# Patient Record
Sex: Female | Born: 1945
Health system: Southern US, Community
[De-identification: ages and names within clinical notes are randomized; demographics above are authoritative.]

## PROBLEM LIST (undated history)

## (undated) DIAGNOSIS — E785 Hyperlipidemia, unspecified: Secondary | ICD-10-CM

## (undated) DIAGNOSIS — H3320 Serous retinal detachment, unspecified eye: Secondary | ICD-10-CM

## (undated) DIAGNOSIS — Z972 Presence of dental prosthetic device (complete) (partial): Secondary | ICD-10-CM

## (undated) DIAGNOSIS — R413 Other amnesia: Secondary | ICD-10-CM

## (undated) DIAGNOSIS — M810 Age-related osteoporosis without current pathological fracture: Secondary | ICD-10-CM

## (undated) DIAGNOSIS — M720 Palmar fascial fibromatosis [Dupuytren]: Secondary | ICD-10-CM

## (undated) DIAGNOSIS — E119 Type 2 diabetes mellitus without complications: Secondary | ICD-10-CM

## (undated) DIAGNOSIS — F329 Major depressive disorder, single episode, unspecified: Secondary | ICD-10-CM

## (undated) HISTORY — PX: CATARACT EXTRACTION: SUR2

## (undated) HISTORY — PX: ABDOMINAL HYSTERECTOMY: SHX81

## (undated) HISTORY — DX: Serous retinal detachment, unspecified eye: H33.20

## (undated) HISTORY — PX: TONSILLECTOMY: SUR1361

## (undated) HISTORY — PX: TUBAL LIGATION: SHX77

## (undated) HISTORY — PX: COSMETIC SURGERY: SHX468

## (undated) HISTORY — PX: PALMAR FASCIECTOMY: SUR599

## (undated) HISTORY — PX: IM NAILING TIBIA: SUR734

## (undated) HISTORY — PX: OTHER SURGICAL HISTORY: SHX169

## (undated) HISTORY — PX: EYE SURGERY: SHX253

---

## 2004-05-03 ENCOUNTER — Ambulatory Visit: Payer: Self-pay | Admitting: General Practice

## 2004-08-07 ENCOUNTER — Ambulatory Visit: Payer: Self-pay | Admitting: Family Medicine

## 2004-09-20 ENCOUNTER — Ambulatory Visit: Payer: Self-pay | Admitting: Unknown Physician Specialty

## 2006-07-13 ENCOUNTER — Ambulatory Visit: Payer: Self-pay | Admitting: Family Medicine

## 2007-07-15 ENCOUNTER — Ambulatory Visit: Payer: Self-pay | Admitting: Family Medicine

## 2008-08-15 ENCOUNTER — Ambulatory Visit: Payer: Self-pay | Admitting: Family Medicine

## 2009-07-07 HISTORY — PX: BREAST BIOPSY: SHX20

## 2009-08-16 ENCOUNTER — Ambulatory Visit: Payer: Self-pay | Admitting: Family Medicine

## 2010-11-05 ENCOUNTER — Observation Stay (HOSPITAL_COMMUNITY)
Admission: RE | Admit: 2010-11-05 | Discharge: 2010-11-06 | Disposition: A | Discharge status: Home / Self Care | Payer: BC Managed Care – PPO | Source: Ambulatory Visit | Attending: Ophthalmology | Admitting: Ophthalmology

## 2010-11-05 DIAGNOSIS — H33009 Unspecified retinal detachment with retinal break, unspecified eye: Principal | ICD-10-CM | POA: Insufficient documentation

## 2010-11-05 LAB — CBC
HCT: 42.7 % (ref 36.0–46.0)
Hemoglobin: 14 g/dL (ref 12.0–15.0)
MCH: 30.8 pg (ref 26.0–34.0)
MCHC: 32.8 g/dL (ref 30.0–36.0)

## 2010-11-05 LAB — BASIC METABOLIC PANEL
CO2: 31 mEq/L (ref 19–32)
Calcium: 9.2 mg/dL (ref 8.4–10.5)
Chloride: 106 mEq/L (ref 96–112)
Glucose, Bld: 119 mg/dL — ABNORMAL HIGH (ref 70–99)
Sodium: 142 mEq/L (ref 135–145)

## 2010-11-08 NOTE — Op Note (Signed)
  Terry Watson, Terry Watson               ACCOUNT NO.:  [id number]  MEDICAL RECORD NO.:  [id number]           PATIENT TYPE:  O  LOCATION:  [facility]                         FACILITY:  [facility]  PHYSICIAN:  Afton Lavalle D. Ashley Royalty, M.D. DATE OF BIRTH:  1946/06/04  DATE OF PROCEDURE:  11/05/2010 DATE OF DISCHARGE:                              OPERATIVE REPORT   ADMISSION DIAGNOSIS:  Rhegmatogenous retinal detachment in the right eye.  PROCEDURE:  Scleral buckle right eye, retinal photocoagulation right eye, gas fluid exchange right eye.  SURGEON:  Beulah Gandy. Ashley Royalty, MD  ASSISTANT:  Rosalie Doctor, MA  ANESTHESIA:  General.  DETAILS:  After usual prep and drape, 360 degree limbal peritomy isolation of four rectus muscles on 2-0 silk, localization of break at 2:30 o'clock, scleral dissection for 360 degrees to admit a #279 intrascleral implant.  Diathermy placed in the bed, 270 buckle was placed around the eye with a 508 G radial segment at 2 o'clock, 240 band placed around the eye with a 270 sleeve at 10 o'clock.  The buckle was jointed at 4 o'clock in the area where there was minimal detachment. Perforation site was chosen in the posterior aspect of the bed at 1 o'clock.  A large amount of clear colorless subretinal fluid came forth in a controlled manner.  C3F8 50% was injected into the vitreous cavity, as the fluid egressed, 1.2 mL was injected.  The buckle elements were placed and the band was adjusted.  Indirect ophthalmoscopy showed the retina to be lying nicely on the scleral buckle with minimal subretinal fluid remaining.  The indirect ophthalmoscope laser was moved into place, 1385 burns were placed around the retinal periphery with a power of 500 milliwatts, 1000 microns each at 0.1 seconds each.  The buckle was adjusted and trimmed.  The band was adjusted and trimmed.  Scleral sutures were knotted and the free ends removed.  The conjunctiva was reapproximated with 7-0 chromic suture.   Polymyxin and gentamicin were irrigated into [place] space.  Atropine solution was applied.  Marcaine was injected around the globe for postop pain.  Paracentesis x2 obtained a closing pressure of 15 with a Barraquer tonometer, Decadron 10 mg was injected into the lower subconjunctival space.  Polysporin ophthalmic ointment, a patch and shield were placed.  COMPLICATIONS:  None.  DURATION:  Two hours.  The patient was awakened and taken to recovery in satisfactory condition.     Beulah Gandy. Ashley Royalty, M.D.     JDM/MEDQ  D:  11/05/2010  T:  11/06/2010  Job:  [id number]  Electronically Signed by Alan Mulder M.D. on 11/08/2010 10:26:01 PM

## 2010-11-13 ENCOUNTER — Ambulatory Visit: Payer: Self-pay | Admitting: Family Medicine

## 2010-11-15 ENCOUNTER — Ambulatory Visit: Payer: Self-pay | Admitting: Family Medicine

## 2011-02-12 ENCOUNTER — Encounter (INDEPENDENT_AMBULATORY_CARE_PROVIDER_SITE_OTHER): Payer: BC Managed Care – PPO | Admitting: Ophthalmology

## 2011-02-12 DIAGNOSIS — H2510 Age-related nuclear cataract, unspecified eye: Secondary | ICD-10-CM

## 2011-02-12 DIAGNOSIS — H33009 Unspecified retinal detachment with retinal break, unspecified eye: Secondary | ICD-10-CM

## 2011-02-12 DIAGNOSIS — H43819 Vitreous degeneration, unspecified eye: Secondary | ICD-10-CM

## 2011-05-21 ENCOUNTER — Ambulatory Visit: Payer: Self-pay | Admitting: General Surgery

## 2011-06-18 ENCOUNTER — Encounter (INDEPENDENT_AMBULATORY_CARE_PROVIDER_SITE_OTHER): Payer: BC Managed Care – PPO | Admitting: Ophthalmology

## 2011-06-18 DIAGNOSIS — H43819 Vitreous degeneration, unspecified eye: Secondary | ICD-10-CM

## 2011-06-18 DIAGNOSIS — H2510 Age-related nuclear cataract, unspecified eye: Secondary | ICD-10-CM

## 2011-06-18 DIAGNOSIS — H3530 Unspecified macular degeneration: Secondary | ICD-10-CM

## 2011-06-18 DIAGNOSIS — H33009 Unspecified retinal detachment with retinal break, unspecified eye: Secondary | ICD-10-CM

## 2011-06-18 DIAGNOSIS — H35359 Cystoid macular degeneration, unspecified eye: Secondary | ICD-10-CM

## 2011-07-30 ENCOUNTER — Encounter (INDEPENDENT_AMBULATORY_CARE_PROVIDER_SITE_OTHER): Payer: BC Managed Care – PPO | Admitting: Ophthalmology

## 2011-07-30 DIAGNOSIS — H33009 Unspecified retinal detachment with retinal break, unspecified eye: Secondary | ICD-10-CM

## 2011-07-30 DIAGNOSIS — H35359 Cystoid macular degeneration, unspecified eye: Secondary | ICD-10-CM

## 2011-07-30 DIAGNOSIS — H43819 Vitreous degeneration, unspecified eye: Secondary | ICD-10-CM

## 2011-07-30 DIAGNOSIS — H2510 Age-related nuclear cataract, unspecified eye: Secondary | ICD-10-CM

## 2011-07-30 DIAGNOSIS — H35379 Puckering of macula, unspecified eye: Secondary | ICD-10-CM

## 2011-08-13 ENCOUNTER — Ambulatory Visit (INDEPENDENT_AMBULATORY_CARE_PROVIDER_SITE_OTHER): Payer: BC Managed Care – PPO | Admitting: Ophthalmology

## 2011-09-29 ENCOUNTER — Encounter (INDEPENDENT_AMBULATORY_CARE_PROVIDER_SITE_OTHER): Payer: BC Managed Care – PPO | Admitting: Ophthalmology

## 2011-09-29 DIAGNOSIS — H35359 Cystoid macular degeneration, unspecified eye: Secondary | ICD-10-CM

## 2011-09-29 DIAGNOSIS — H33009 Unspecified retinal detachment with retinal break, unspecified eye: Secondary | ICD-10-CM

## 2011-09-29 DIAGNOSIS — H2510 Age-related nuclear cataract, unspecified eye: Secondary | ICD-10-CM

## 2011-09-29 DIAGNOSIS — H43819 Vitreous degeneration, unspecified eye: Secondary | ICD-10-CM

## 2012-01-16 ENCOUNTER — Ambulatory Visit: Payer: Self-pay | Admitting: Family Medicine

## 2012-01-20 ENCOUNTER — Ambulatory Visit: Payer: Self-pay | Admitting: Family Medicine

## 2012-02-05 HISTORY — PX: BREAST CYST ASPIRATION: SHX578

## 2012-03-01 ENCOUNTER — Encounter (INDEPENDENT_AMBULATORY_CARE_PROVIDER_SITE_OTHER): Payer: BC Managed Care – PPO | Admitting: Ophthalmology

## 2012-03-01 DIAGNOSIS — H35379 Puckering of macula, unspecified eye: Secondary | ICD-10-CM

## 2012-03-01 DIAGNOSIS — H43819 Vitreous degeneration, unspecified eye: Secondary | ICD-10-CM

## 2012-03-01 DIAGNOSIS — H2510 Age-related nuclear cataract, unspecified eye: Secondary | ICD-10-CM

## 2012-03-01 DIAGNOSIS — H35359 Cystoid macular degeneration, unspecified eye: Secondary | ICD-10-CM

## 2012-03-01 DIAGNOSIS — H33009 Unspecified retinal detachment with retinal break, unspecified eye: Secondary | ICD-10-CM

## 2012-05-05 DIAGNOSIS — M720 Palmar fascial fibromatosis [Dupuytren]: Secondary | ICD-10-CM | POA: Insufficient documentation

## 2012-08-02 DIAGNOSIS — E785 Hyperlipidemia, unspecified: Secondary | ICD-10-CM | POA: Insufficient documentation

## 2012-09-12 IMAGING — MG MAM DGTL SCREENING MAMMO W/CAD
1 series · 4 of 4 positions shown · non-contrast
Comparison: none

REASON FOR EXAM: SCR
COMMENTS:

[R CC · right · 4 of 4 slices shown]
[im 1/4]
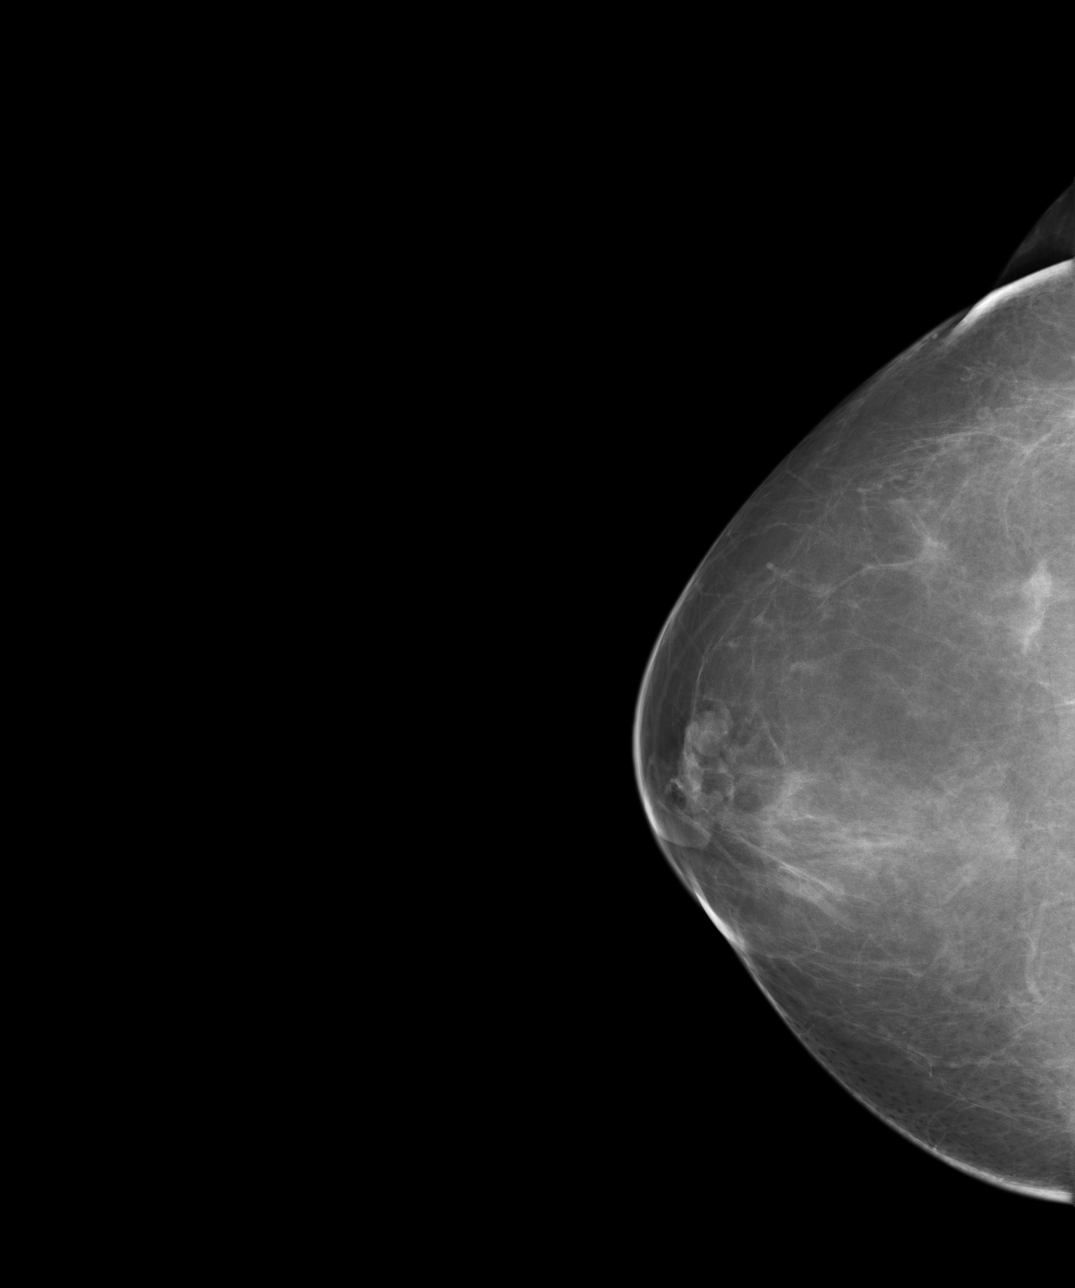
[im 2/4]
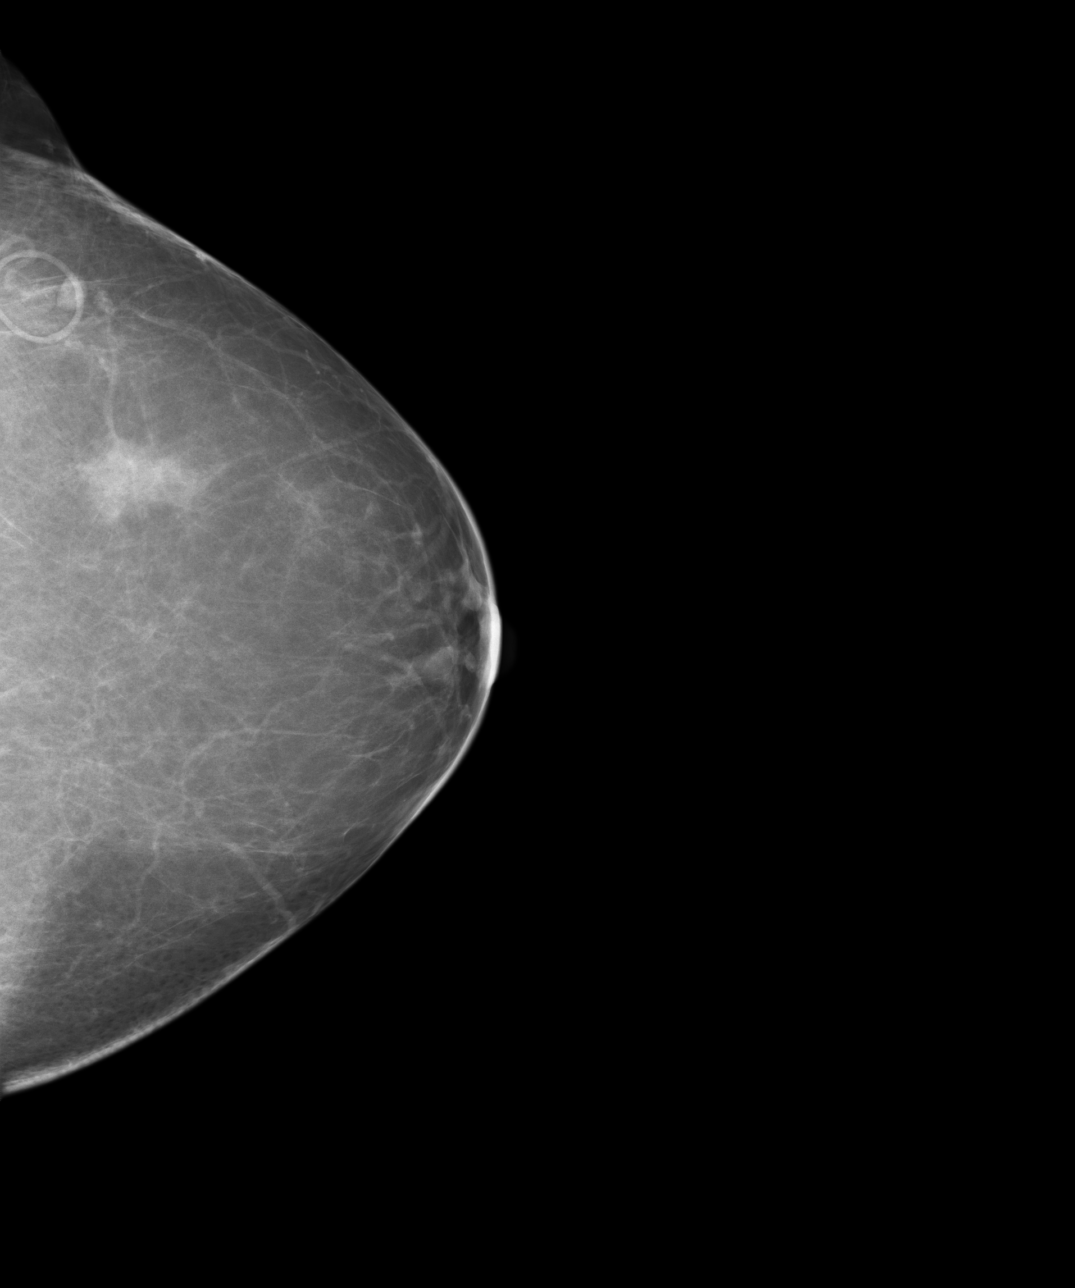
[im 3/4]
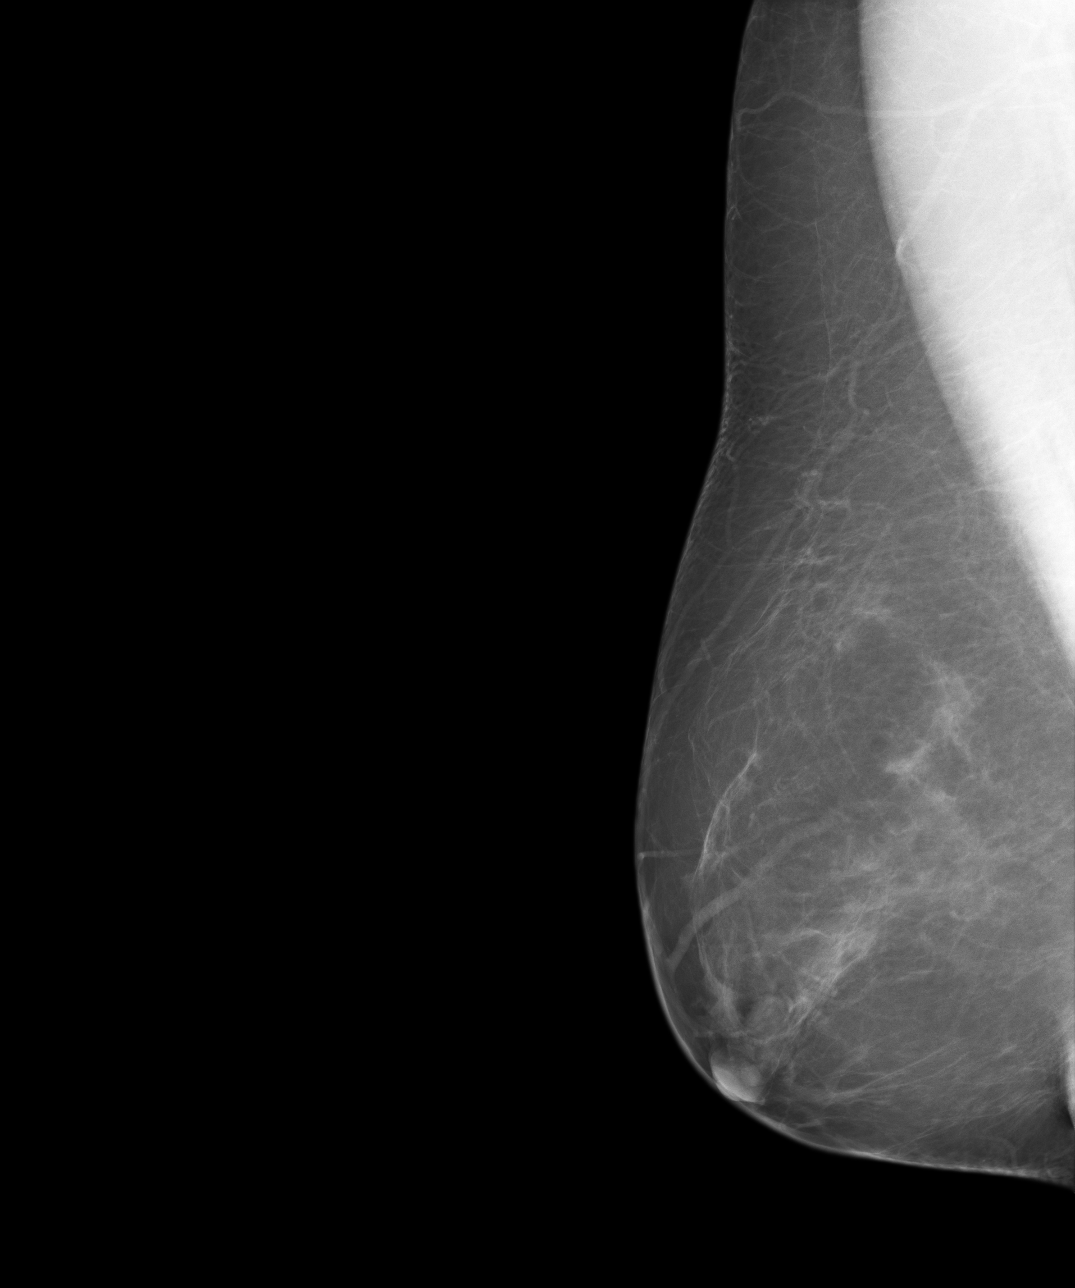
[im 4/4]
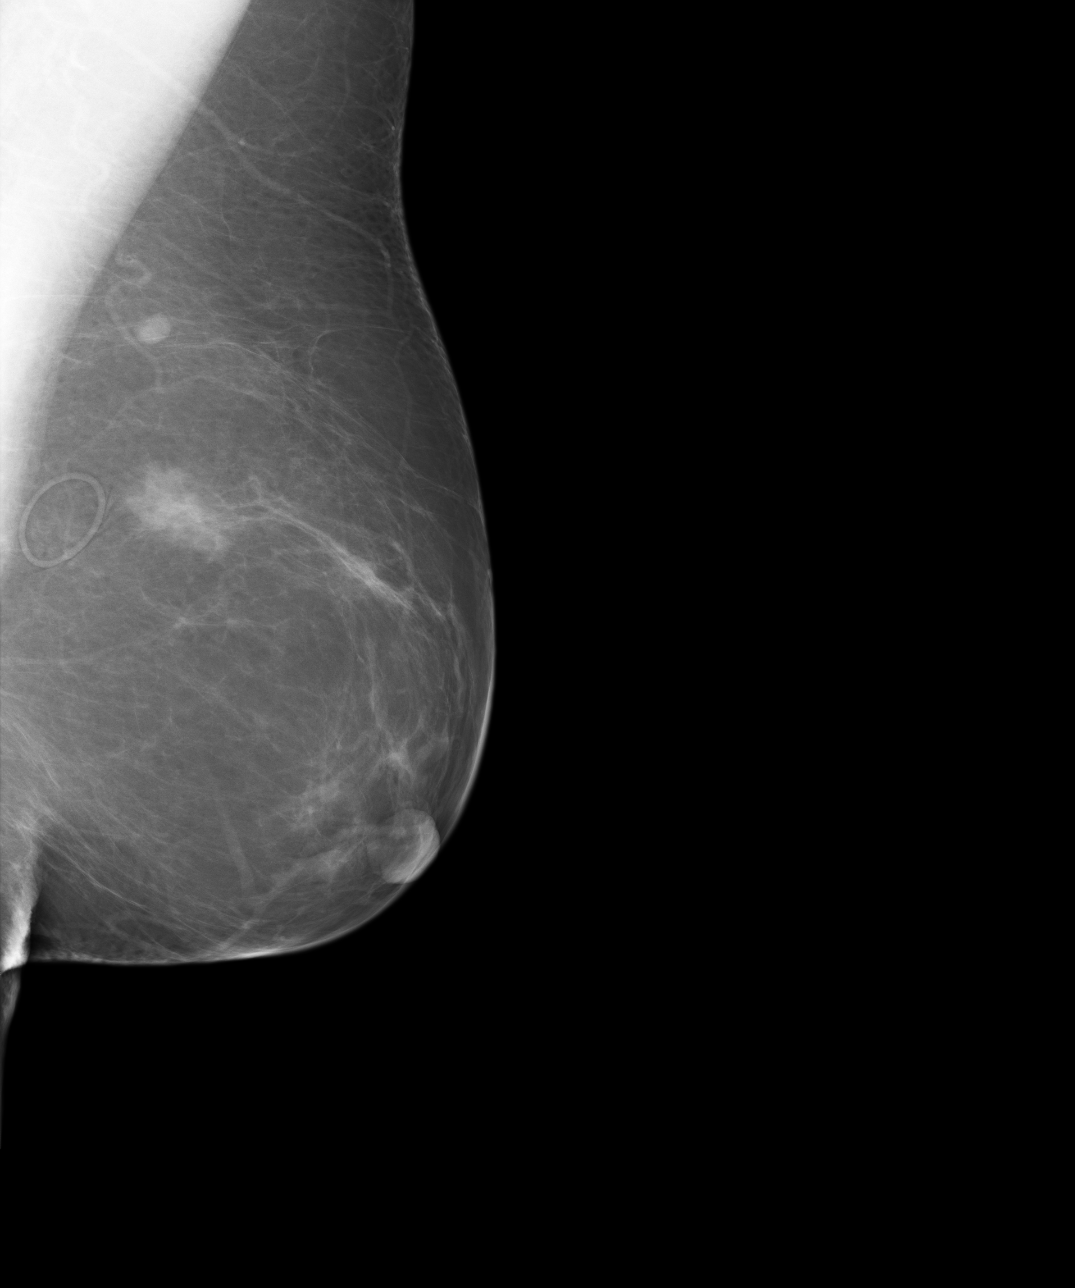

[4 of 4 positions shown; findings below may reference images not displayed]

PROCEDURE:     MAM - MAM DGTL SCREENING MAMMO W/CAD  - November 13, 2010  [DATE]

RESULT:     Changes of scarring are noted in the upper outer aspect of the
left breast. Nodular density is noted in the retroareolar portion of the
right breast. Compression spot films and ultrasound of the retroareolar
portion of the right breast is suggested for further evaluation. No
pathologic calcifications noted.
IMPRESSION: 1.      Rounded nodular density noted in the retroareolar portion of the
right breast for which compression spot films and, if need be, ultrasound
suggested for further evaluation.
2.     Changes consistent with scarring noted in the left breast.

BI-RADS:  Category 0 - Needs Additional Imaging Evaluation

Thank you for this opportunity to contribute to the care of your patient.

A NEGATIVE MAMMOGRAM REPORT DOES NOT PRECLUDE BIOPSY OR OTHER EVALUATION OF
A CLINICALLY PALPABLE OR OTHERWISE SUSPICIOUS MASS OR LESION. BREAST CANCER
MAY NOT BE DETECTED BY MAMMOGRAPHY IN UP TO 10% OF CASES.

## 2012-09-14 IMAGING — US ULTRASOUND RIGHT BREAST
1 series · 17 of 25 positions shown · non-contrast
Comparison: none

REASON FOR EXAM: AV RT ROUNDED NODULAR DENSITY
COMMENTS:

[Series 1: ultrasound right breast · 17 of 25 slices shown]
[im 1/25]
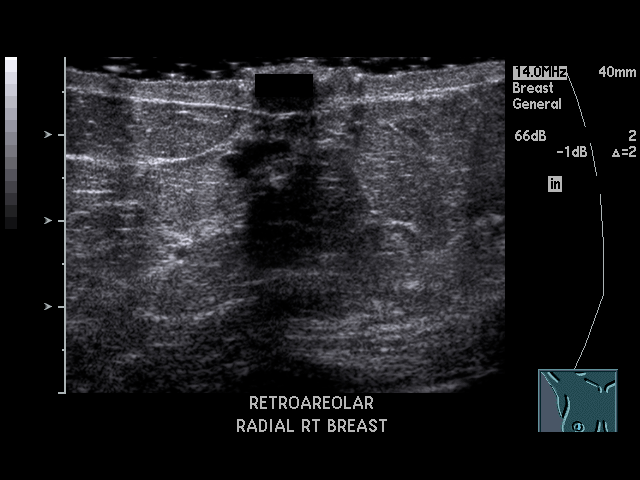
[im 3/25]
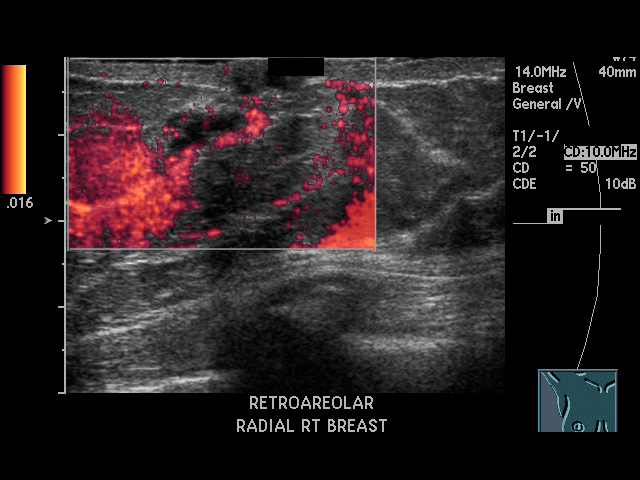
[im 4/25]
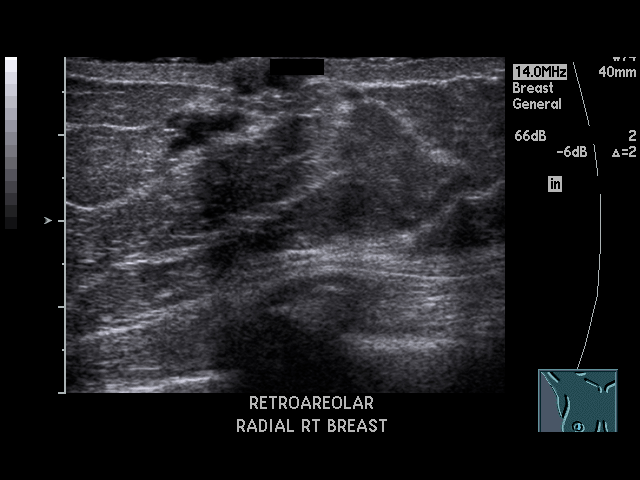
[im 6/25]
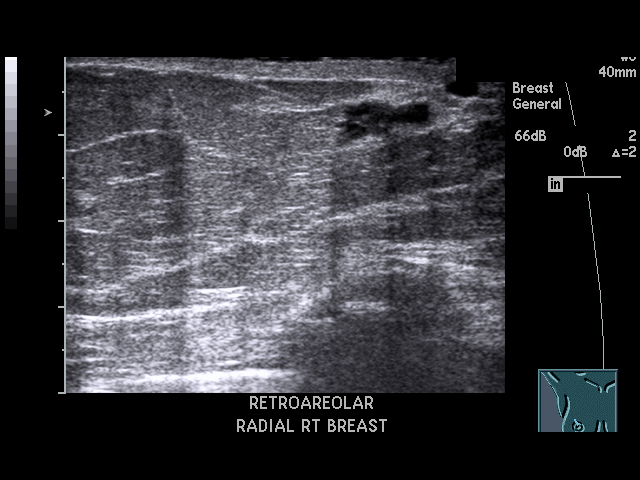
[im 7/25]
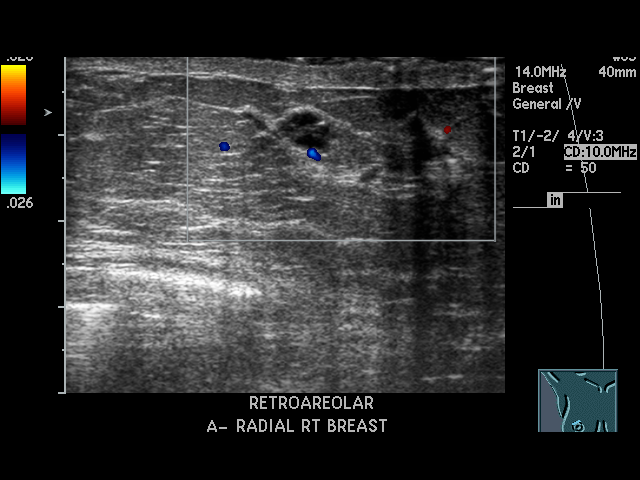
[im 9/25]
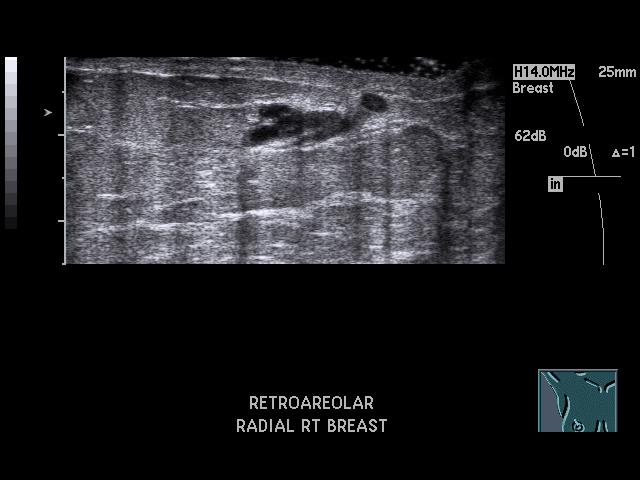
[im 10/25]
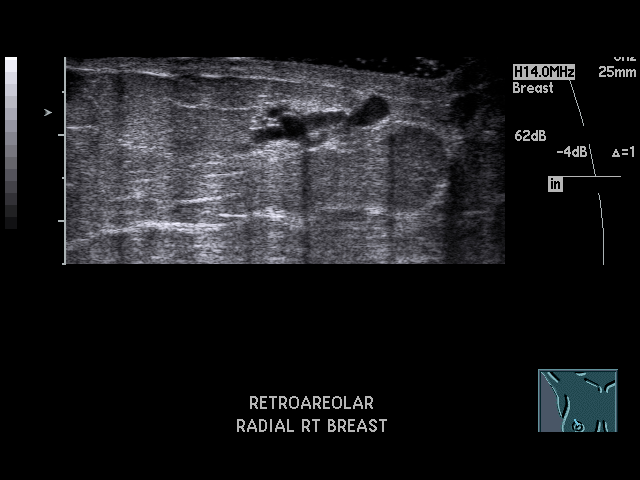
[im 12/25]
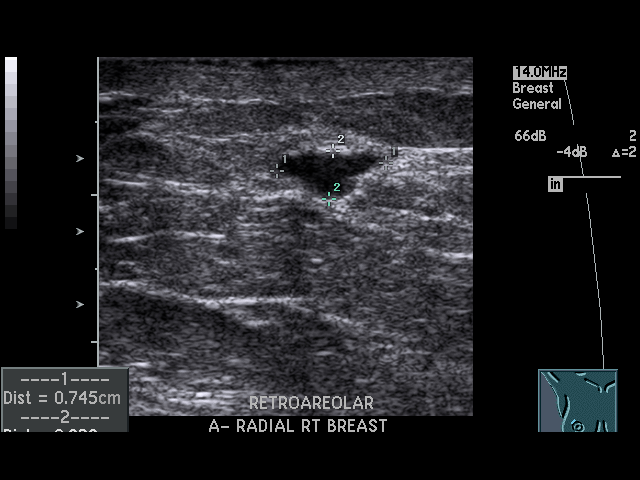
[im 13/25]
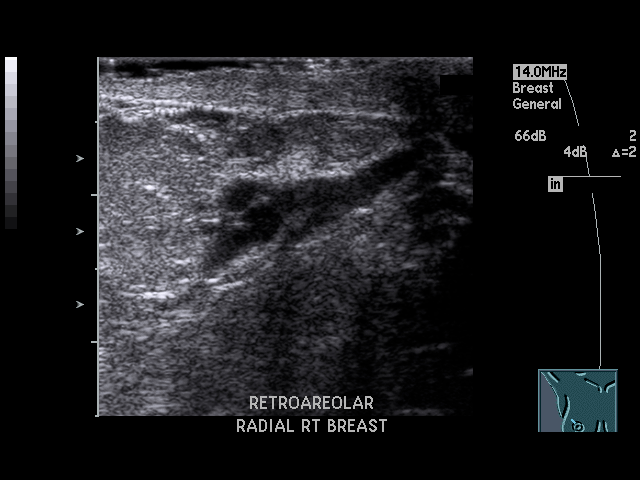
[im 14/25]
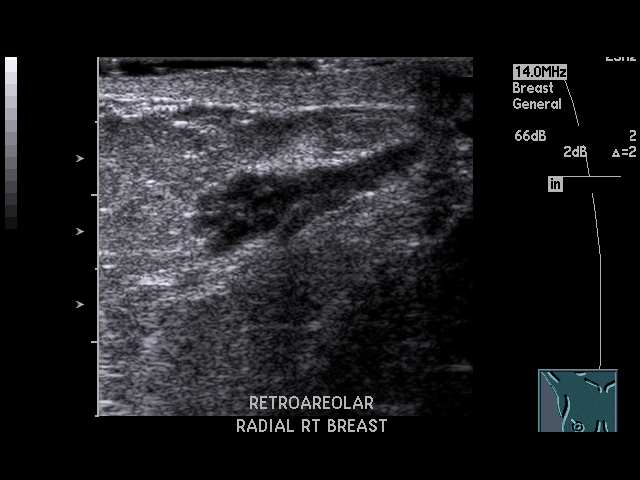
[im 16/25]
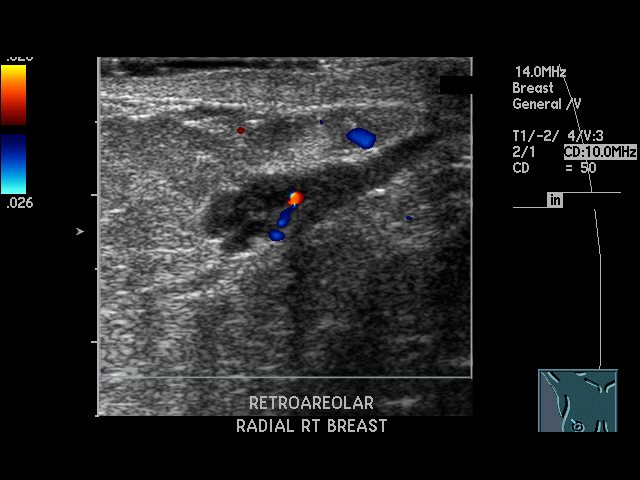
[im 17/25]
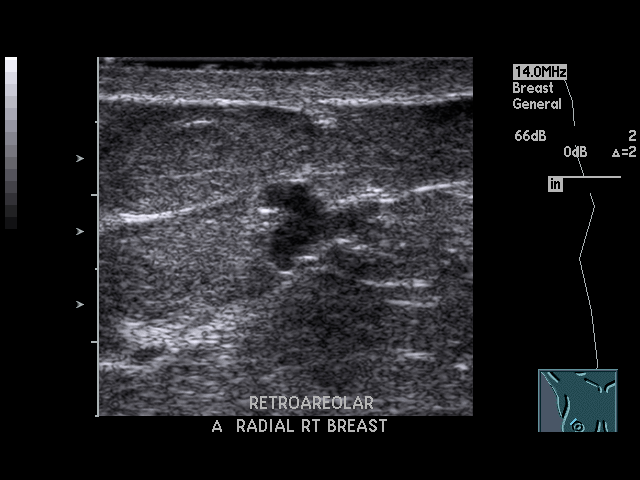
[im 19/25]
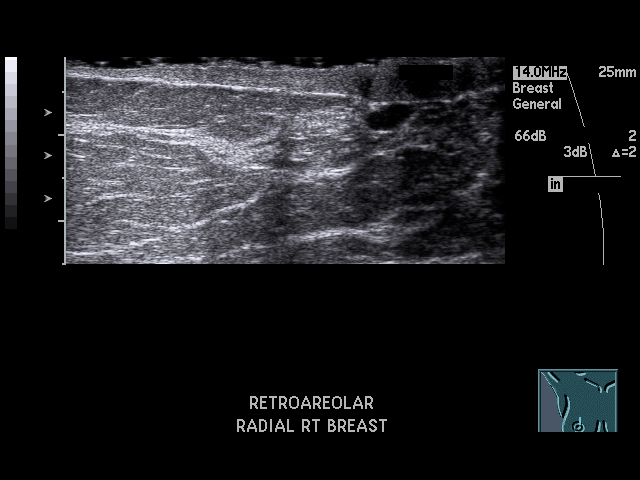
[im 20/25]
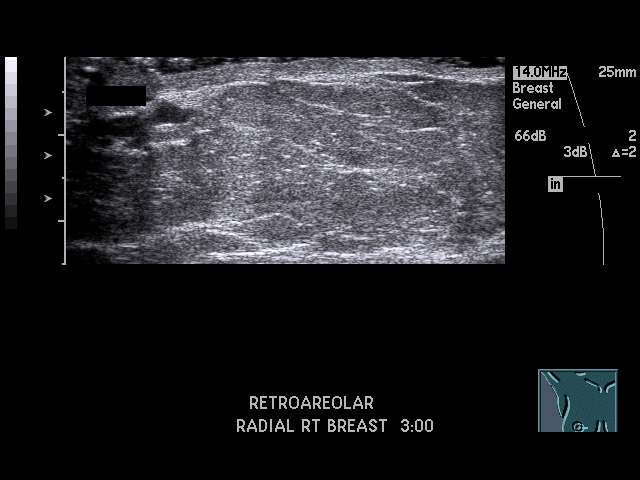
[im 22/25]
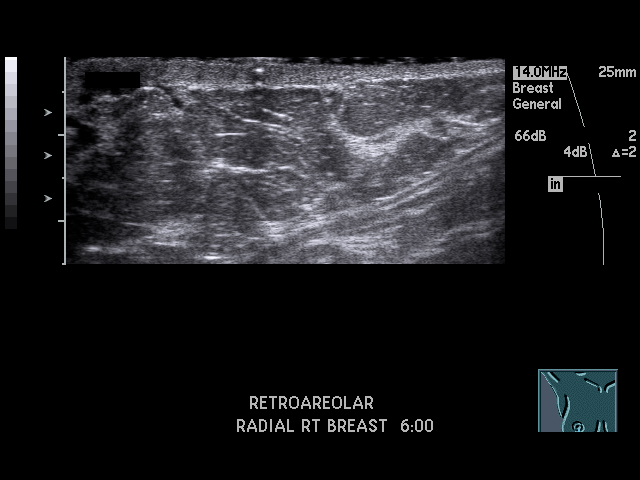
[im 23/25]
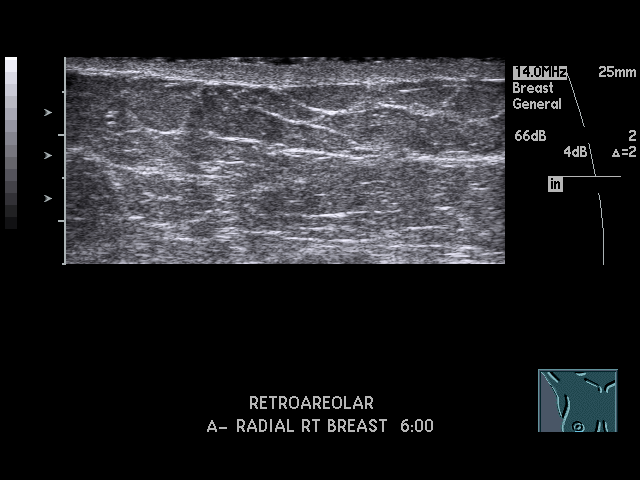
[im 25/25]
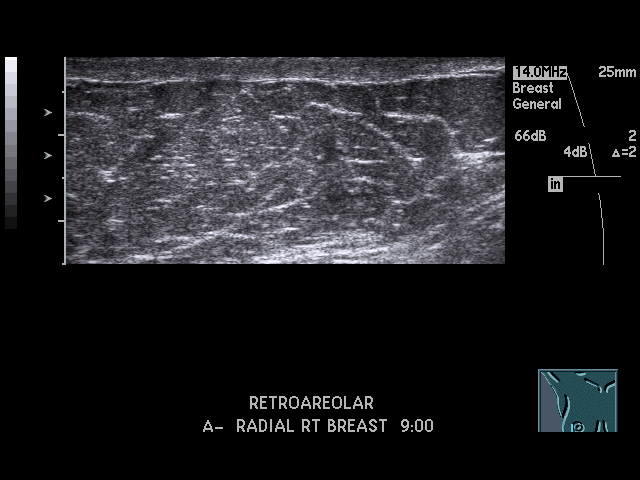

[17 of 25 positions shown; findings below may reference images not displayed]

PROCEDURE:     US  - US BREAST RIGHT  - November 15, 2010 [DATE]

RESULT:     Prominent ductal ectasia is noted in the retroareolar portion of
the right breast. Debris and/or solid material is present within the ducts.
Ductal malignancy in the retroareolar portion of the right breast cannot be
excluded and surgical biopsy should be considered.
IMPRESSION: BI-RADS: Category 4 - Suspicious Abnormality

## 2012-09-14 IMAGING — MG MAM DIG ADDVIEWS RT SCR
1 series · 3 of 3 positions shown · non-contrast
Comparison: none

REASON FOR EXAM: AV RT ROUNDED NODULAR DENSITY
COMMENTS:

[R ML · right · 3 of 3 slices shown]
[im 1/3]
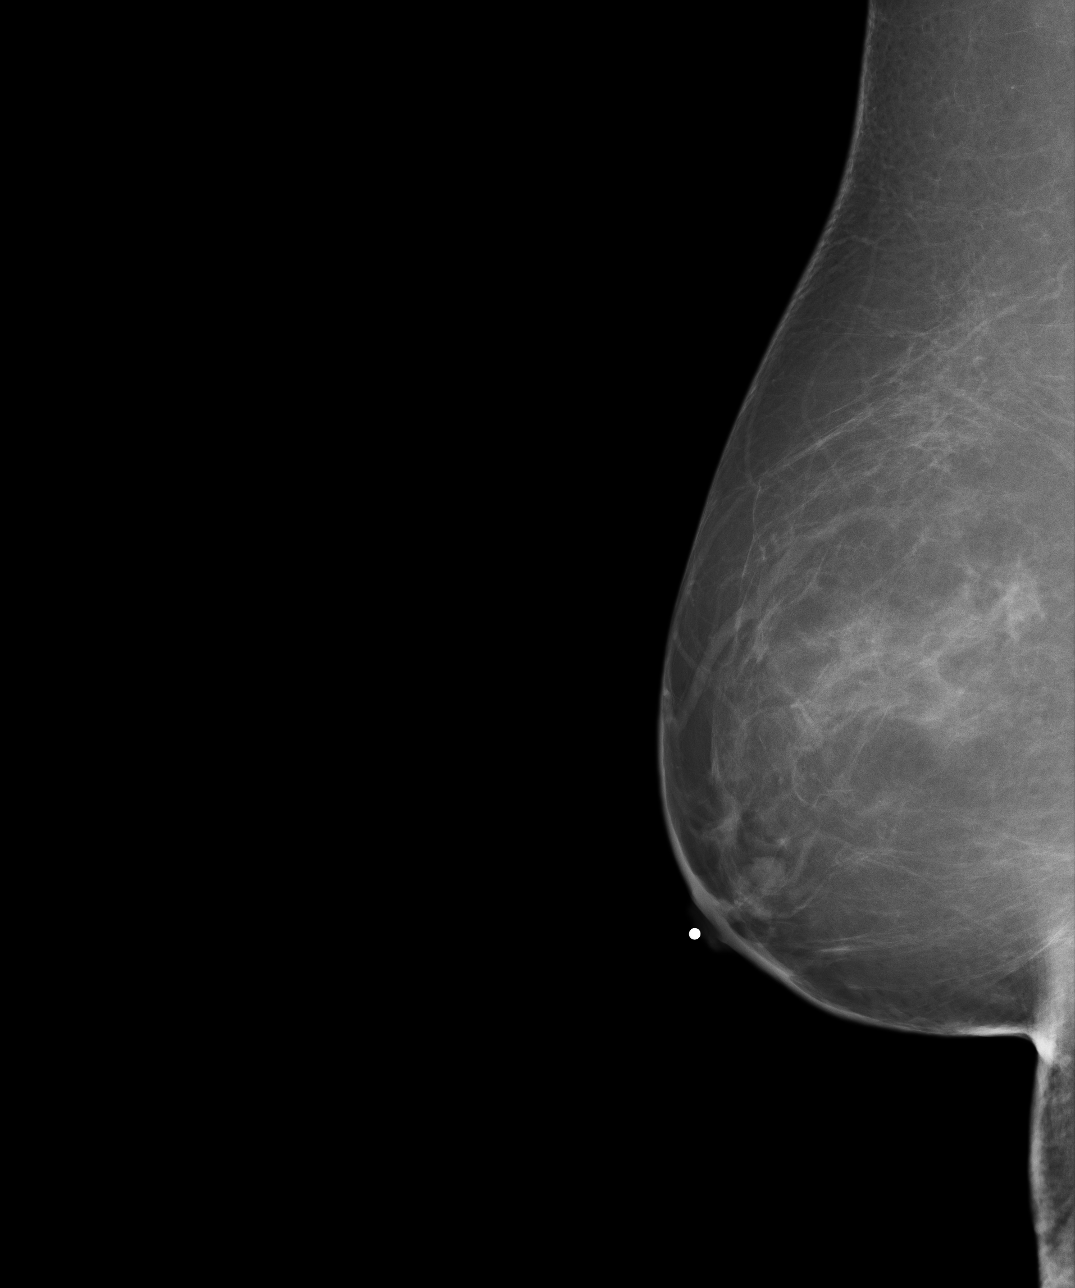
[im 2/3]
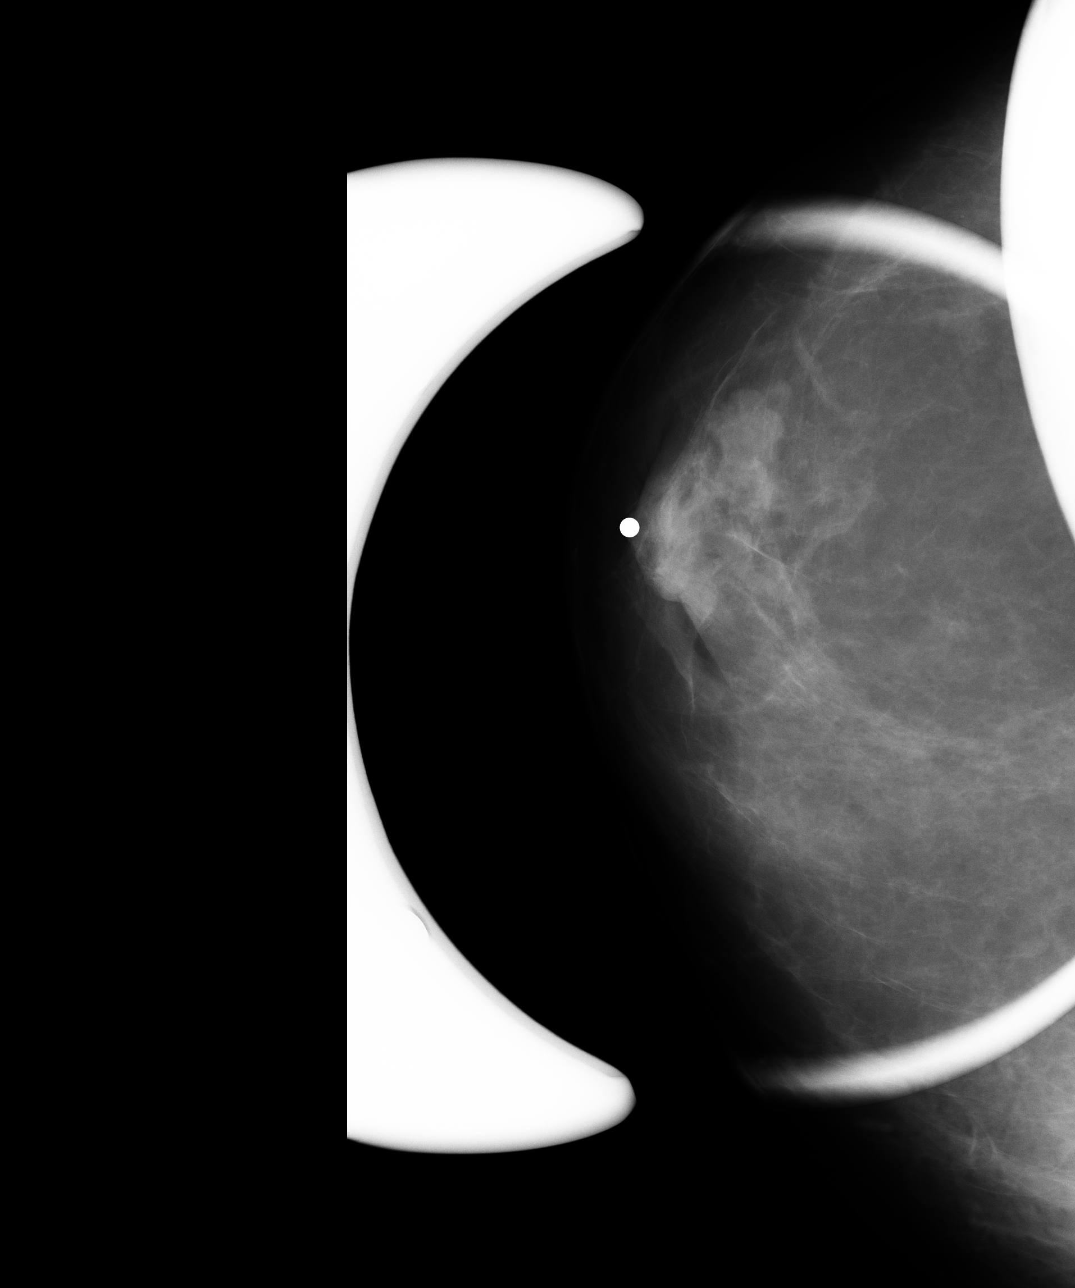
[im 3/3]
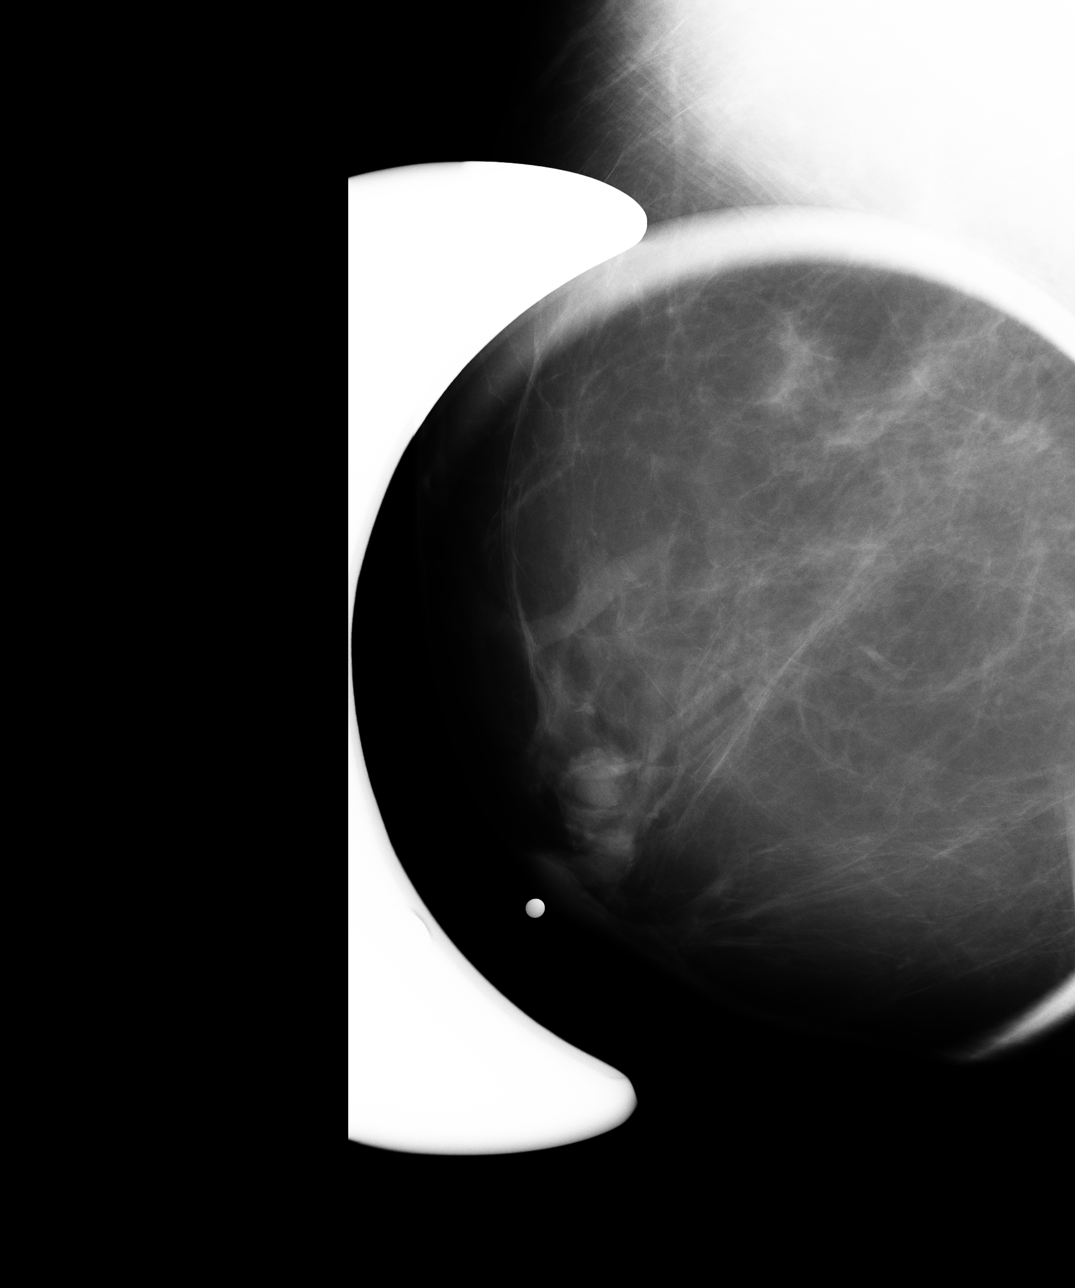

[3 of 3 positions shown; findings below may reference images not displayed]

PROCEDURE:     MAM - MAM DIG ADDVIEWS RT SCR  - November 15, 2010  [DATE]

RESULT:     Retroareolar nodular density is noted on the right. This
persists on compression spot films. Ultrasound was obtained and reveals
ductal ectasia. Portions of the duct contain debris or solid material. It is
suggested that surgical evaluation of the retroareolar portion of the right
breast be obtained as malignancy cannot be excluded.
IMPRESSION: BI-RADS: Category 4 - Suspicious Abnormality

Thank you for this opportunity to contribute to the care of your patient.

A NEGATIVE MAMMOGRAM REPORT DOES NOT PRECLUDE BIOPSY OR OTHER EVALUATION OF
A CLINICALLY PALPABLE OR OTHERWISE SUSPICIOUS MASS OR LESION. BREAST CANCER
MAY NOT BE DETECTED BY MAMMOGRAPHY IN UP TO 10% OF CASES.

## 2013-01-20 ENCOUNTER — Ambulatory Visit: Payer: Self-pay | Admitting: Family Medicine

## 2013-03-02 ENCOUNTER — Ambulatory Visit (INDEPENDENT_AMBULATORY_CARE_PROVIDER_SITE_OTHER): Payer: BC Managed Care – PPO | Admitting: Ophthalmology

## 2013-03-09 ENCOUNTER — Ambulatory Visit (INDEPENDENT_AMBULATORY_CARE_PROVIDER_SITE_OTHER): Payer: Self-pay | Admitting: Ophthalmology

## 2013-03-20 IMAGING — MG MAM DGTL UNI MAM RT BREAST W/CAD
1 series · 3 of 3 positions shown · non-contrast
Comparison: none

REASON FOR EXAM: FU CAT 4
COMMENTS:

[Series 6728: R CC · right · 3 of 3 slices shown]
[im 1/3]
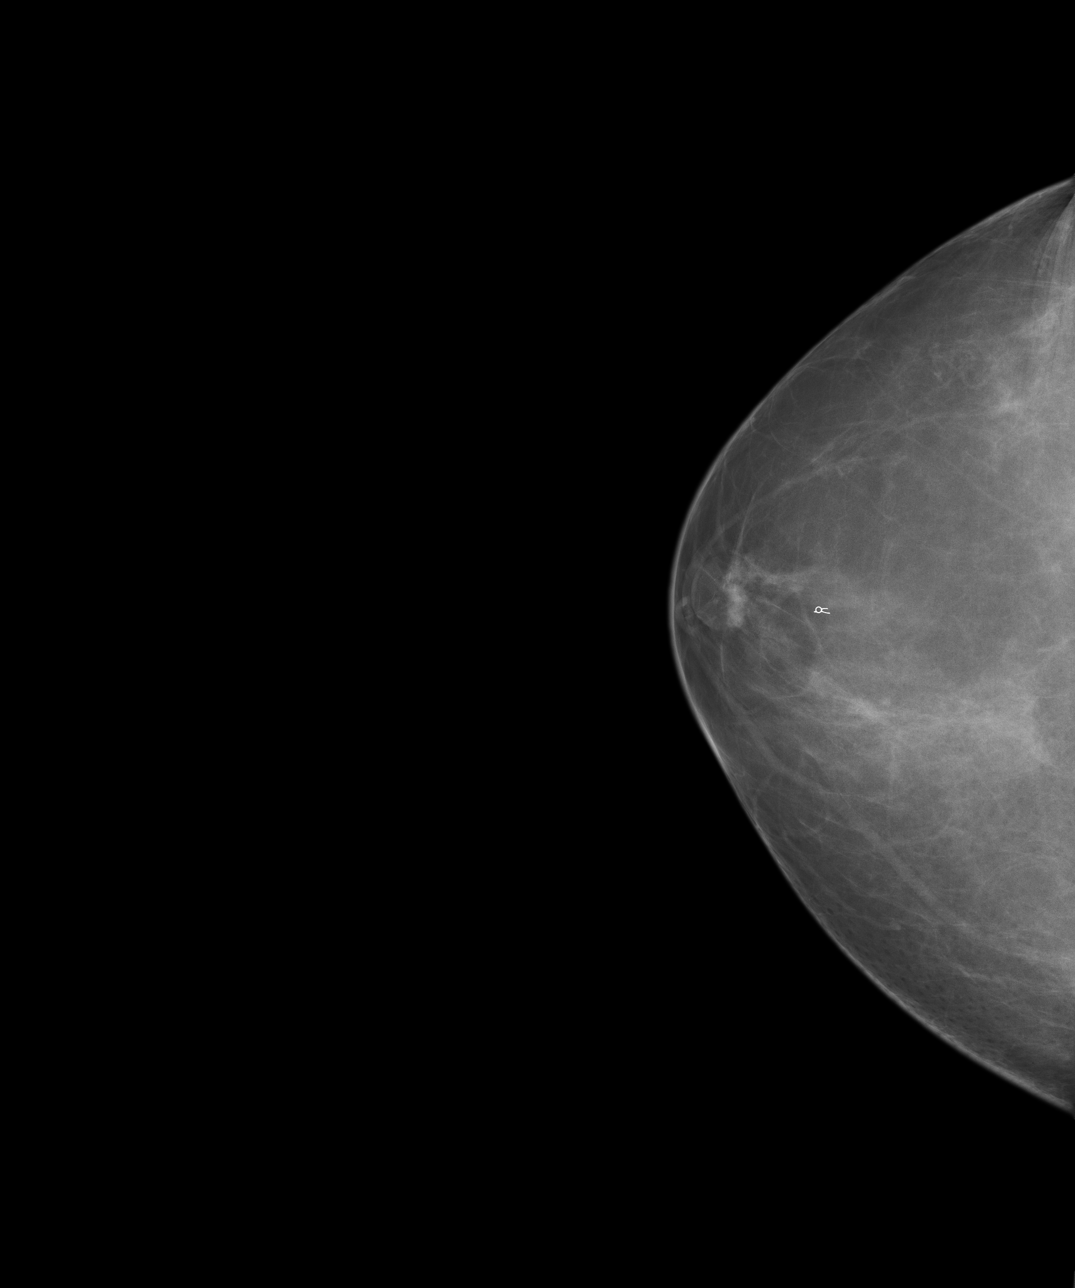
[im 2/3]
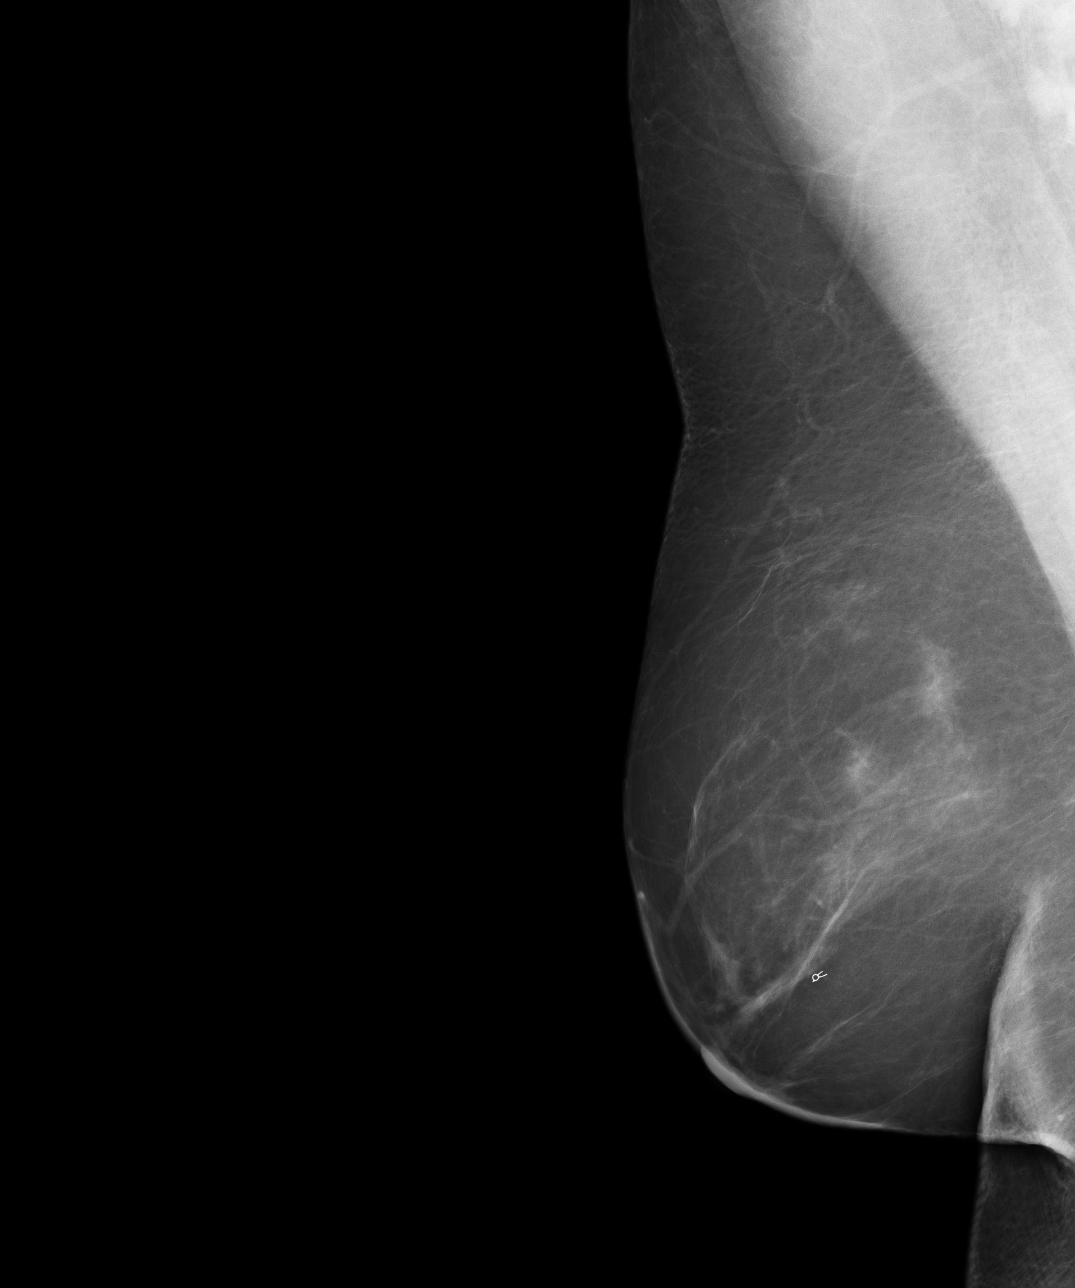
[im 3/3]
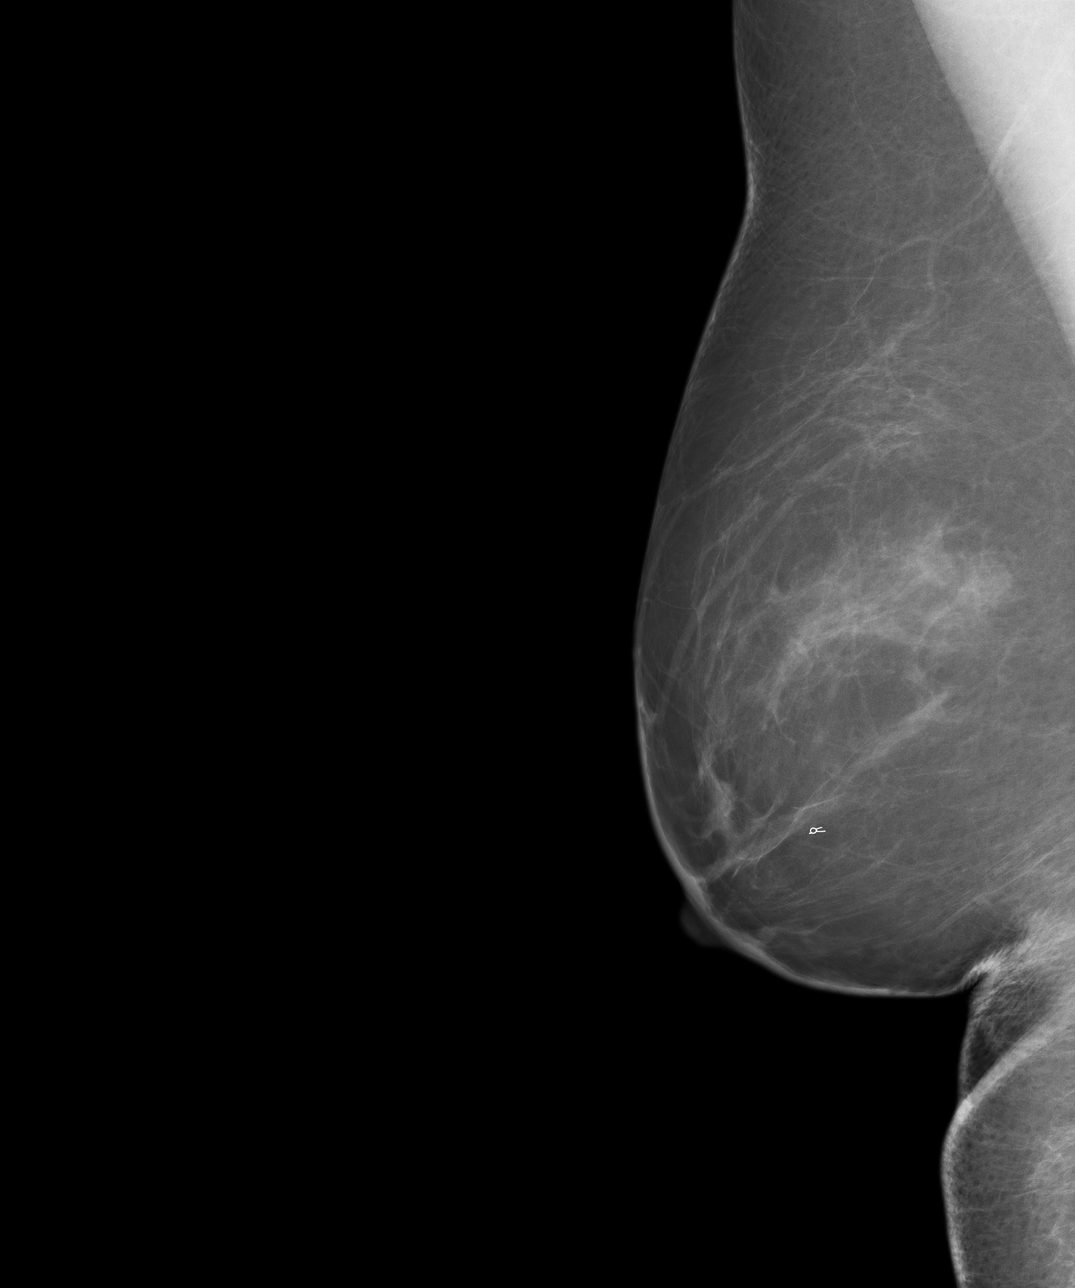

[3 of 3 positions shown; findings below may reference images not displayed]

PROCEDURE:     MAM - MAM DGTL UNI MAM RT BREAST W/CAD  - May 21, 2011  [DATE]

RESULT:     No dominant masses or pathologic clustered calcifications are
demonstrated.  Prominent retroareolar ductal tissue appears to be no longer
present. A stereotactic clip is noted. Tissue adjacent to the clip is
unremarkable.
IMPRESSION: 1.Previously identified prominent retroareolar ductal tissue appears to have
been surgically removed. A stereotactic clip is in place. The patient's
bilateral mammograms are due November 2011.

BI-RADS: Category 3 - Probably Benign Finding (interval follow-up)

A NEGATIVE MAMMOGRAM REPORT DOES NOT PRECLUDE BIOPSY OR OTHER EVALUATION OF
A CLINICALLY PALPABLE OR OTHERWISE SUSPICIOUS MASS OR LESION. BREAST CANCER
MAY NOT BE DETECTED BY MAMMOGRAPHY IN UP TO 10% OF CASES.

## 2013-03-21 ENCOUNTER — Ambulatory Visit (INDEPENDENT_AMBULATORY_CARE_PROVIDER_SITE_OTHER): Payer: Medicare Other | Admitting: Ophthalmology

## 2013-03-21 DIAGNOSIS — D3130 Benign neoplasm of unspecified choroid: Secondary | ICD-10-CM

## 2013-03-21 DIAGNOSIS — H43819 Vitreous degeneration, unspecified eye: Secondary | ICD-10-CM

## 2013-03-21 DIAGNOSIS — H26499 Other secondary cataract, unspecified eye: Secondary | ICD-10-CM

## 2013-03-21 DIAGNOSIS — H33009 Unspecified retinal detachment with retinal break, unspecified eye: Secondary | ICD-10-CM

## 2013-04-18 ENCOUNTER — Encounter (INDEPENDENT_AMBULATORY_CARE_PROVIDER_SITE_OTHER): Payer: Medicare Other | Admitting: Ophthalmology

## 2013-04-18 DIAGNOSIS — H2700 Aphakia, unspecified eye: Secondary | ICD-10-CM

## 2013-11-15 IMAGING — MG MAM DGTL DIAGNOSTIC MAMMO W/CAD
1 series · 7 of 7 positions shown · non-contrast
Comparison: none

REASON FOR EXAM: RT BR FU AND YRLY
COMMENTS:

[Series 9988: R CC · right · 7 of 7 slices shown]
[im 1/7]
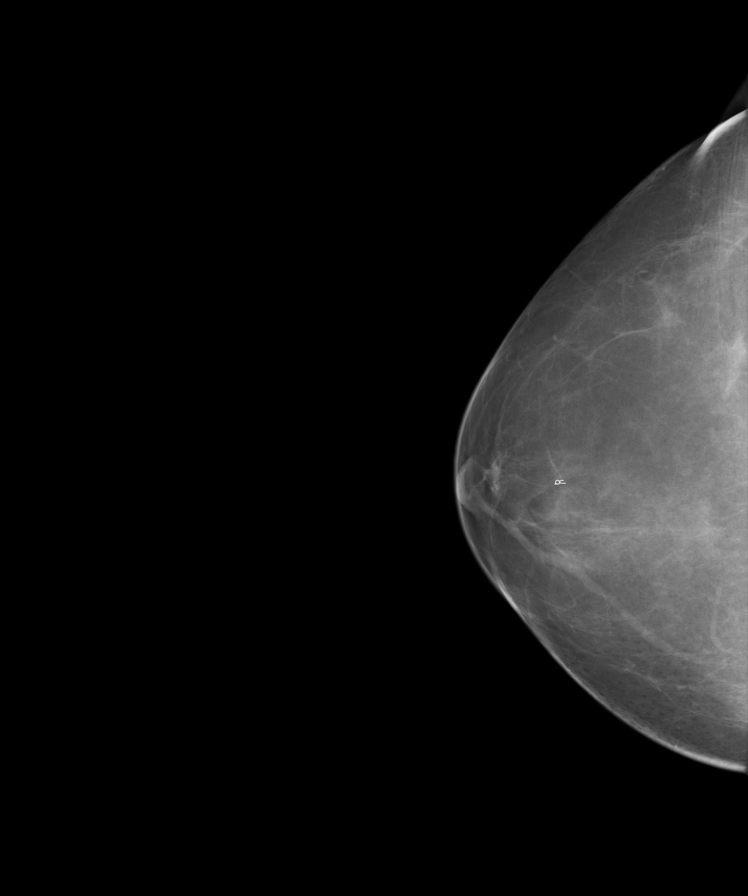
[im 2/7]
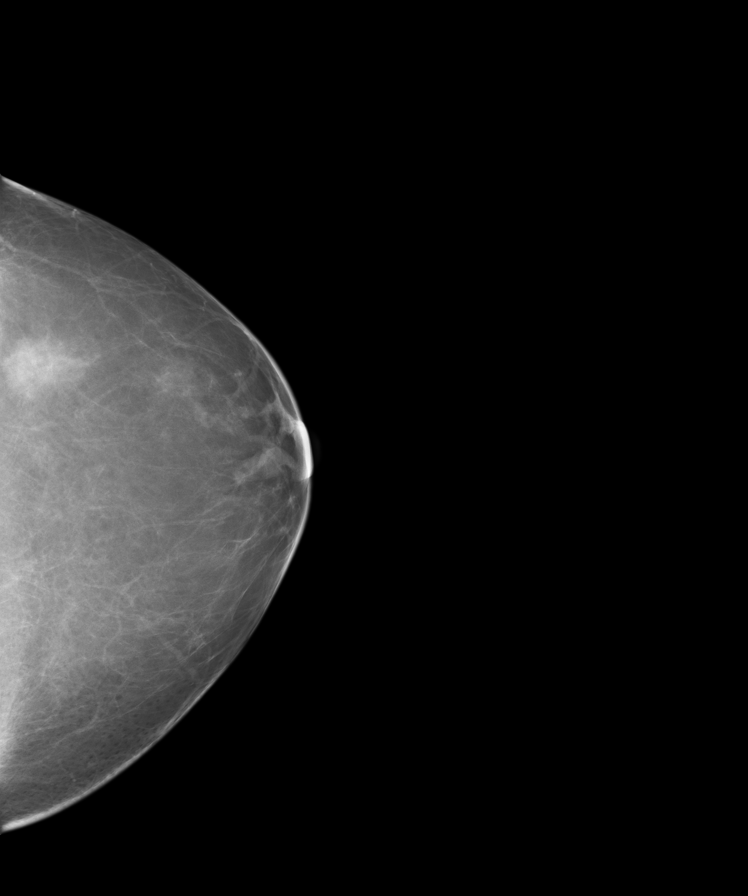
[im 3/7]
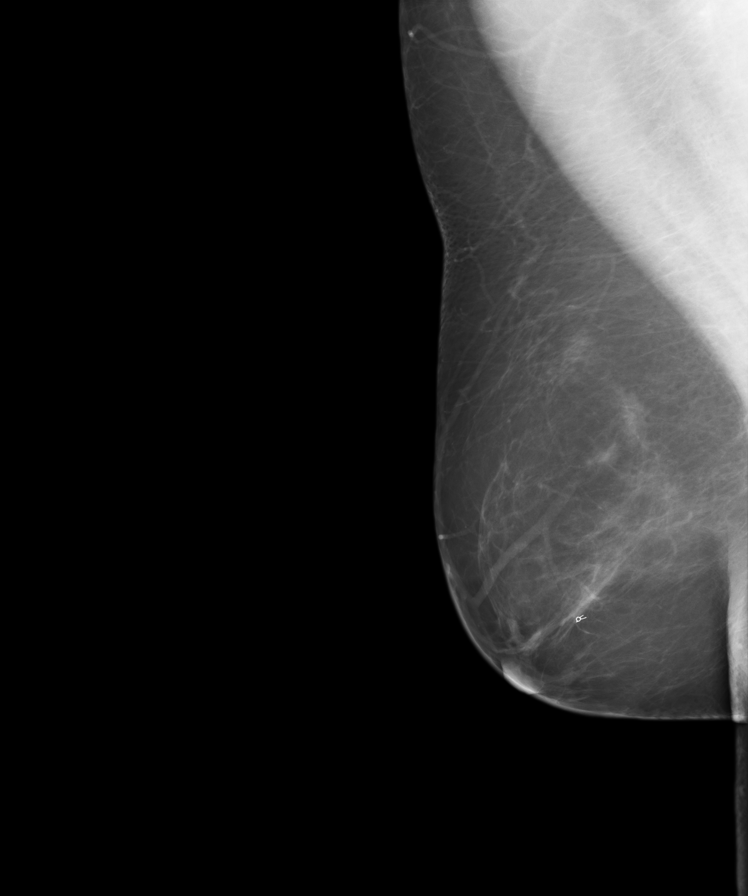
[im 4/7]
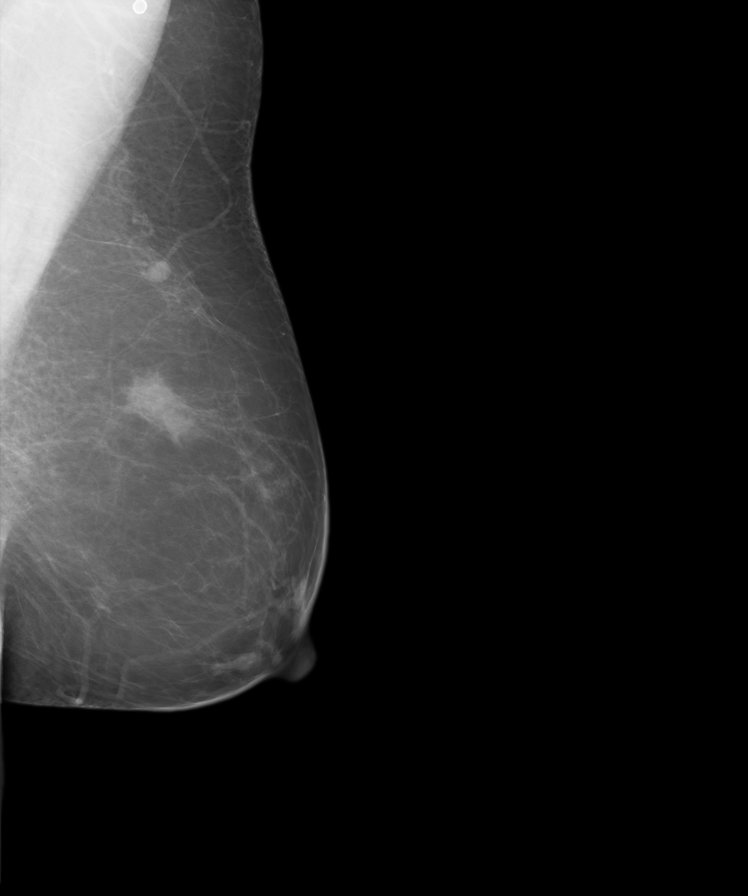
[im 5/7]
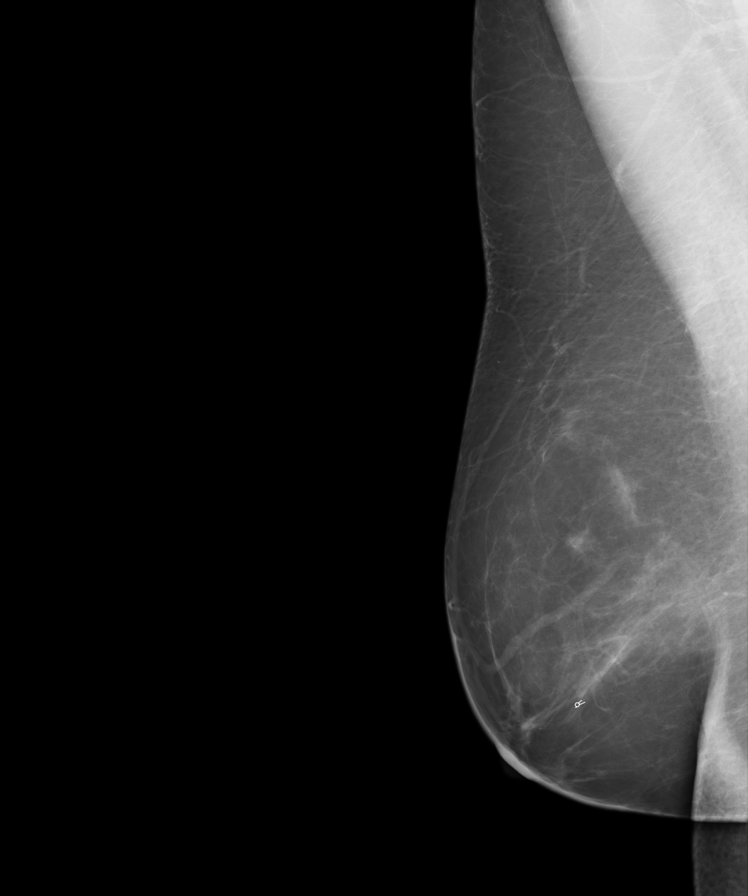
[im 6/7]
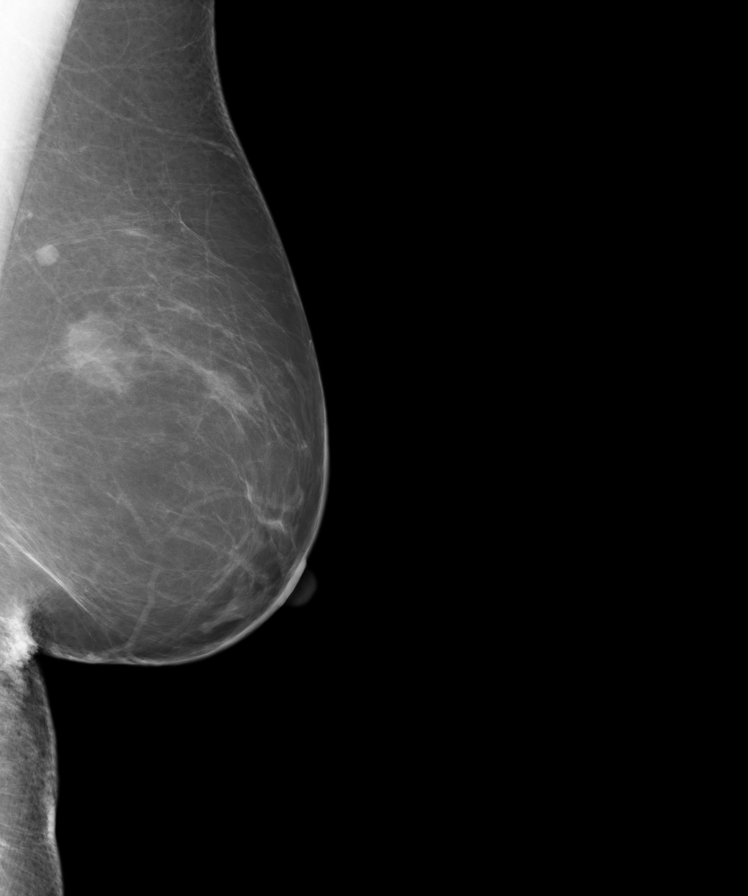
[im 7/7]
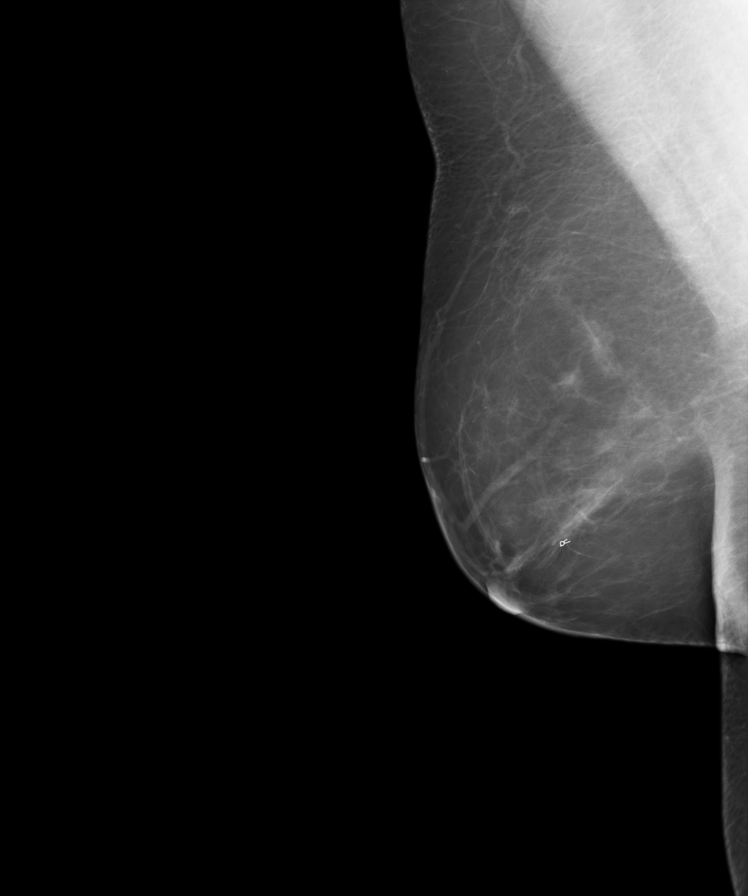

[7 of 7 positions shown; findings below may reference images not displayed]

PROCEDURE:     MAM - MAM DGTL DIAGNOSTIC MAMMO W/CAD  - January 16, 2012  [DATE]

RESULT:      Comparison is made to multiple prior exams.

Parenchymal density noted in the retroareolar portion of the left breast and
is most consistent with a prominent duct.  This is a new finding from prior
exams. Compression spot films and ultrasound is suggested for further
evaluation. The parenchymal pattern is otherwise stable. There is
parenchymal density in the upper aspect of the left breast that is stable.
IMPRESSION: 1.     New retroareolar parenchymal density noted in the left breast is most
likely prominent duct.  Compression spot films and ultrasound suggested for
further evaluation.
2.     BI-RADS Category O-Needs Additional Imaging Evaluation.

Thank you for the opportunity to contribute to the care of your patient.

A negative mammogram report does not preclude biopsy or other evaluation of
a clinically palpable or otherwise suspicious mass or lesion.  Breast cancer
may not be detected by mammography in up to 10% of cases.

## 2013-11-19 IMAGING — MG MAM DGTL [HOSPITAL] ADD VIEWS LT DIAG
1 series · 2 of 2 positions shown · non-contrast
Comparison: none

REASON FOR EXAM: av lt parenchymal denstiy
COMMENTS:

[L CC · left · 2 of 2 slices shown]
[im 1/2]
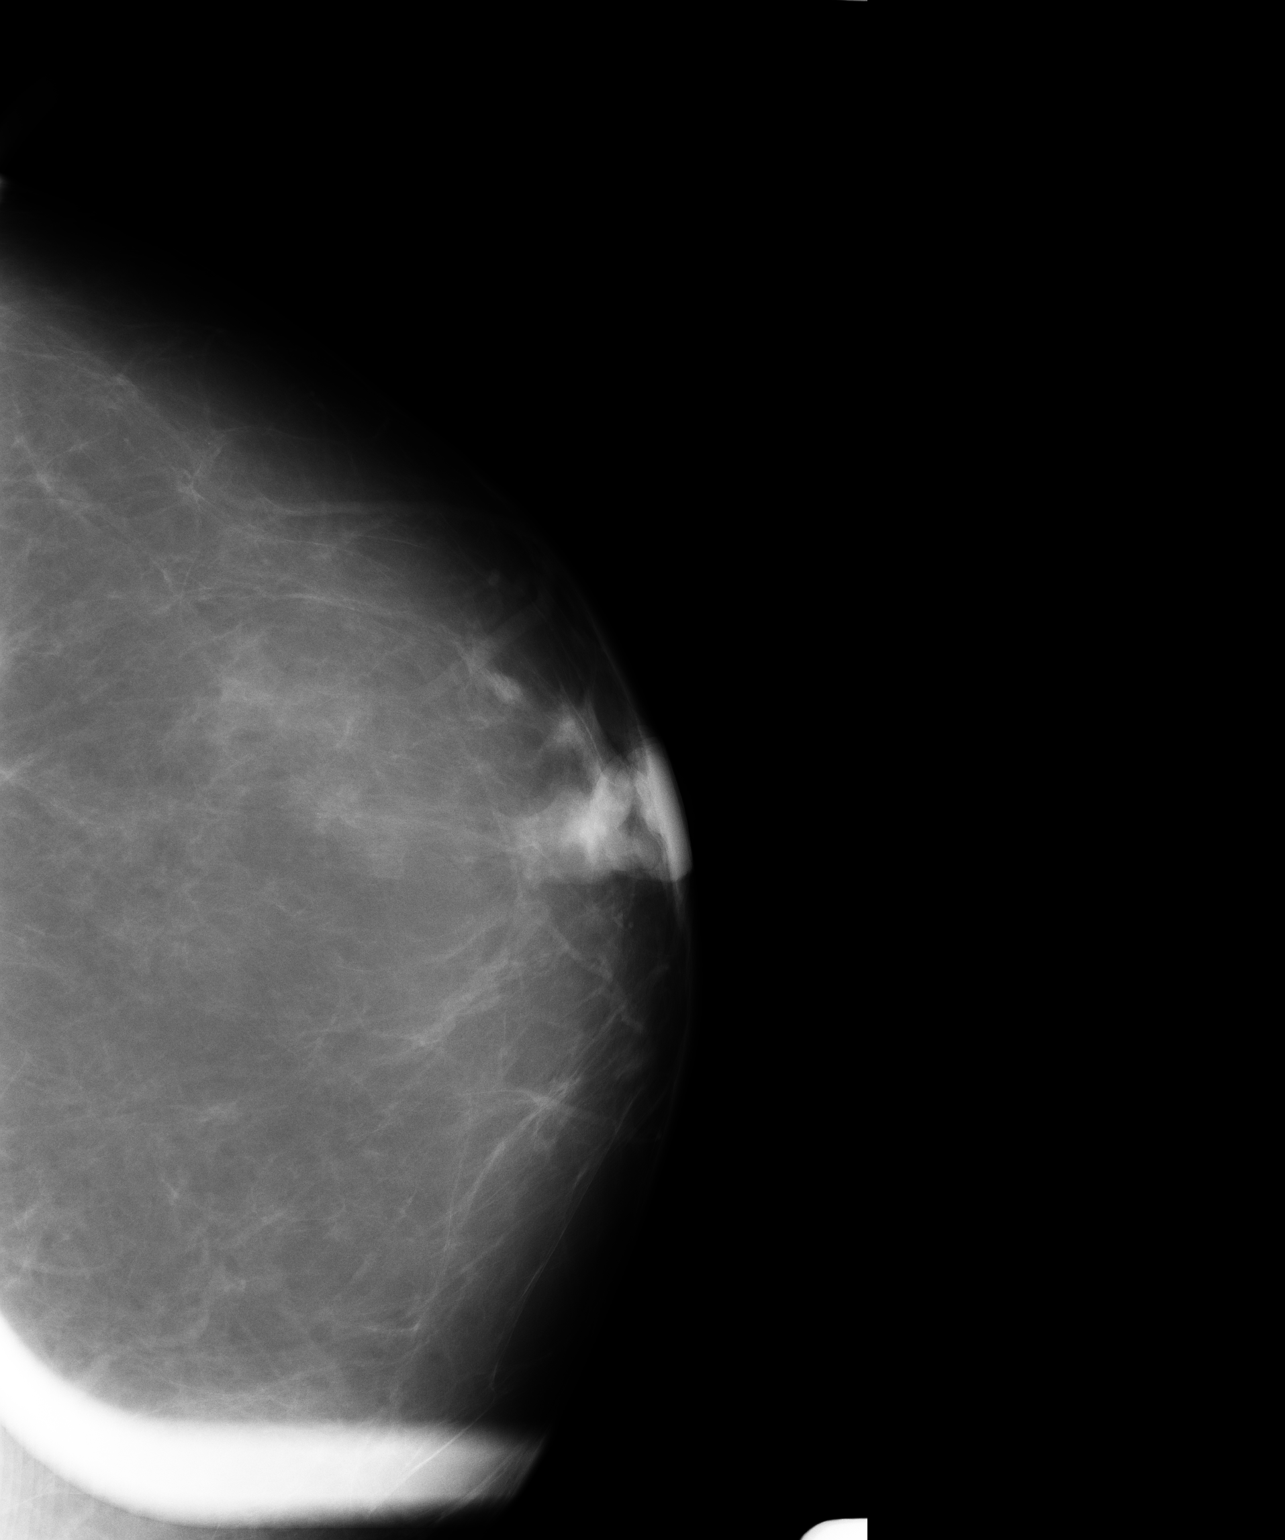
[im 2/2]
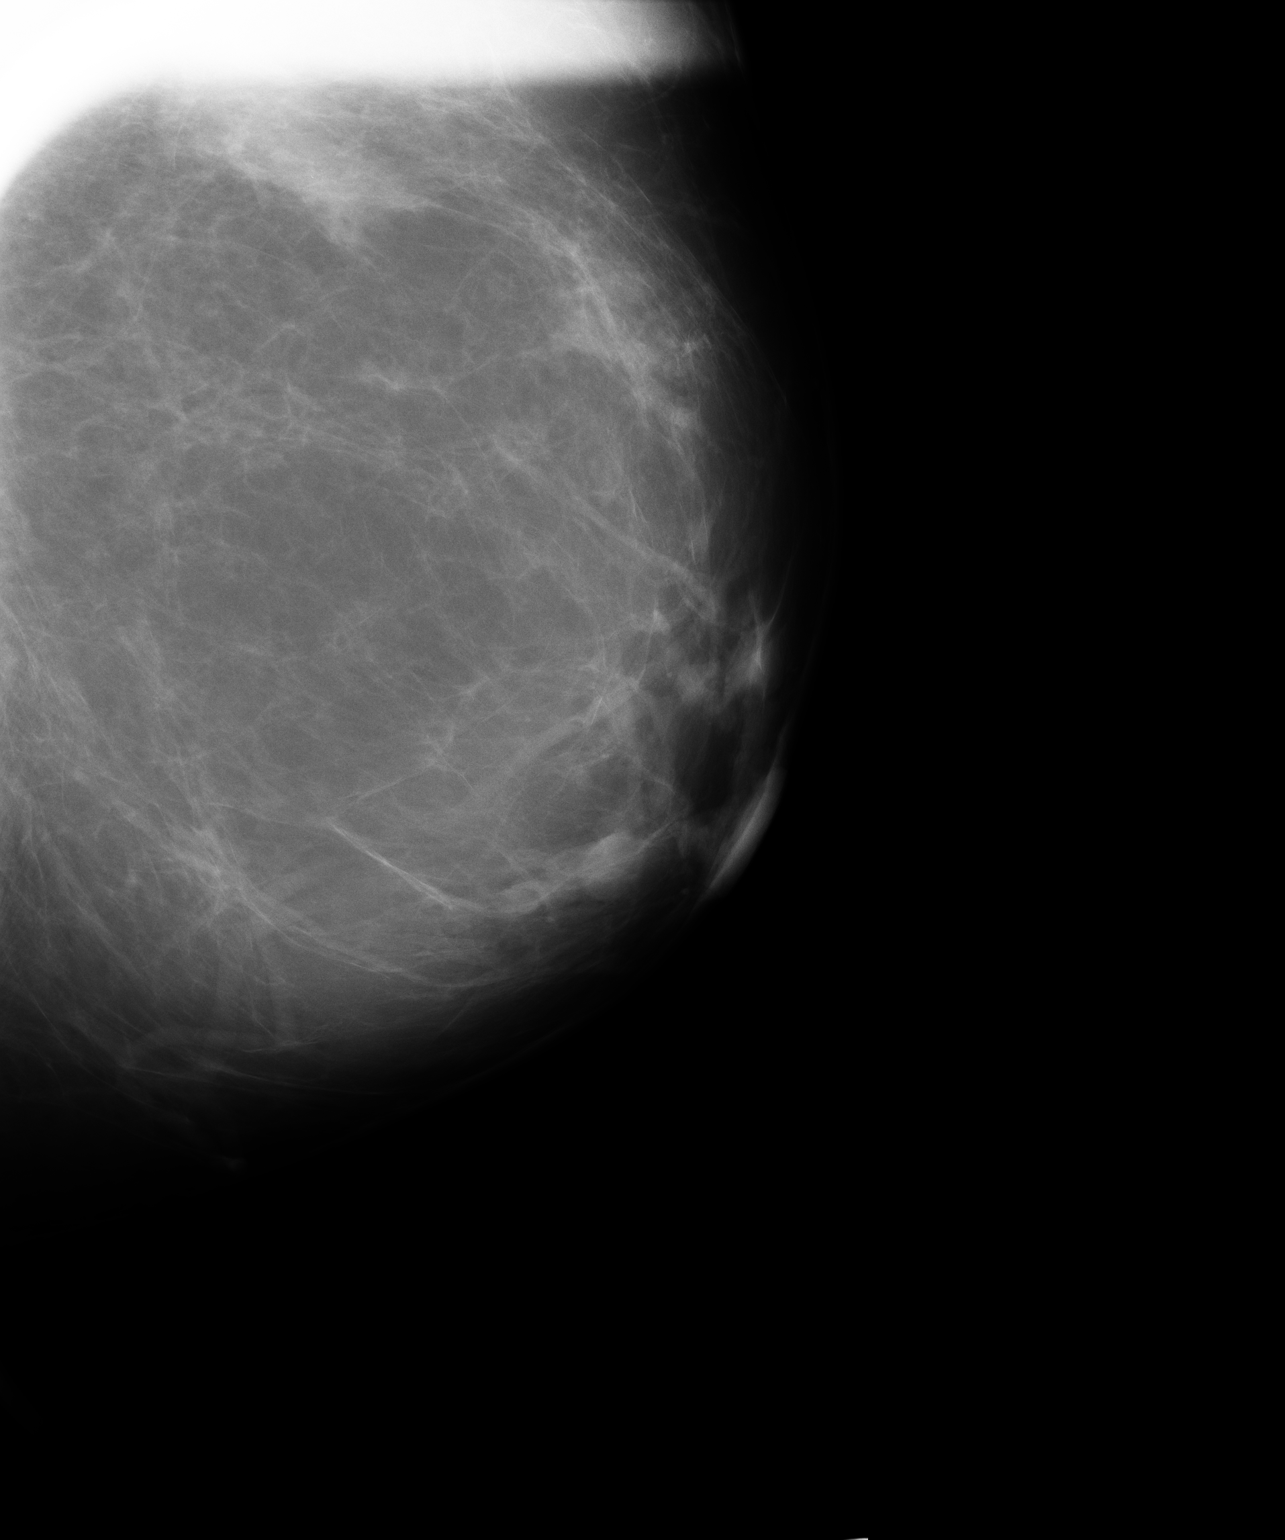

[2 of 2 positions shown; findings below may reference images not displayed]

PROCEDURE:     MAM - MAM DGTL [HOSPITAL] ADD VIEWS LT DIAG  - January 20, 2012  [DATE]

RESULT:     Additional views of the left breast reveal nodularity in the
retroareolar portion of the left breast most likely prominent ducts;
however, ultrasound is suggested. Ultrasound and final BI-RADS disposition
will be performed by Dr. Zensuke.
IMPRESSION: 1. Persistent nodularity in the retroareolar portion of the left breast for
which ultrasound is suggested for further evaluation.

BI-RADS: Category 0 - Needs Addtional Imaging Evaluation

A NEGATIVE MAMMOGRAM REPORT DOES NOT PRECLUDE BIOPSY OR OTHER EVALUATION OF
A CLINICALLY PALPABLE OR OTHERWISE SUSPICIOUS MASS OR LESION. BREAST CA[HOSPITAL]ER
MAY NOT BE DETECTED BY MAMMOGRAPHY IN UP TO 10% OF CASES.

## 2014-02-14 ENCOUNTER — Ambulatory Visit: Payer: Self-pay | Admitting: Family Medicine

## 2014-05-08 ENCOUNTER — Ambulatory Visit: Payer: Self-pay | Admitting: Family Medicine

## 2014-05-24 ENCOUNTER — Ambulatory Visit (INDEPENDENT_AMBULATORY_CARE_PROVIDER_SITE_OTHER): Payer: Medicare Other | Admitting: Ophthalmology

## 2014-05-24 DIAGNOSIS — H338 Other retinal detachments: Secondary | ICD-10-CM

## 2014-05-24 DIAGNOSIS — H3570 Unspecified separation of retinal layers: Secondary | ICD-10-CM

## 2014-05-24 DIAGNOSIS — H43813 Vitreous degeneration, bilateral: Secondary | ICD-10-CM

## 2014-05-24 DIAGNOSIS — H2512 Age-related nuclear cataract, left eye: Secondary | ICD-10-CM

## 2014-05-24 DIAGNOSIS — D3131 Benign neoplasm of right choroid: Secondary | ICD-10-CM

## 2014-06-06 ENCOUNTER — Ambulatory Visit: Payer: Self-pay | Admitting: Family Medicine

## 2014-07-07 ENCOUNTER — Ambulatory Visit: Payer: Self-pay | Admitting: Family Medicine

## 2014-07-24 ENCOUNTER — Encounter (INDEPENDENT_AMBULATORY_CARE_PROVIDER_SITE_OTHER): Payer: Medicare Other | Admitting: Ophthalmology

## 2014-07-24 DIAGNOSIS — H43813 Vitreous degeneration, bilateral: Secondary | ICD-10-CM

## 2014-07-24 DIAGNOSIS — D3131 Benign neoplasm of right choroid: Secondary | ICD-10-CM

## 2014-07-24 DIAGNOSIS — H338 Other retinal detachments: Secondary | ICD-10-CM

## 2014-11-20 IMAGING — MG MM CAD DIAGNOSTIC MAMMO
1 series · 7 of 7 positions shown · non-contrast
Comparison: none

REASON FOR EXAM: LT BR NODULARITY FU AND YRLY
COMMENTS:

[R CC · right · 7 of 7 slices shown]
[im 1/7]
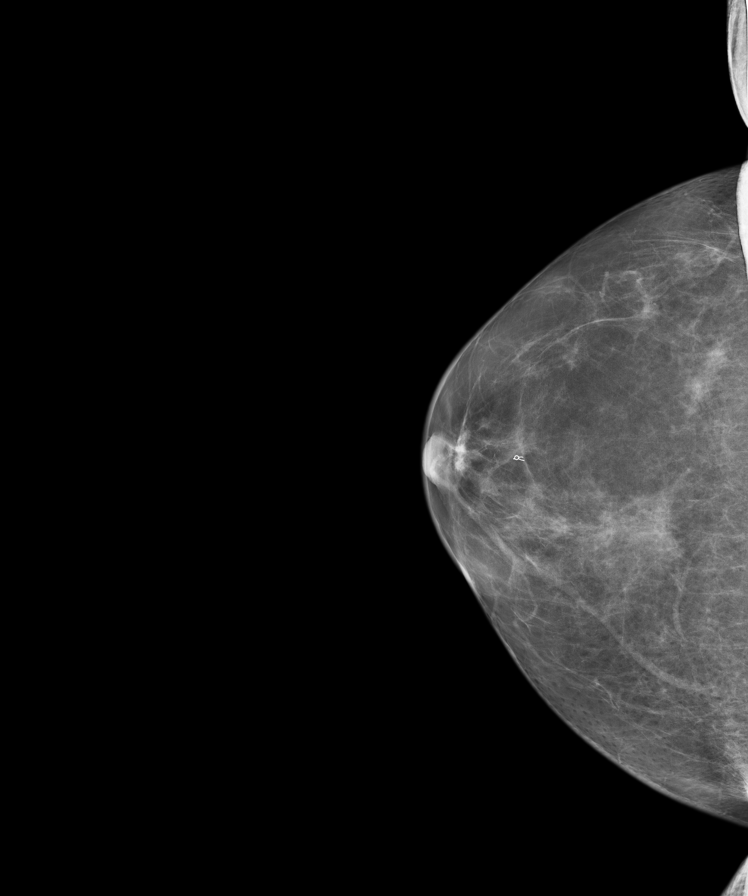
[im 2/7]
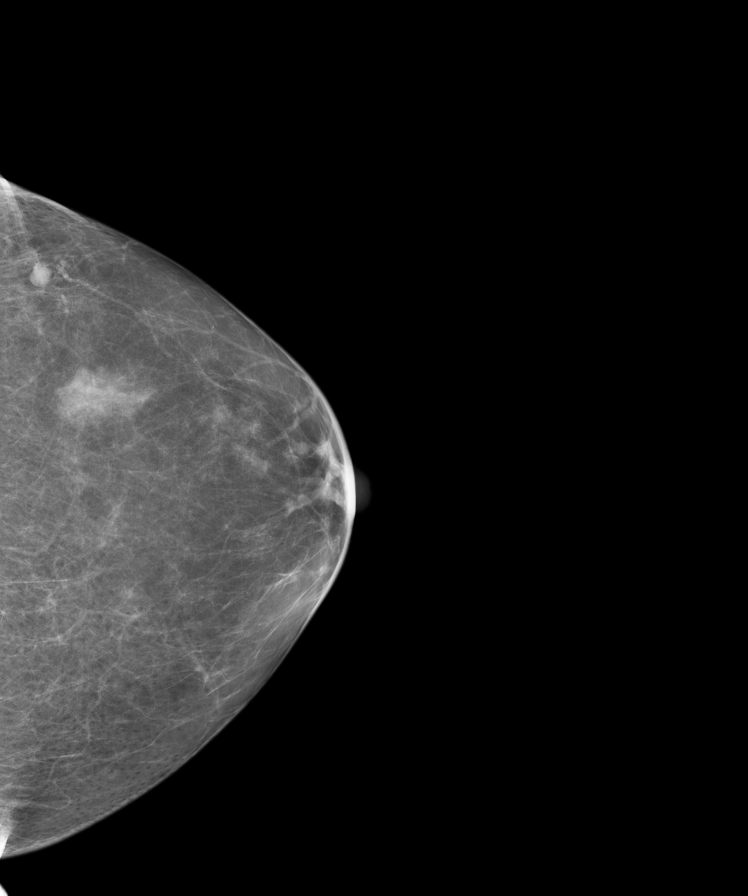
[im 3/7]
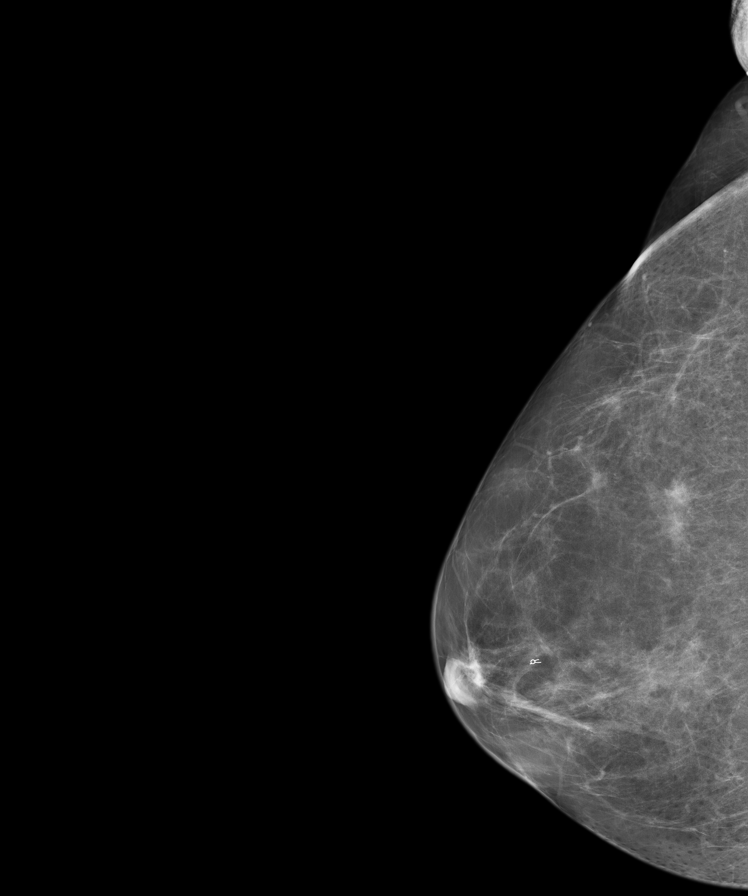
[im 4/7]
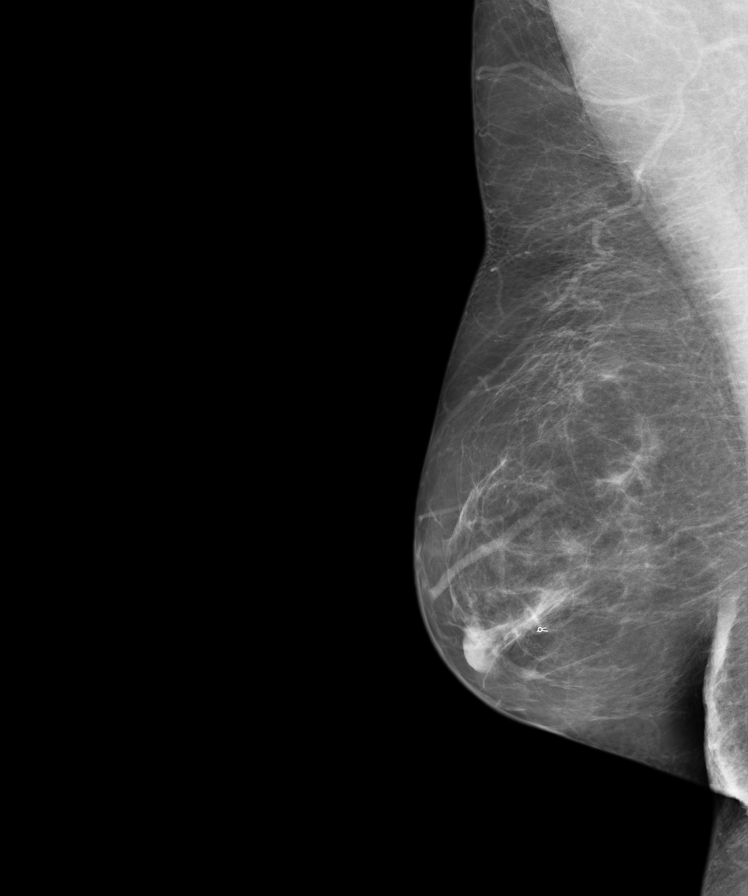
[im 5/7]
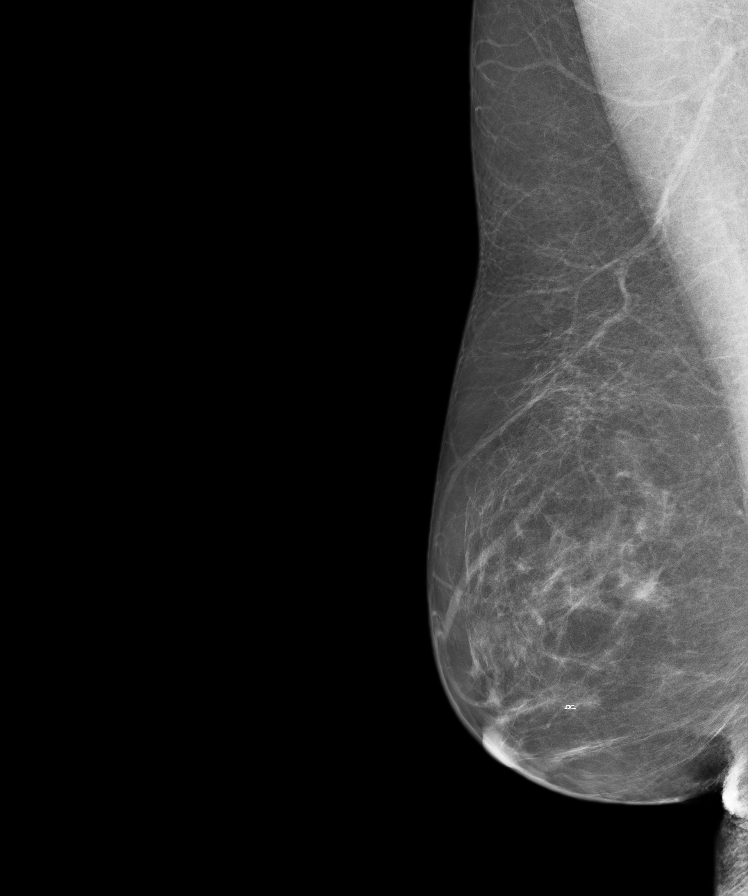
[im 6/7]
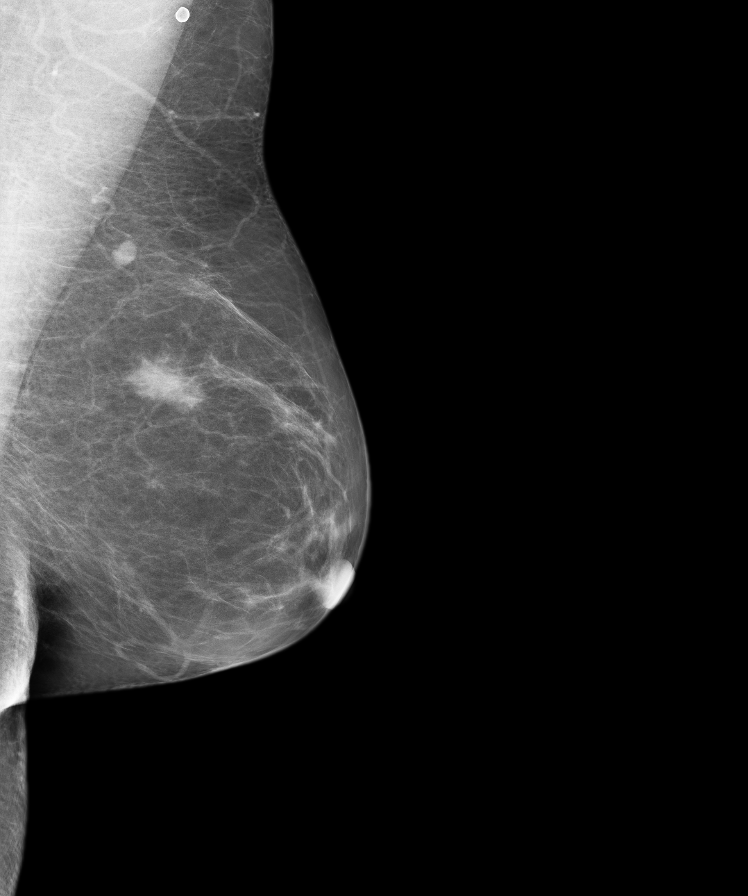
[im 7/7]
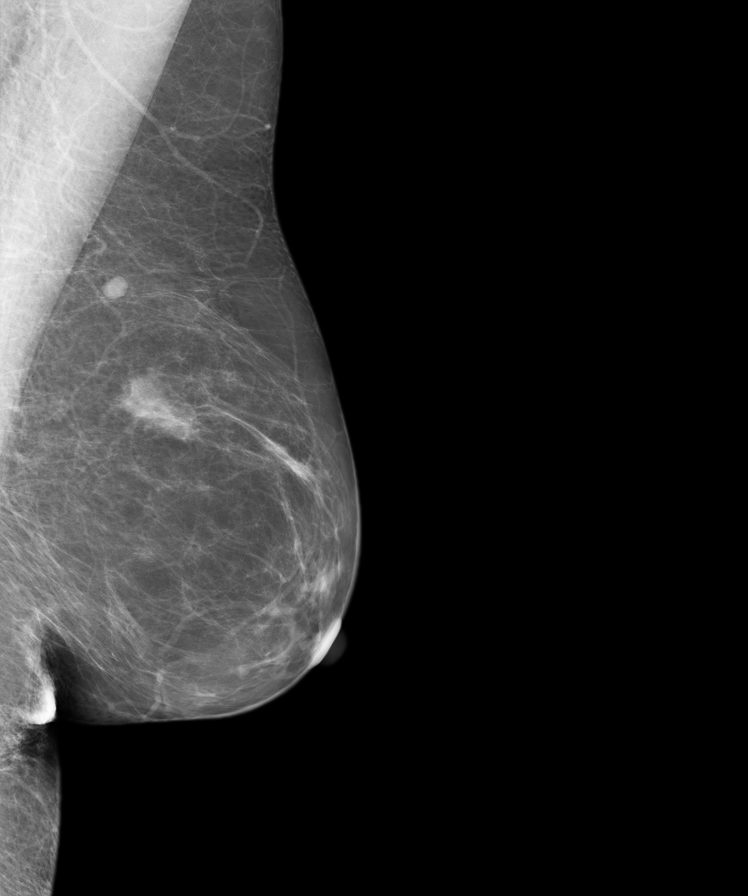

[7 of 7 positions shown; findings below may reference images not displayed]

PROCEDURE:     MAM - MAM DGTL DIAGNOSTIC MAMMO W/CAD  - January 20, 2013  [DATE]

RESULT:     Comparison made to prior exams dating to  06/14/2003. No mass. No
pathologic clustered calcification demonstrated. Parenchymal density in the
upper outer aspect of the left breast is stable. Stereotactic clip in right
breast, the tissue adjacent clip is unremarkable.
IMPRESSION: Benign exam. Yearly followup mammogram suggested.

BI-RADS: Category 2- Benign Finding

A NEGATIVE MAMMOGRAM REPORT DOES NOT PRECLUDE BIOPSY OR OTHER EVALUATION OF
A CLINICALLY PALPABLE OR OTHERWISE SUSPICIOUS MASS OR LESION. BREAST CANCER
MAY NOT BE DETECTED IN UP TO 10% OF CASES.

BREAST COMPOSITION: The breast composition is SCATTERED FIBROGLANDULAR
TISSUE (glandular tissue is 25-50%)

## 2015-01-23 ENCOUNTER — Encounter: Payer: Self-pay | Admitting: *Deleted

## 2015-04-04 ENCOUNTER — Encounter: Payer: Self-pay | Admitting: *Deleted

## 2015-04-05 ENCOUNTER — Ambulatory Visit: Payer: Medicare Other | Admitting: Registered Nurse

## 2015-04-05 ENCOUNTER — Encounter
Admission: RE | Disposition: A | Discharge status: Home / Self Care | Payer: Self-pay | Source: Ambulatory Visit | Attending: Unknown Physician Specialty

## 2015-04-05 ENCOUNTER — Ambulatory Visit
Admission: RE | Admit: 2015-04-05 | Discharge: 2015-04-05 | Disposition: A | Discharge status: Home / Self Care | Payer: Medicare Other | Source: Ambulatory Visit | Attending: Unknown Physician Specialty | Admitting: Unknown Physician Specialty

## 2015-04-05 DIAGNOSIS — Z1211 Encounter for screening for malignant neoplasm of colon: Secondary | ICD-10-CM | POA: Diagnosis present

## 2015-04-05 DIAGNOSIS — F329 Major depressive disorder, single episode, unspecified: Secondary | ICD-10-CM | POA: Insufficient documentation

## 2015-04-05 DIAGNOSIS — E119 Type 2 diabetes mellitus without complications: Secondary | ICD-10-CM | POA: Diagnosis not present

## 2015-04-05 DIAGNOSIS — Z79899 Other long term (current) drug therapy: Secondary | ICD-10-CM | POA: Diagnosis not present

## 2015-04-05 DIAGNOSIS — M810 Age-related osteoporosis without current pathological fracture: Secondary | ICD-10-CM | POA: Insufficient documentation

## 2015-04-05 DIAGNOSIS — K640 First degree hemorrhoids: Secondary | ICD-10-CM | POA: Diagnosis not present

## 2015-04-05 DIAGNOSIS — E785 Hyperlipidemia, unspecified: Secondary | ICD-10-CM | POA: Insufficient documentation

## 2015-04-05 HISTORY — DX: Age-related osteoporosis without current pathological fracture: M81.0

## 2015-04-05 HISTORY — PX: COLONOSCOPY WITH PROPOFOL: SHX5780

## 2015-04-05 HISTORY — DX: Major depressive disorder, single episode, unspecified: F32.9

## 2015-04-05 HISTORY — DX: Hyperlipidemia, unspecified: E78.5

## 2015-04-05 HISTORY — DX: Type 2 diabetes mellitus without complications: E11.9

## 2015-04-05 HISTORY — DX: Palmar fascial fibromatosis (dupuytren): M72.0

## 2015-04-05 SURGERY — COLONOSCOPY WITH PROPOFOL
Anesthesia: General

## 2015-04-05 MED ORDER — SODIUM CHLORIDE 0.9 % IV SOLN
INTRAVENOUS | Status: DC
  Administered 2015-04-05: 1000 mL via INTRAVENOUS

## 2015-04-05 MED ORDER — PROPOFOL 10 MG/ML IV BOLUS
INTRAVENOUS | Status: DC | PRN
  Administered 2015-04-05: 50 mg via INTRAVENOUS

## 2015-04-05 MED ORDER — EPHEDRINE SULFATE 50 MG/ML IJ SOLN
INTRAMUSCULAR | Status: DC | PRN
  Administered 2015-04-05: 10 mg via INTRAVENOUS

## 2015-04-05 MED ORDER — MIDAZOLAM HCL 2 MG/2ML IJ SOLN
INTRAMUSCULAR | Status: DC | PRN
  Administered 2015-04-05: 1 mg via INTRAVENOUS

## 2015-04-05 MED ORDER — PROPOFOL 500 MG/50ML IV EMUL
INTRAVENOUS | Status: DC | PRN
  Administered 2015-04-05: 140 ug/kg/min via INTRAVENOUS

## 2015-04-05 MED ORDER — LIDOCAINE HCL (CARDIAC) 20 MG/ML IV SOLN
INTRAVENOUS | Status: DC | PRN
  Administered 2015-04-05: 40 mg via INTRAVENOUS

## 2015-04-05 MED ORDER — FENTANYL CITRATE (PF) 100 MCG/2ML IJ SOLN
INTRAMUSCULAR | Status: DC | PRN
  Administered 2015-04-05: 50 ug via INTRAVENOUS

## 2015-04-05 MED ORDER — SODIUM CHLORIDE 0.9 % IV SOLN: INTRAVENOUS | Status: DC

## 2015-04-05 NOTE — Op Note (Signed)
[facility] Patient Name: Terry Watson Procedure Date: 04/05/2015 10:00 AM MRN: [id number] Account #: [id number] Date of Birth: 07/17/1945 Admit Type: Outpatient Age: 69 Room: [facility] ENDO ROOM 1 Gender: Female Note Status: Finalized Procedure:         Colonoscopy Indications:       Screening for colorectal malignant neoplasm Providers:         Manya Silvas, MD Referring MD:      Irven Easterly. Kary Kos, MD (Referring MD) Medicines:         Propofol per Anesthesia Complications:     No immediate complications. Procedure:         Pre-Anesthesia Assessment:                    - After reviewing the risks and benefits, the patient was                     deemed in satisfactory condition to undergo the procedure.                    After obtaining informed consent, the colonoscope was                     passed under direct vision. Throughout the procedure, the                     patient's blood pressure, pulse, and oxygen saturations                     were monitored continuously. The Olympus PCF-H180AL                     colonoscope ( S#: [id number] ) was introduced through the                     anus and advanced to the the cecum, identified by                     appendiceal orifice and ileocecal valve. The colonoscopy                     was performed without difficulty. The patient tolerated                     the procedure well. The quality of the bowel preparation                     was good. Findings:      A submucosal non-obstructing medium-sized mass like prominence very       soft, not a true polyp, possible cyst or fullness was found in the       proximal sigmoid colon. The mass was non-circumferential. The mass       measured two cm in length. No bleeding was present. Biopsies were taken       with a cold forceps for histology. Looks very benign.      Internal hemorrhoids were found during endoscopy. The hemorrhoids were       small and  Grade I (internal hemorrhoids that do not prolapse). Impression:        - Likely benign tumor in the proximal sigmoid colon.                     Biopsied. Biopsied.                    -  Internal hemorrhoids. Recommendation:    - Await pathology results. Manya Silvas, MD 04/05/2015 10:32:42 AM This report has been signed electronically. Number of Addenda: 0 Note Initiated On: 04/05/2015 10:00 AM Scope Withdrawal Time: 0 hours 10 minutes 14 seconds  Total Procedure Duration: 0 hours 15 minutes 49 seconds       [facility]

## 2015-04-05 NOTE — Transfer of Care (Signed)
Immediate Anesthesia Transfer of Care Note  Patient: Terry Watson  Procedure(s) Performed: Procedure(s): COLONOSCOPY WITH PROPOFOL (N/A)  Patient Location: PACU and Endoscopy Unit  Anesthesia Type:General  Level of Consciousness: sedated  Airway & Oxygen Therapy: Patient Spontanous Breathing and Patient connected to nasal cannula oxygen  Post-op Assessment: Report given to RN and Post -op Vital signs reviewed and stable  Post vital signs: Reviewed and stable  Last Vitals:  Filed Vitals:   04/05/15 1035  BP: 105/42  Pulse:   Temp: 36.6 C  Resp: 12    Complications: No apparent anesthesia complications

## 2015-04-05 NOTE — Anesthesia Preprocedure Evaluation (Signed)
Anesthesia Evaluation  Patient identified by MRN, date of birth, ID band Patient awake    Reviewed: Allergy & Precautions, H&P , NPO status , Patient's Chart, lab work & pertinent test results, reviewed documented beta blocker date and time   Airway Mallampati: II  TM Distance: >3 FB Neck ROM: full    Dental no notable dental hx.    Pulmonary neg pulmonary ROS,    Pulmonary exam normal breath sounds clear to auscultation       Cardiovascular Exercise Tolerance: Good negative cardio ROS   Rhythm:regular Rate:Normal     Neuro/Psych negative neurological ROS  negative psych ROS   GI/Hepatic negative GI ROS, Neg liver ROS,   Endo/Other  negative endocrine ROSdiabetes  Renal/GU negative Renal ROS  negative genitourinary   Musculoskeletal   Abdominal   Peds  Hematology negative hematology ROS (+)   Anesthesia Other Findings   Reproductive/Obstetrics negative OB ROS                             Anesthesia Physical Anesthesia Plan  ASA: II  Anesthesia Plan: General   Post-op Pain Management:    Induction:   Airway Management Planned:   Additional Equipment:   Intra-op Plan:   Post-operative Plan:   Informed Consent: I have reviewed the patients History and Physical, chart, labs and discussed the procedure including the risks, benefits and alternatives for the proposed anesthesia with the patient or authorized representative who has indicated his/her understanding and acceptance.   Dental Advisory Given  Plan Discussed with: CRNA  Anesthesia Plan Comments:         Anesthesia Quick Evaluation

## 2015-04-05 NOTE — Anesthesia Procedure Notes (Signed)
Date/Time: 04/05/2015 10:11 AM Performed by: Doreen Salvage Pre-anesthesia Checklist: Patient identified, Emergency Drugs available, Suction available and Patient being monitored Patient Re-evaluated:Patient Re-evaluated prior to inductionOxygen Delivery Method: Nasal cannula Intubation Type: IV induction Dental Injury: Teeth and Oropharynx as per pre-operative assessment  Comments: Nasal cannula with etCO2 monitoring
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
## 2015-04-06 LAB — SURGICAL PATHOLOGY

## 2015-04-09 NOTE — Anesthesia Postprocedure Evaluation (Signed)
  Anesthesia Post-op Note  Patient: Terry Watson  Procedure(s) Performed: Procedure(s): COLONOSCOPY WITH PROPOFOL (N/A)  Anesthesia type:General  Patient location: PACU  Post pain: Pain level controlled  Post assessment: Post-op Vital signs reviewed, Patient's Cardiovascular Status Stable, Respiratory Function Stable, Patent Airway and No signs of Nausea or vomiting  Post vital signs: Reviewed and stable  Last Vitals:  Filed Vitals:   04/05/15 1100  BP: 117/51  Pulse: 69  Temp:   Resp: 11    Level of consciousness: awake, alert  and patient cooperative  Complications: No apparent anesthesia complications

## 2015-04-30 ENCOUNTER — Other Ambulatory Visit: Payer: Self-pay | Admitting: Family Medicine

## 2015-04-30 DIAGNOSIS — Z1231 Encounter for screening mammogram for malignant neoplasm of breast: Secondary | ICD-10-CM

## 2015-04-30 DIAGNOSIS — Z0000 Encounter for general adult medical examination without abnormal findings: Secondary | ICD-10-CM

## 2015-05-11 ENCOUNTER — Ambulatory Visit
Admission: RE | Admit: 2015-05-11 | Discharge: 2015-05-11 | Disposition: A | Discharge status: Home / Self Care | Payer: Medicare Other | Source: Ambulatory Visit | Attending: Family Medicine | Admitting: Family Medicine

## 2015-05-11 ENCOUNTER — Other Ambulatory Visit: Payer: Self-pay | Admitting: Family Medicine

## 2015-05-11 DIAGNOSIS — Z1231 Encounter for screening mammogram for malignant neoplasm of breast: Secondary | ICD-10-CM

## 2015-05-11 DIAGNOSIS — Z0000 Encounter for general adult medical examination without abnormal findings: Secondary | ICD-10-CM

## 2015-08-01 ENCOUNTER — Ambulatory Visit (INDEPENDENT_AMBULATORY_CARE_PROVIDER_SITE_OTHER): Payer: Medicare Other | Admitting: Ophthalmology

## 2015-08-01 DIAGNOSIS — H338 Other retinal detachments: Secondary | ICD-10-CM | POA: Diagnosis not present

## 2015-08-01 DIAGNOSIS — D3131 Benign neoplasm of right choroid: Secondary | ICD-10-CM | POA: Diagnosis not present

## 2015-08-01 DIAGNOSIS — H43813 Vitreous degeneration, bilateral: Secondary | ICD-10-CM

## 2015-12-15 IMAGING — MG MM DIGITAL SCREENING BILAT W/ CAD
1 series · 6 of 6 positions shown · non-contrast
Comparison: Previous exam(s).

CLINICAL DATA: Screening.

EXAM:
DIGITAL SCREENING BILATERAL MAMMOGRAM WITH CAD

[R CC · right · 6 of 6 slices shown]
[im 1/6]
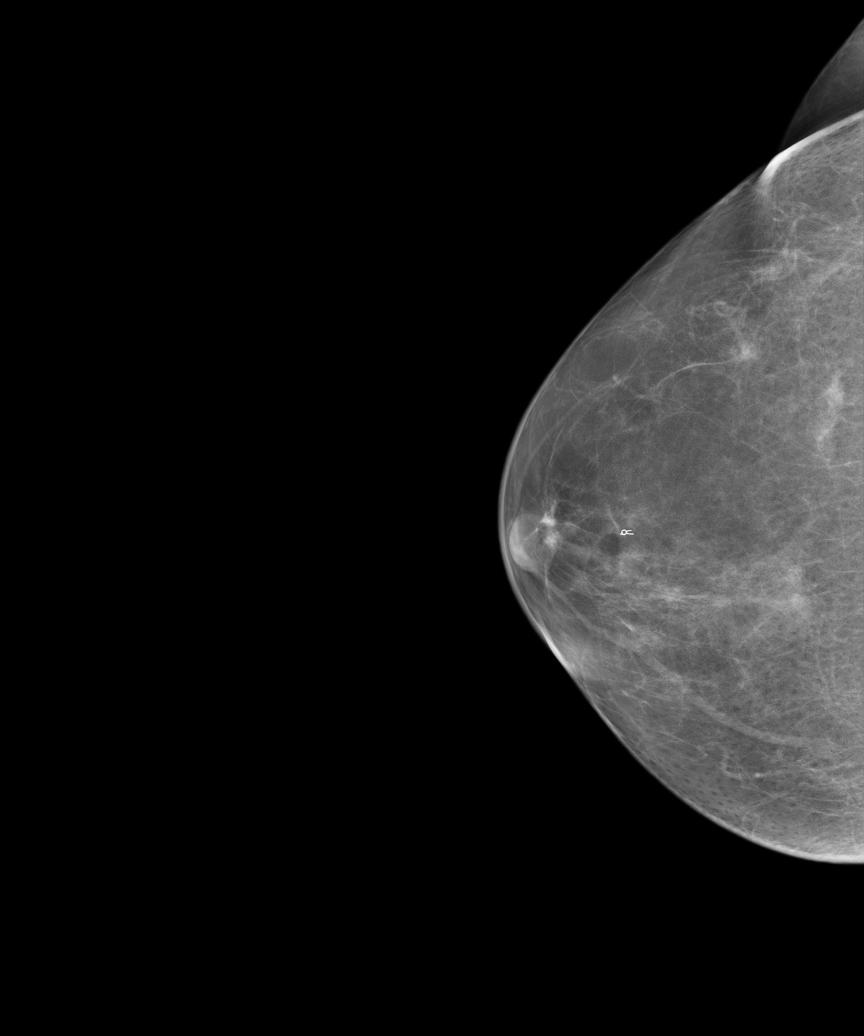
[im 2/6]
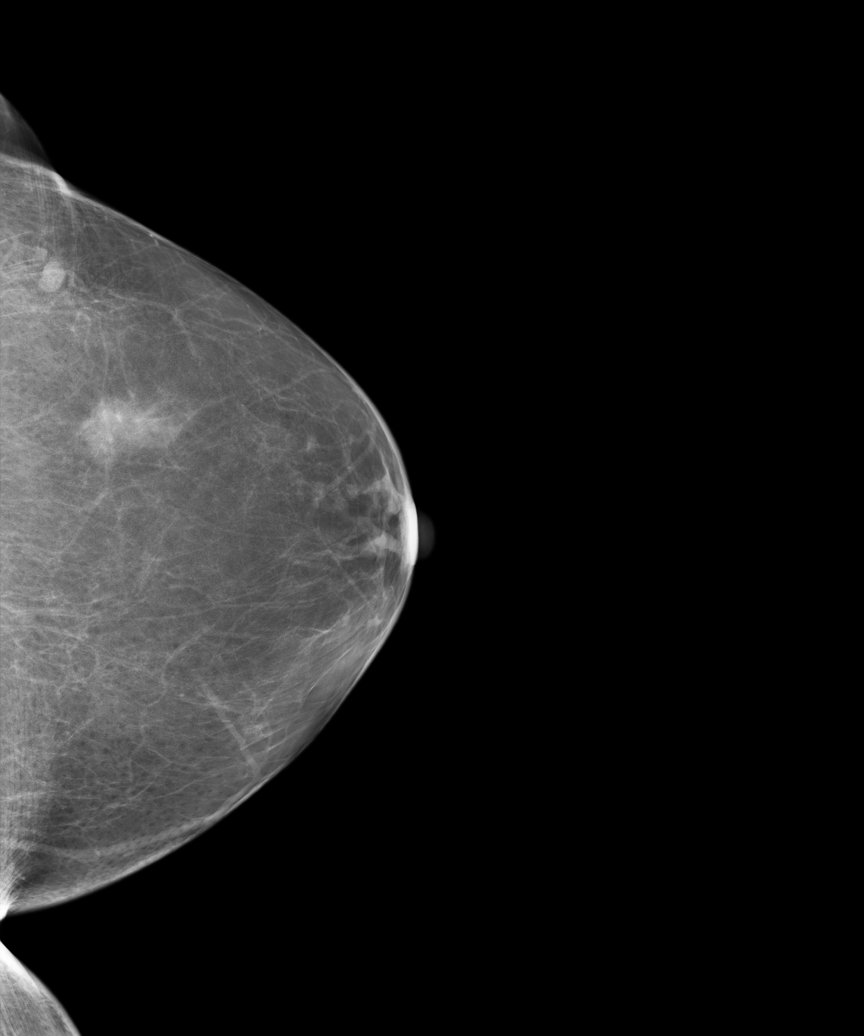
[im 3/6]
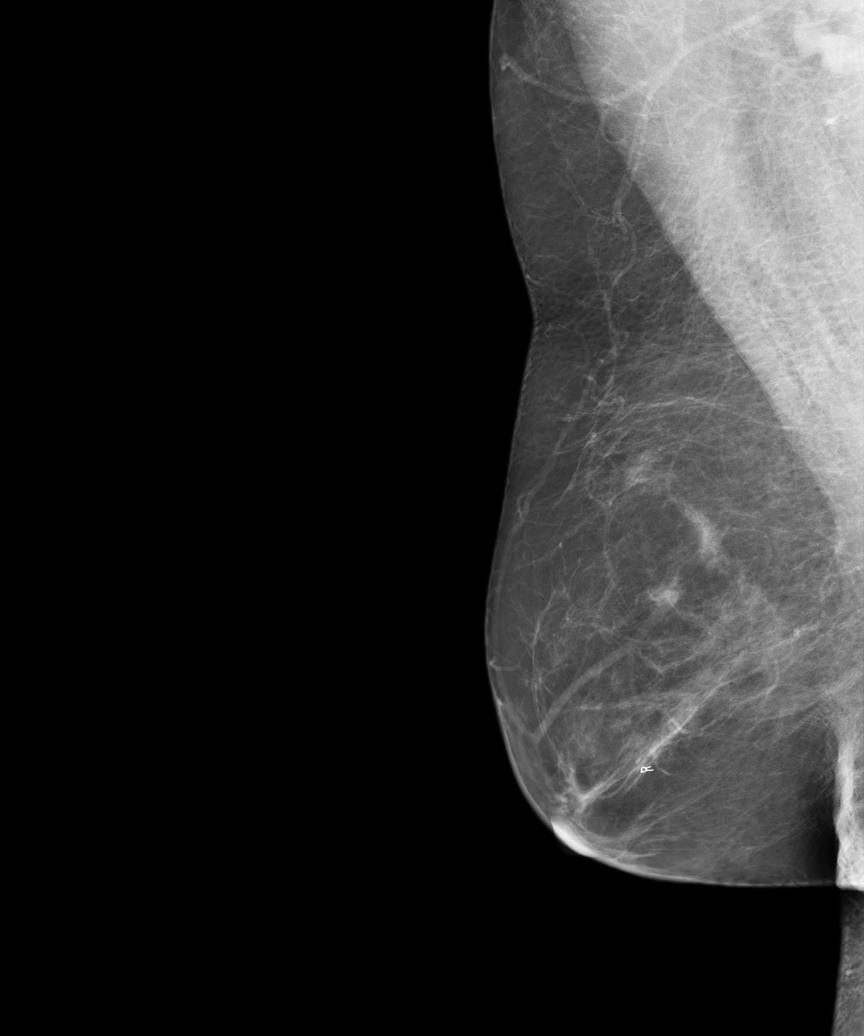
[im 4/6]
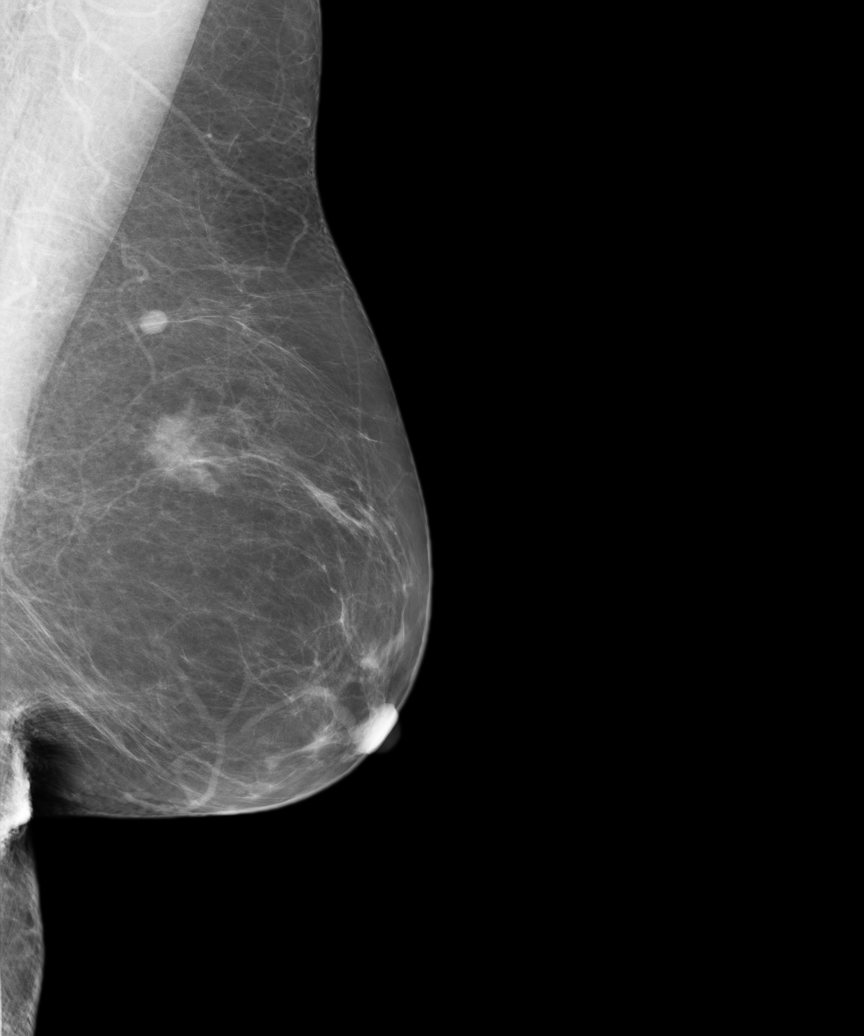
[im 5/6]
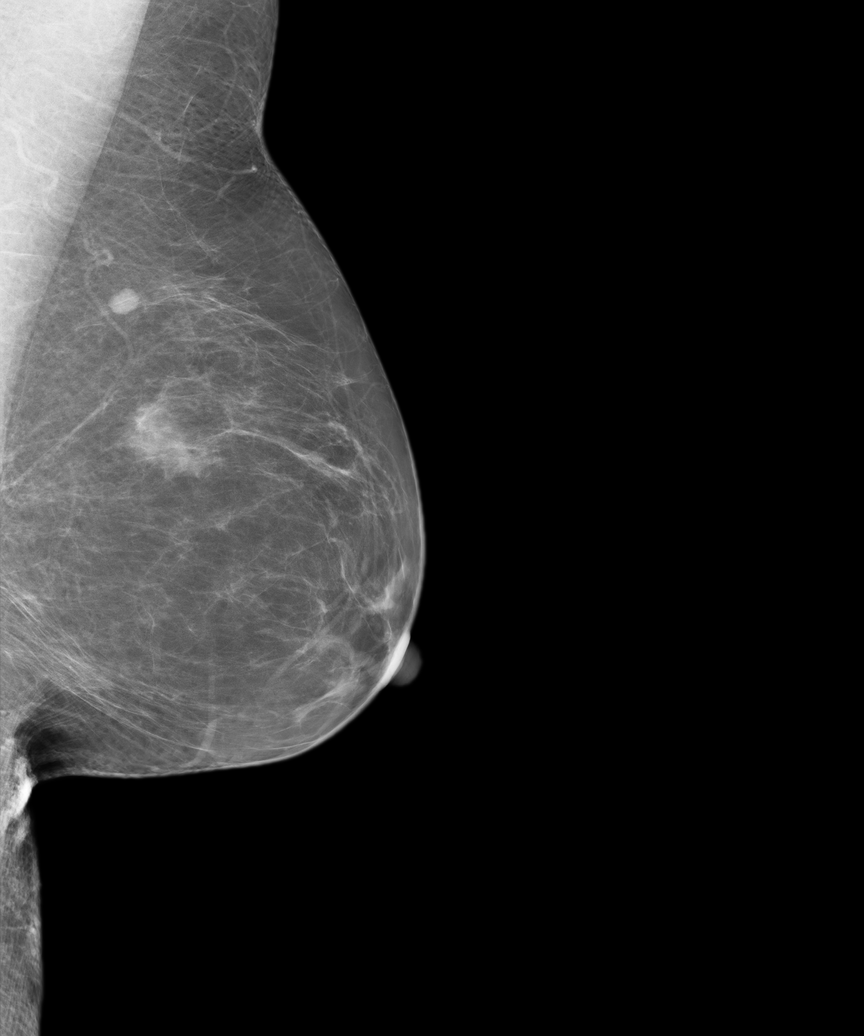
[im 6/6]
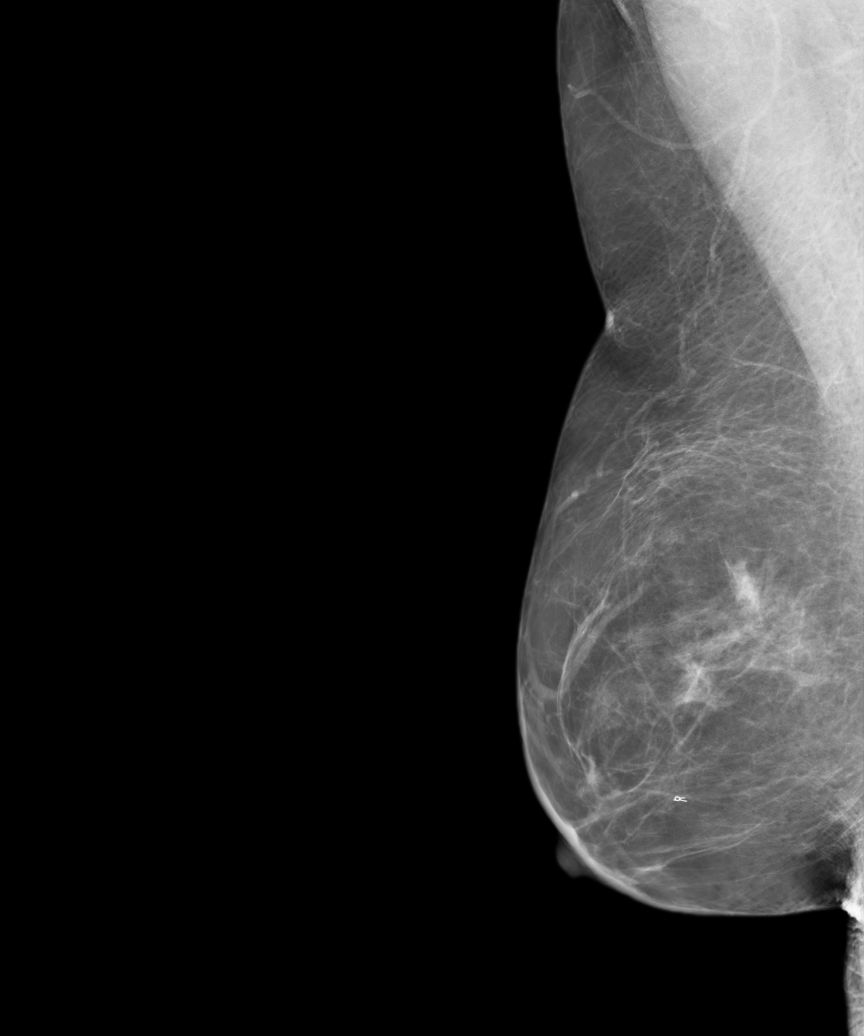

[6 of 6 positions shown; findings below may reference images not displayed]

ACR Breast Density Category b: There are scattered areas of
fibroglandular density.
FINDINGS: There are no findings suspicious for malignancy. Images were
processed with CAD.
IMPRESSION: No mammographic evidence of malignancy. A result letter of this
screening mammogram will be mailed directly to the patient.

RECOMMENDATION:
Screening mammogram in one year. (Code:AS-G-LCT)

BI-RADS CATEGORY  1: Negative.

## 2016-05-05 ENCOUNTER — Other Ambulatory Visit: Payer: Self-pay | Admitting: Family Medicine

## 2016-05-05 DIAGNOSIS — Z1239 Encounter for other screening for malignant neoplasm of breast: Secondary | ICD-10-CM

## 2016-07-04 ENCOUNTER — Ambulatory Visit
Admission: RE | Admit: 2016-07-04 | Discharge: 2016-07-04 | Disposition: A | Discharge status: Home / Self Care | Payer: Medicare Other | Source: Ambulatory Visit | Attending: Family Medicine | Admitting: Family Medicine

## 2016-07-04 DIAGNOSIS — Z1231 Encounter for screening mammogram for malignant neoplasm of breast: Secondary | ICD-10-CM | POA: Diagnosis present

## 2016-07-04 DIAGNOSIS — Z1239 Encounter for other screening for malignant neoplasm of breast: Secondary | ICD-10-CM

## 2016-07-31 ENCOUNTER — Ambulatory Visit (INDEPENDENT_AMBULATORY_CARE_PROVIDER_SITE_OTHER): Payer: Medicare Other | Admitting: Ophthalmology

## 2016-08-04 ENCOUNTER — Ambulatory Visit (INDEPENDENT_AMBULATORY_CARE_PROVIDER_SITE_OTHER): Payer: Medicare Other | Admitting: Ophthalmology

## 2016-08-04 DIAGNOSIS — H338 Other retinal detachments: Secondary | ICD-10-CM

## 2016-08-04 DIAGNOSIS — H43813 Vitreous degeneration, bilateral: Secondary | ICD-10-CM | POA: Diagnosis not present

## 2016-08-04 DIAGNOSIS — H2512 Age-related nuclear cataract, left eye: Secondary | ICD-10-CM

## 2016-08-04 DIAGNOSIS — D3131 Benign neoplasm of right choroid: Secondary | ICD-10-CM | POA: Diagnosis not present

## 2017-03-10 IMAGING — MG MM SCREENING BREAST TOMO BILATERAL
9 of 12 series · 9 of 28 positions shown · non-contrast
Comparison: Previous exam(s).

CLINICAL DATA: Screening.

EXAM:
DIGITAL SCREENING BILATERAL MAMMOGRAM WITH 3D TOMO WITH CAD

[L CC]
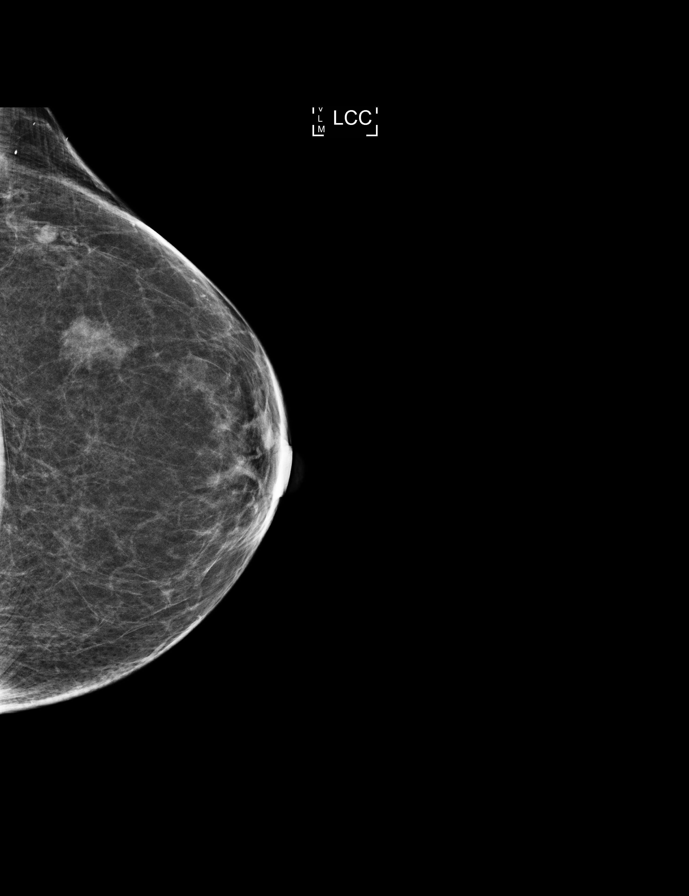

[L MLO synth-2D]
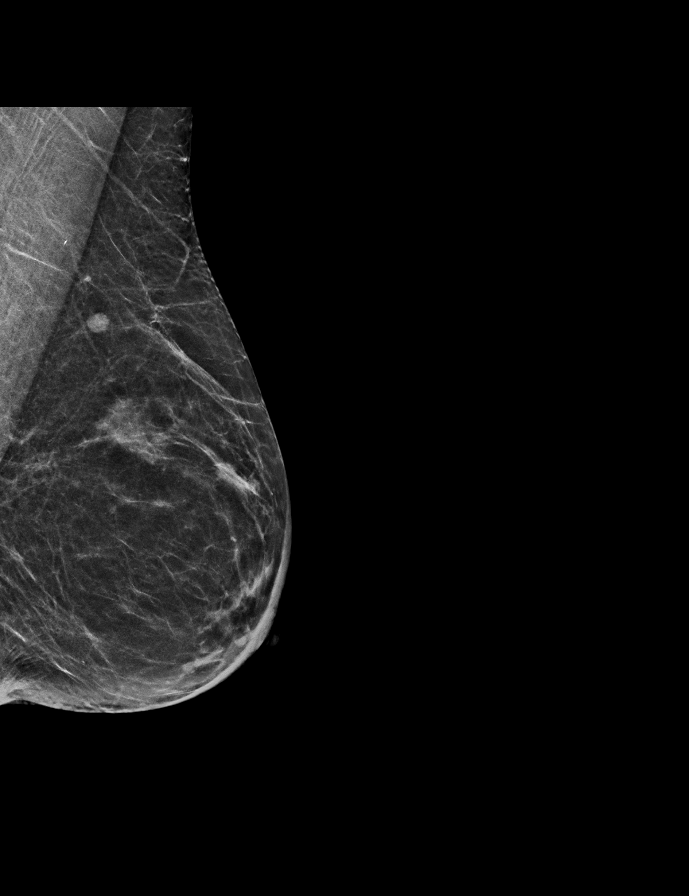

[L MLO]
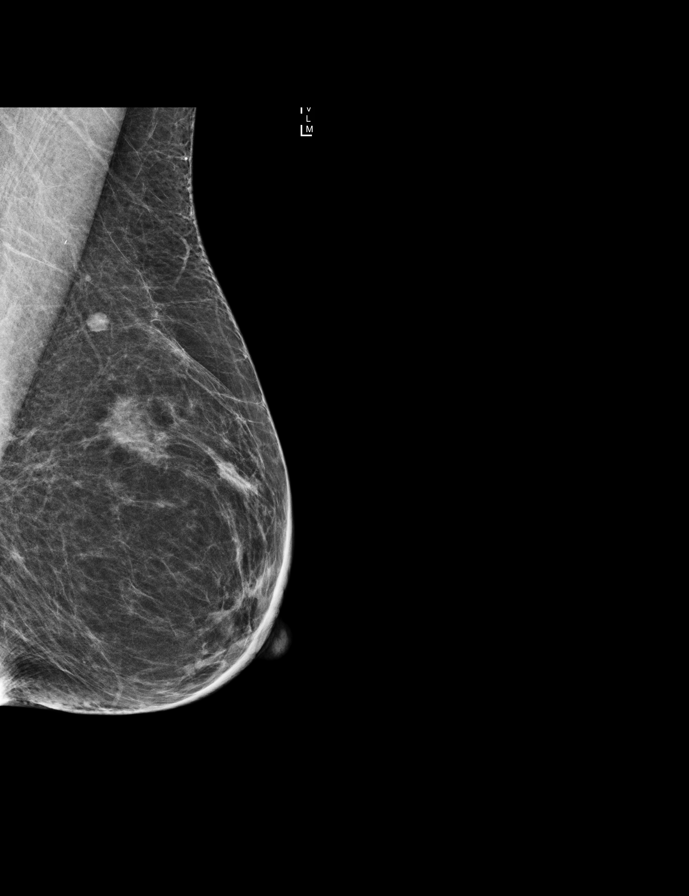

[R CC]
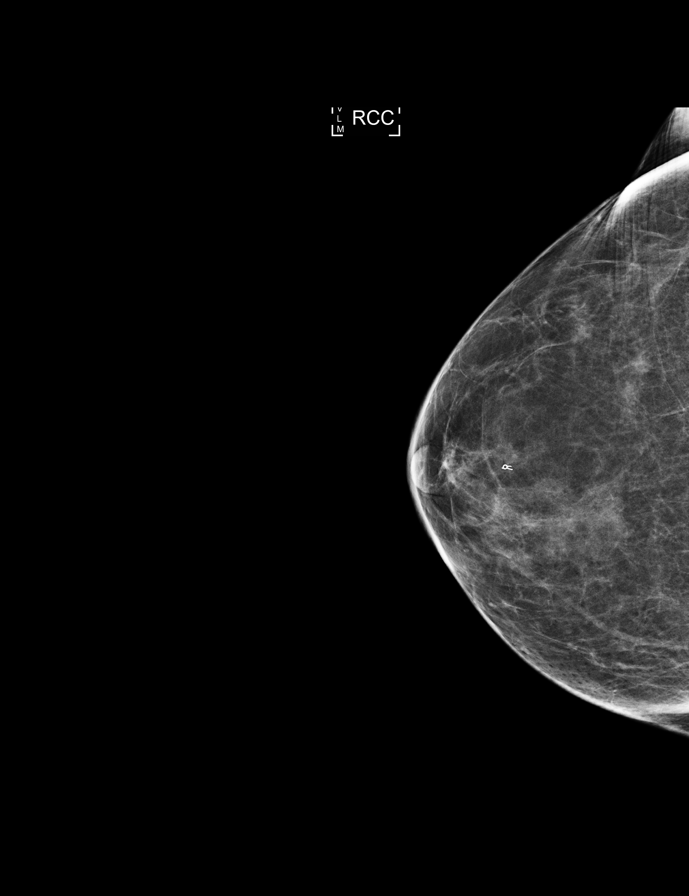

[R MLO synth-2D]
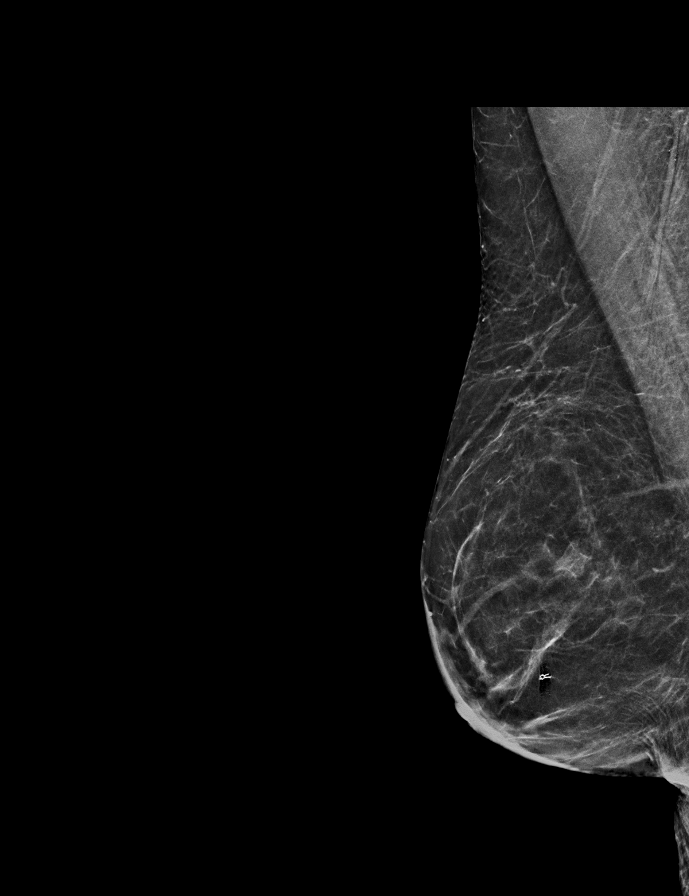

[L CC synth-2D]
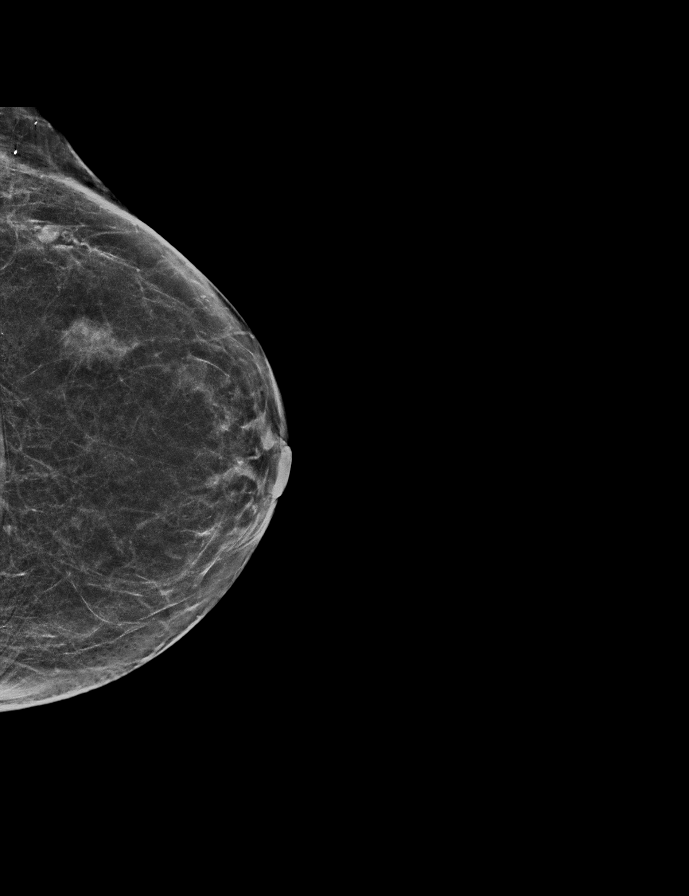

[R MLO]
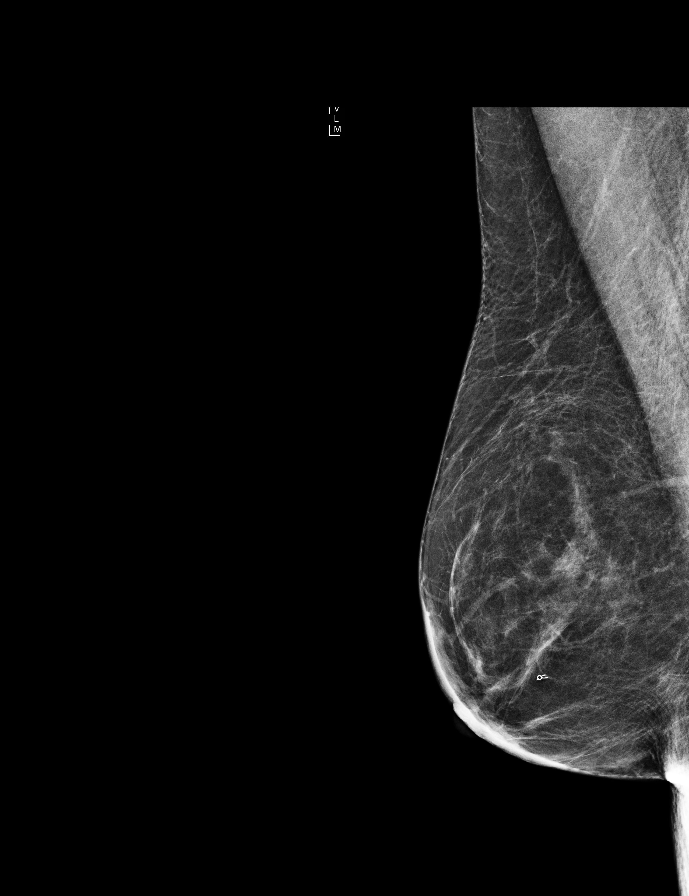

[R CC synth-2D]
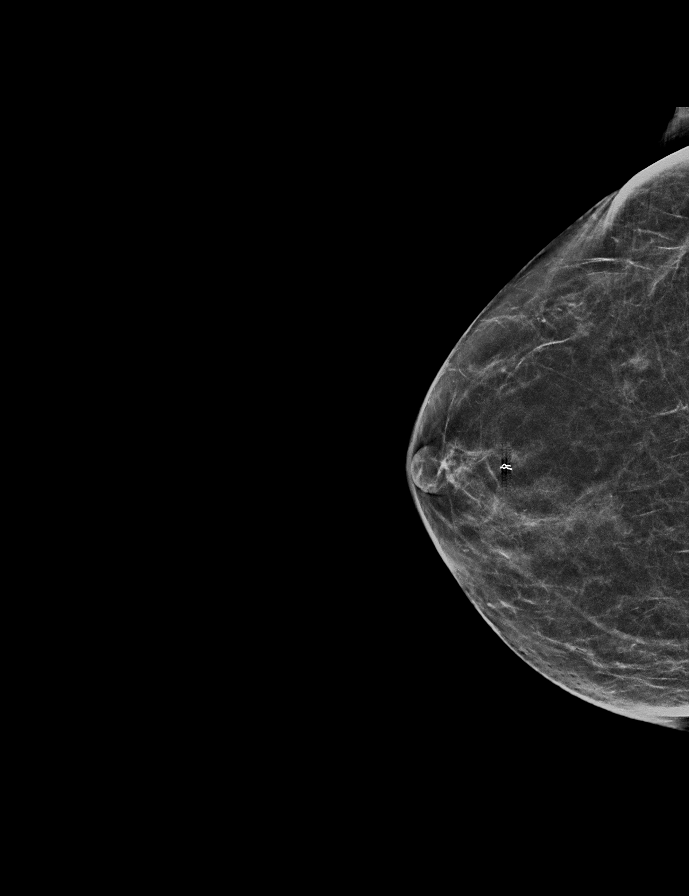

[L CC tomo · tomo slice 27/52.0]
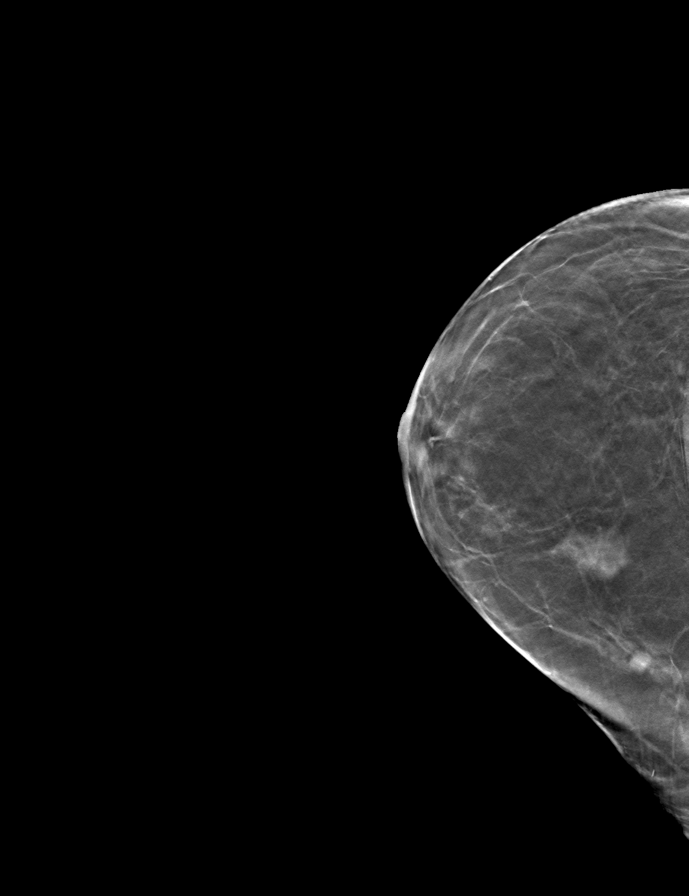

[9 of 28 positions shown; findings below may reference images not displayed]

ACR Breast Density Category b: There are scattered areas of
fibroglandular density.
FINDINGS: There are no findings suspicious for malignancy. Images were
processed with CAD.
IMPRESSION: No mammographic evidence of malignancy. A result letter of this
screening mammogram will be mailed directly to the patient.

RECOMMENDATION:
Screening mammogram in one year. (Code:55-L-23V)

BI-RADS CATEGORY  1: Negative.

## 2017-05-06 ENCOUNTER — Other Ambulatory Visit: Payer: Self-pay | Admitting: Family Medicine

## 2017-05-06 DIAGNOSIS — Z1231 Encounter for screening mammogram for malignant neoplasm of breast: Secondary | ICD-10-CM

## 2017-07-06 ENCOUNTER — Ambulatory Visit
Admission: RE | Admit: 2017-07-06 | Discharge: 2017-07-06 | Disposition: A | Discharge status: Home / Self Care | Payer: Medicare Other | Source: Ambulatory Visit | Attending: Family Medicine | Admitting: Family Medicine

## 2017-07-06 DIAGNOSIS — Z1231 Encounter for screening mammogram for malignant neoplasm of breast: Secondary | ICD-10-CM | POA: Insufficient documentation

## 2017-08-05 ENCOUNTER — Ambulatory Visit (INDEPENDENT_AMBULATORY_CARE_PROVIDER_SITE_OTHER): Payer: Medicare Other | Admitting: Ophthalmology

## 2017-08-05 DIAGNOSIS — H338 Other retinal detachments: Secondary | ICD-10-CM

## 2017-08-05 DIAGNOSIS — H43813 Vitreous degeneration, bilateral: Secondary | ICD-10-CM | POA: Diagnosis not present

## 2017-08-05 DIAGNOSIS — D3131 Benign neoplasm of right choroid: Secondary | ICD-10-CM | POA: Diagnosis not present

## 2017-08-05 DIAGNOSIS — H2512 Age-related nuclear cataract, left eye: Secondary | ICD-10-CM | POA: Diagnosis not present

## 2018-05-03 LAB — BASIC METABOLIC PANEL
BUN: 16 (ref 4–21)
Creatinine: 0.6 (ref 0.5–1.1)
Glucose: 119
Potassium: 4.2 (ref 3.4–5.3)
Sodium: 142 (ref 137–147)

## 2018-05-03 LAB — CBC AND DIFFERENTIAL
HCT: 43 (ref 36–46)
Hemoglobin: 13.7 (ref 12.0–16.0)
Platelets: 146 — AB (ref 150–399)
WBC: 4.5

## 2018-05-03 LAB — LIPID PANEL
Cholesterol: 153 (ref 0–200)
HDL: 36 (ref 35–70)
LDL Cholesterol: 101
Triglycerides: 76 (ref 40–160)

## 2018-05-03 LAB — HEPATIC FUNCTION PANEL
ALT: 14 (ref 7–35)
AST: 14 (ref 13–35)
Alkaline Phosphatase: 38 (ref 25–125)

## 2018-05-03 LAB — HEMOGLOBIN A1C: Hemoglobin A1C: 6.4

## 2018-05-04 IMAGING — MG MM DIGITAL SCREENING BILAT W/ CAD
5 series · 5 of 5 positions shown · non-contrast
Comparison: Previous exam(s).

CLINICAL DATA: Screening.

EXAM:
DIGITAL SCREENING BILATERAL MAMMOGRAM WITH CAD

[L MLO]
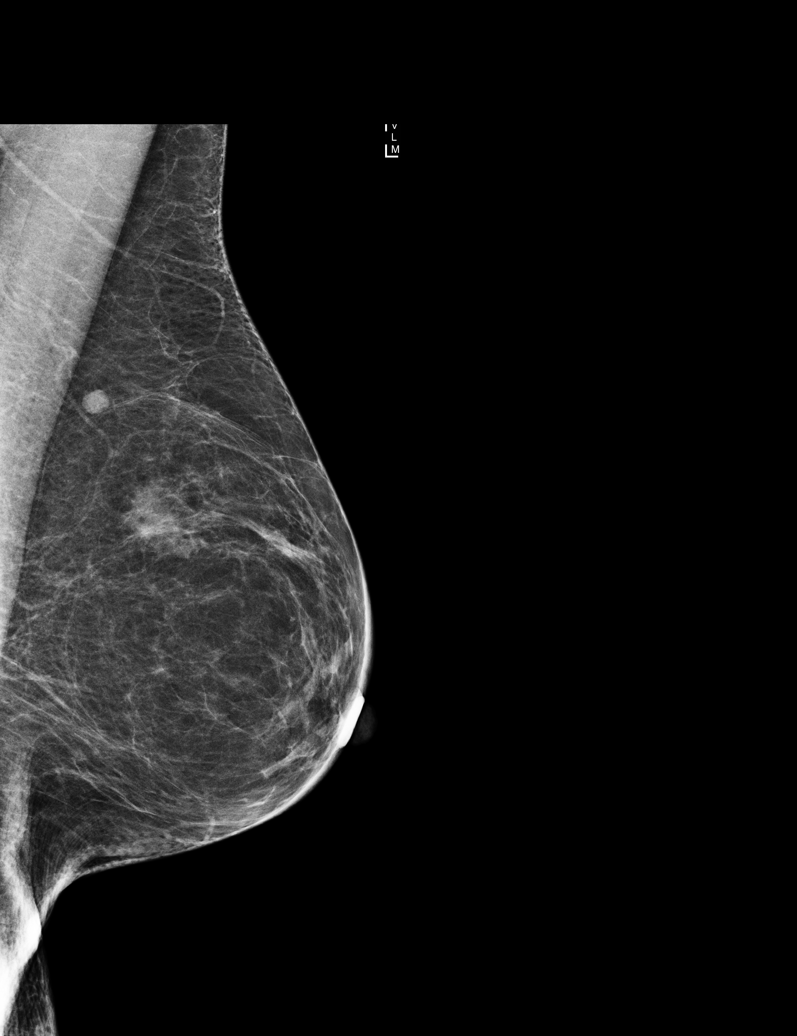

[L CC (1 of 2)]
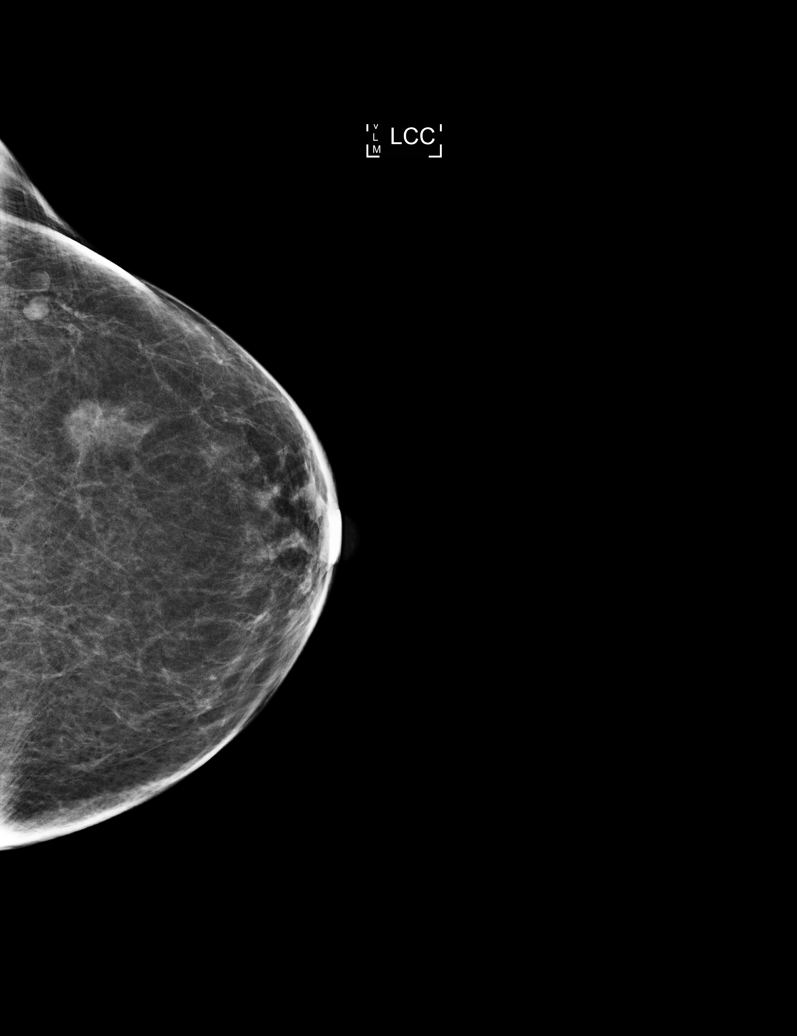

[R MLO]
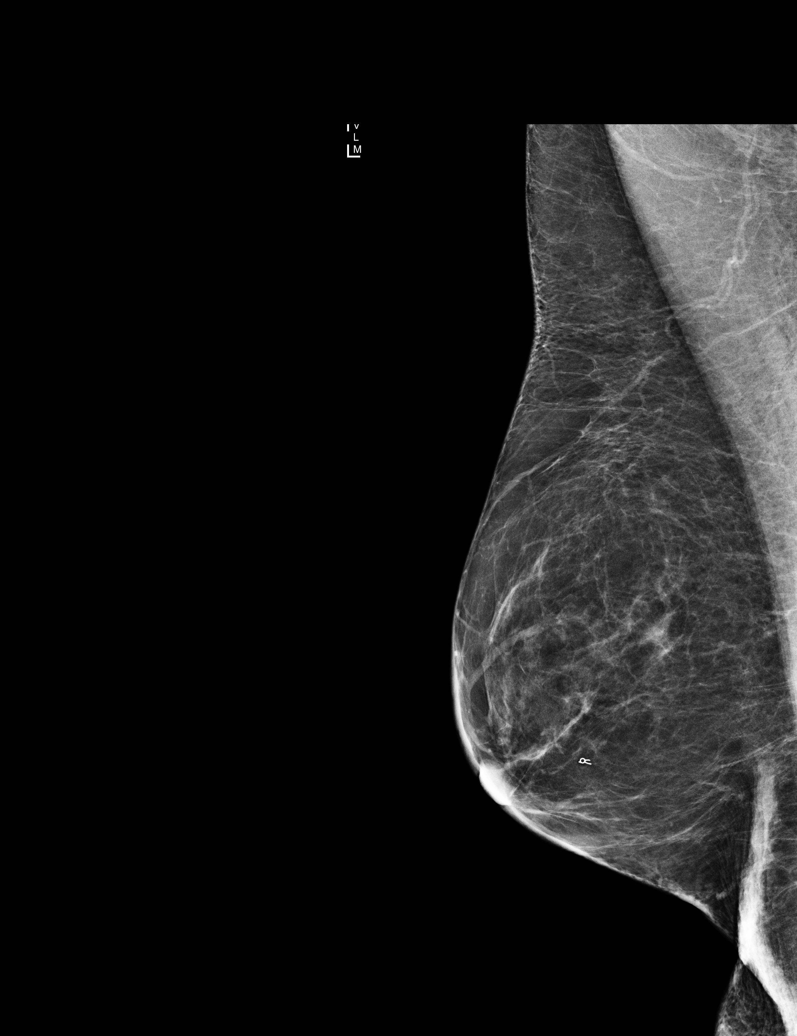

[L CC (2 of 2)]
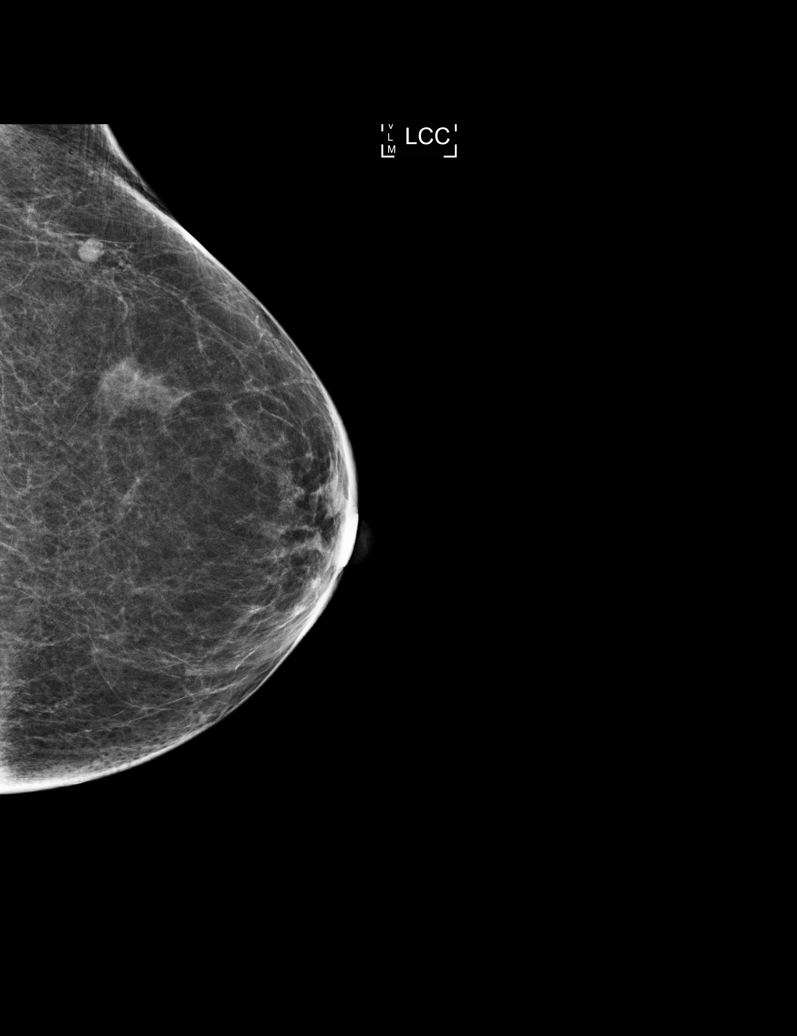

[R CC]
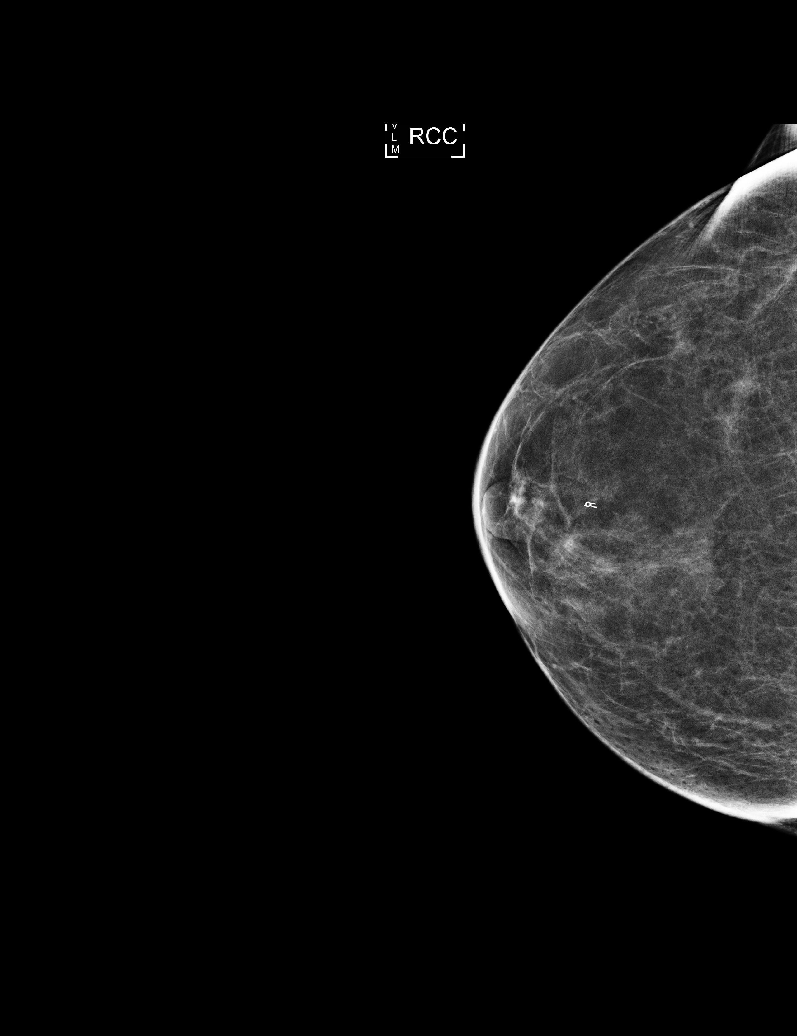

[5 of 5 positions shown; findings below may reference images not displayed]

ACR Breast Density Category b: There are scattered areas of
fibroglandular density.
FINDINGS: There are no findings suspicious for malignancy. Images were
processed with CAD.
IMPRESSION: No mammographic evidence of malignancy. A result letter of this
screening mammogram will be mailed directly to the patient.

RECOMMENDATION:
Screening mammogram in one year. (Code:AS-G-LCT)

BI-RADS CATEGORY  1: Negative.

## 2018-08-05 ENCOUNTER — Encounter (INDEPENDENT_AMBULATORY_CARE_PROVIDER_SITE_OTHER): Payer: Medicare Other | Admitting: Ophthalmology

## 2018-08-11 LAB — HM DIABETES EYE EXAM

## 2018-08-31 LAB — HM DIABETES EYE EXAM

## 2018-09-08 ENCOUNTER — Other Ambulatory Visit
Admission: RE | Admit: 2018-09-08 | Discharge: 2018-09-08 | Disposition: A | Discharge status: Home / Self Care | Payer: Medicare Other | Source: Ambulatory Visit | Attending: Internal Medicine | Admitting: Internal Medicine

## 2018-09-08 DIAGNOSIS — M7989 Other specified soft tissue disorders: Secondary | ICD-10-CM | POA: Insufficient documentation

## 2018-09-08 LAB — FIBRIN DERIVATIVES D-DIMER (ARMC ONLY): FIBRIN DERIVATIVES D-DIMER (ARMC): 482.1 ng{FEU}/mL (ref 0.00–499.00)

## 2019-05-06 IMAGING — MG MM DIGITAL SCREENING BILAT W/ CAD
4 series · 4 of 4 positions shown · non-contrast
Comparison: Previous exam(s).

CLINICAL DATA: Screening.

EXAM:
DIGITAL SCREENING BILATERAL MAMMOGRAM WITH CAD

[R MLO]
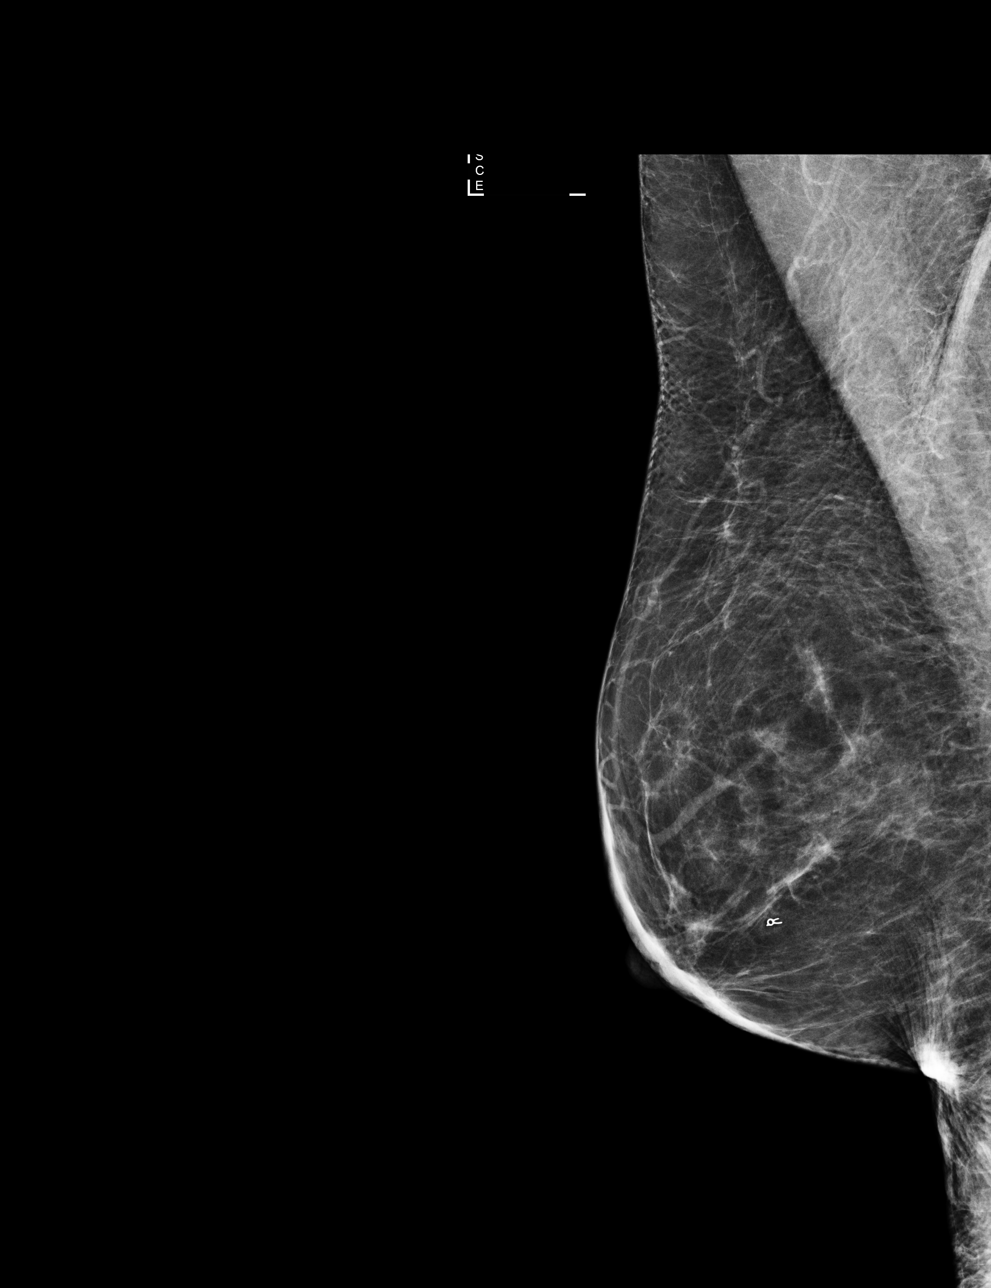

[R CC]
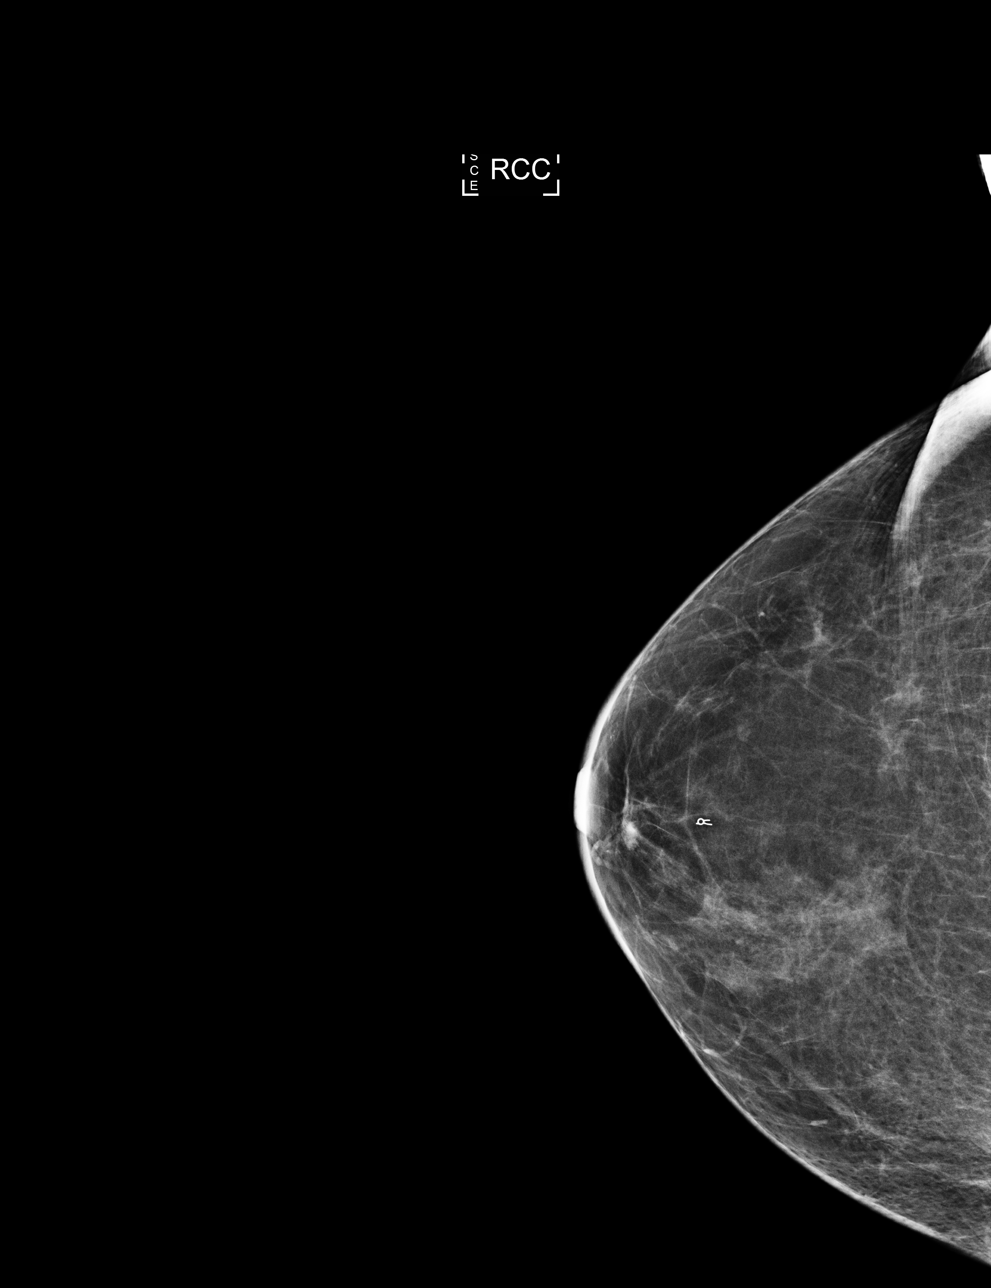

[L MLO]
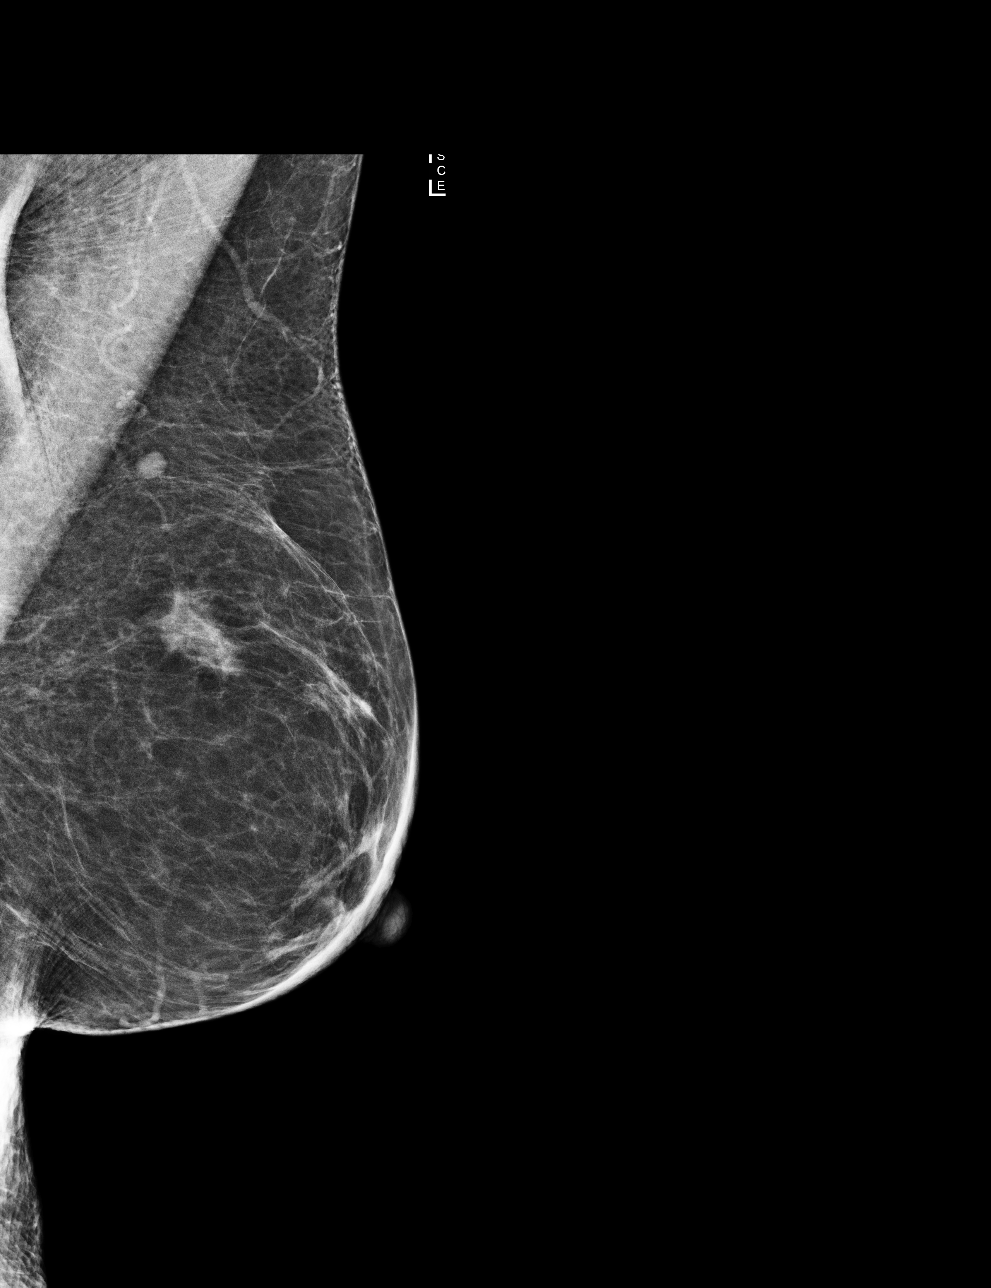

[L CC]
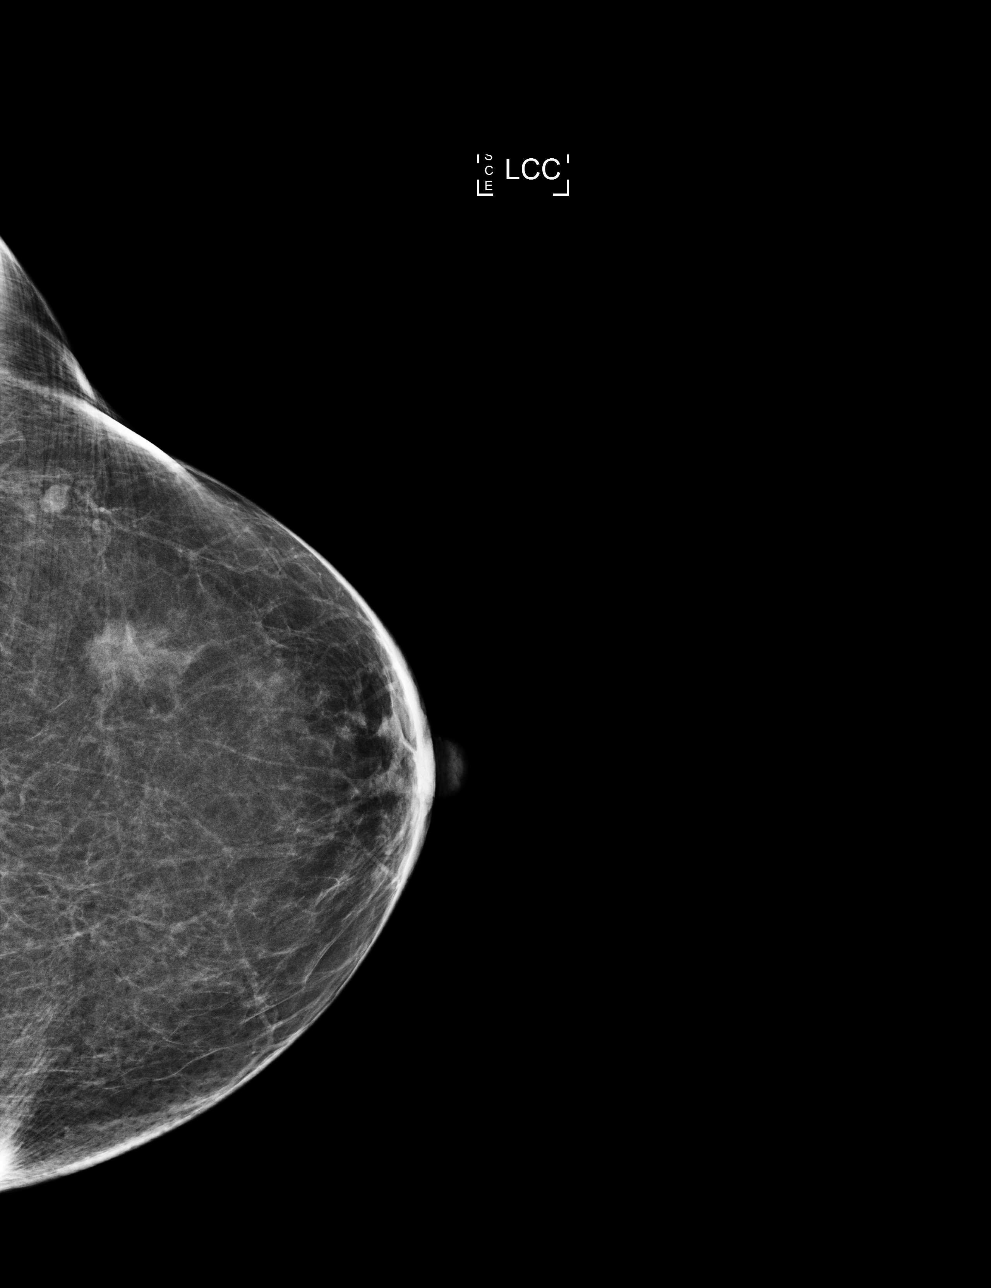

[4 of 4 positions shown; findings below may reference images not displayed]

ACR Breast Density Category b: There are scattered areas of
fibroglandular density.
FINDINGS: There are no findings suspicious for malignancy. Images were
processed with CAD.
IMPRESSION: No mammographic evidence of malignancy. A result letter of this
screening mammogram will be mailed directly to the patient.

RECOMMENDATION:
Screening mammogram in one year. (Code:AS-G-LCT)

BI-RADS CATEGORY  1: Negative.

## 2019-05-09 ENCOUNTER — Other Ambulatory Visit: Payer: Self-pay

## 2019-05-09 ENCOUNTER — Encounter: Payer: Self-pay | Admitting: Family Medicine

## 2019-05-09 ENCOUNTER — Ambulatory Visit (INDEPENDENT_AMBULATORY_CARE_PROVIDER_SITE_OTHER): Payer: Medicare Other | Admitting: Family Medicine

## 2019-05-09 VITALS — BP 152/70 | HR 68 | Temp 97.1°F | Ht 62.5 in | Wt 119.0 lb

## 2019-05-09 DIAGNOSIS — E119 Type 2 diabetes mellitus without complications: Secondary | ICD-10-CM

## 2019-05-09 DIAGNOSIS — Z1159 Encounter for screening for other viral diseases: Secondary | ICD-10-CM | POA: Diagnosis not present

## 2019-05-09 DIAGNOSIS — E1169 Type 2 diabetes mellitus with other specified complication: Secondary | ICD-10-CM | POA: Diagnosis not present

## 2019-05-09 DIAGNOSIS — Z862 Personal history of diseases of the blood and blood-forming organs and certain disorders involving the immune mechanism: Secondary | ICD-10-CM

## 2019-05-09 DIAGNOSIS — Z1231 Encounter for screening mammogram for malignant neoplasm of breast: Secondary | ICD-10-CM

## 2019-05-09 DIAGNOSIS — E785 Hyperlipidemia, unspecified: Secondary | ICD-10-CM

## 2019-05-09 DIAGNOSIS — Z87820 Personal history of traumatic brain injury: Secondary | ICD-10-CM | POA: Insufficient documentation

## 2019-05-09 LAB — POCT UA - MICROALBUMIN: Microalbumin Ur, POC: 20 mg/L
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
## 2019-05-09 NOTE — Assessment & Plan Note (Signed)
Previously well controlled with diet Reviewed last labs including A1c, lipid panel, CMP, CBC ROI sent for last eye exam Foot exam completed today Urine microalbumin collected today Discussed low-carb diet and importance of good blood glucose control for long-term health Discussed importance of exercise and decreasing blood glucose On statin Recheck A1c Follow-up in 6 months

## 2019-05-09 NOTE — Assessment & Plan Note (Signed)
Seems neuro intact today and at her baseline She does answer questions appropriately and is oriented, but does occasionally have to ask questions twice or be told instructions a couple of times She is present with her cousin who helps answer questions for her

## 2019-05-09 NOTE — Assessment & Plan Note (Signed)
Previously well controlled Discussed goal LDL less than 70 in the setting of diabetes Continue pravastatin Recheck lipid panel and CMP

## 2019-05-09 NOTE — Patient Instructions (Signed)
No changes to medications today  Call and schedule a mammogram  We will call you about the lab results when we have them back

## 2019-05-10 ENCOUNTER — Encounter: Payer: Self-pay | Admitting: Family Medicine

## 2019-05-12 ENCOUNTER — Encounter: Payer: Self-pay | Admitting: Family Medicine

## 2019-05-16 ENCOUNTER — Ambulatory Visit
Admission: RE | Admit: 2019-05-16 | Discharge: 2019-05-16 | Disposition: A | Discharge status: Home / Self Care | Payer: Medicare Other | Source: Ambulatory Visit | Attending: Family Medicine | Admitting: Family Medicine

## 2019-05-16 DIAGNOSIS — Z1231 Encounter for screening mammogram for malignant neoplasm of breast: Secondary | ICD-10-CM | POA: Diagnosis not present

## 2019-05-18 ENCOUNTER — Telehealth: Payer: Self-pay

## 2019-05-18 NOTE — Telephone Encounter (Signed)
NA

## 2019-05-18 NOTE — Telephone Encounter (Signed)
-----   Message from Virginia Crews, MD sent at 05/18/2019  9:02 AM EST ----- Normal mammogram. Repeat in 1 yr

## 2019-05-19 NOTE — Telephone Encounter (Signed)
Patient advised as below.  

## 2019-05-19 NOTE — Telephone Encounter (Signed)
Tried calling; pt's VM is not set up.  Thanks,   -Mickel Baas

## 2019-05-19 NOTE — Telephone Encounter (Signed)
NA

## 2019-05-19 NOTE — Telephone Encounter (Signed)
Pt returned call and request call back on her home#. Please advise. Thanks TNP

## 2019-05-26 ENCOUNTER — Telehealth: Payer: Self-pay

## 2019-05-26 DIAGNOSIS — E1169 Type 2 diabetes mellitus with other specified complication: Secondary | ICD-10-CM

## 2019-05-26 LAB — COMPREHENSIVE METABOLIC PANEL
ALT: 19 IU/L (ref 0–32)
AST: 16 IU/L (ref 0–40)
Albumin/Globulin Ratio: 2 (ref 1.2–2.2)
Albumin: 4.3 g/dL (ref 3.7–4.7)
Alkaline Phosphatase: 55 IU/L (ref 39–117)
BUN/Creatinine Ratio: 25 (ref 12–28)
BUN: 15 mg/dL (ref 8–27)
Bilirubin Total: 0.5 mg/dL (ref 0.0–1.2)
CO2: 23 mmol/L (ref 20–29)
Calcium: 9.1 mg/dL (ref 8.7–10.3)
Chloride: 103 mmol/L (ref 96–106)
Creatinine, Ser: 0.59 mg/dL (ref 0.57–1.00)
GFR calc Af Amer: 106 mL/min/{1.73_m2} (ref >59)
GFR calc non Af Amer: 92 mL/min/{1.73_m2} (ref >59)
Globulin, Total: 2.2 g/dL (ref 1.5–4.5)
Glucose: 123 mg/dL — ABNORMAL HIGH (ref 65–99)
Potassium: 4.1 mmol/L (ref 3.5–5.2)
Sodium: 140 mmol/L (ref 134–144)
Total Protein: 6.5 g/dL (ref 6.0–8.5)

## 2019-05-26 LAB — CBC
Hematocrit: 41.1 % (ref 34.0–46.6)
Hemoglobin: 13.7 g/dL (ref 11.1–15.9)
MCH: 31 pg (ref 26.6–33.0)
MCHC: 33.3 g/dL (ref 31.5–35.7)
MCV: 93 fL (ref 79–97)
Platelets: 198 10*3/uL (ref 150–450)
RBC: 4.42 x10E6/uL (ref 3.77–5.28)
RDW: 12.6 % (ref 11.7–15.4)
WBC: 6.7 10*3/uL (ref 3.4–10.8)

## 2019-05-26 LAB — LIPID PANEL
Chol/HDL Ratio: 3.4 ratio (ref 0.0–4.4)
Cholesterol, Total: 167 mg/dL (ref 100–199)
HDL: 49 mg/dL (ref >39)
LDL Chol Calc (NIH): 108 mg/dL — ABNORMAL HIGH (ref 0–99)
Triglycerides: 51 mg/dL (ref 0–149)
VLDL Cholesterol Cal: 10 mg/dL (ref 5–40)

## 2019-05-26 LAB — HEMOGLOBIN A1C
Est. average glucose Bld gHb Est-mCnc: 137 mg/dL
Hgb A1c MFr Bld: 6.4 % — ABNORMAL HIGH (ref 4.8–5.6)

## 2019-05-26 LAB — HEPATITIS C ANTIBODY: Hep C Virus Ab: <0.1 s/co ratio (ref 0.0–0.9)

## 2019-05-26 MED ORDER — ATORVASTATIN CALCIUM 40 MG PO TABS: 40.0000 mg | ORAL_TABLET | Freq: Every day | ORAL | 1 refills | Status: DC

## 2019-05-26 NOTE — Telephone Encounter (Signed)
Patient advised and agrees with this plan. Prescription sent into pharmacy.

## 2019-05-26 NOTE — Telephone Encounter (Signed)
-----   Message from Virginia Crews, MD sent at 05/26/2019 11:22 AM EST ----- Normal labs.  A1c remains well controlled at 6.4.  Cholesterol is not to goal in the setting of diabetes.  Recommend switching from pravastatin to atorvastatin for better control of this.  If patient agrees, recommend atorvastatin 40 mg daily.  Recommend rechecking lipids at next visit

## 2019-08-08 ENCOUNTER — Encounter: Payer: Self-pay | Admitting: Family Medicine

## 2019-08-09 ENCOUNTER — Encounter (INDEPENDENT_AMBULATORY_CARE_PROVIDER_SITE_OTHER): Payer: Medicare Other | Admitting: Ophthalmology

## 2019-08-09 ENCOUNTER — Other Ambulatory Visit: Payer: Self-pay

## 2019-08-09 DIAGNOSIS — H2512 Age-related nuclear cataract, left eye: Secondary | ICD-10-CM

## 2019-08-09 DIAGNOSIS — H43813 Vitreous degeneration, bilateral: Secondary | ICD-10-CM | POA: Diagnosis not present

## 2019-08-09 DIAGNOSIS — D3131 Benign neoplasm of right choroid: Secondary | ICD-10-CM | POA: Diagnosis not present

## 2019-08-09 DIAGNOSIS — H338 Other retinal detachments: Secondary | ICD-10-CM | POA: Diagnosis not present

## 2019-08-15 ENCOUNTER — Ambulatory Visit (INDEPENDENT_AMBULATORY_CARE_PROVIDER_SITE_OTHER): Payer: Medicare Other | Admitting: Family Medicine

## 2019-08-15 ENCOUNTER — Encounter: Payer: Self-pay | Admitting: Family Medicine

## 2019-08-15 ENCOUNTER — Other Ambulatory Visit: Payer: Self-pay

## 2019-08-15 VITALS — BP 129/63 | HR 61 | Temp 96.9°F | Resp 16 | Wt 126.0 lb

## 2019-08-15 DIAGNOSIS — F4323 Adjustment disorder with mixed anxiety and depressed mood: Secondary | ICD-10-CM

## 2019-08-15 DIAGNOSIS — Z87820 Personal history of traumatic brain injury: Secondary | ICD-10-CM | POA: Diagnosis not present

## 2019-08-15 DIAGNOSIS — R413 Other amnesia: Secondary | ICD-10-CM

## 2019-08-15 MED ORDER — SERTRALINE HCL 50 MG PO TABS: 50.0000 mg | ORAL_TABLET | Freq: Every day | ORAL | 3 refills | Status: DC

## 2019-08-15 NOTE — Patient Instructions (Signed)
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
## 2019-08-22 LAB — HM DIABETES EYE EXAM

## 2019-08-27 ENCOUNTER — Ambulatory Visit: Payer: Medicare Other | Attending: Internal Medicine

## 2019-08-27 DIAGNOSIS — Z23 Encounter for immunization: Secondary | ICD-10-CM

## 2019-08-27 NOTE — Progress Notes (Signed)
   Covid-19 Vaccination Clinic  Name:  Terry Watson    MRN: [id number] DOB: Jun 08, 1946  08/27/2019  [name] was observed post Covid-19 immunization for 15 minutes without incidence. She was provided with Vaccine Information Sheet and instruction to access the V-Safe system.   [name] was instructed to call 911 with any severe reactions post vaccine: Marland Kitchen Difficulty breathing  . Swelling of your face and throat  . A fast heartbeat  . A bad rash all over your body  . Dizziness and weakness    Immunizations Administered    Name Date Dose VIS Date Route   Pfizer COVID-19 Vaccine 08/27/2019 10:31 AM 0.3 mL 06/17/2019 Intramuscular   Manufacturer: [place]   Lot: [id number]   [place]: [id number]

## 2019-09-05 ENCOUNTER — Other Ambulatory Visit: Payer: Self-pay | Admitting: Acute Care

## 2019-09-05 DIAGNOSIS — R413 Other amnesia: Secondary | ICD-10-CM

## 2019-09-05 LAB — CBC AND DIFFERENTIAL
HCT: 45 (ref 36–46)
Hemoglobin: 14.3 (ref 12.0–16.0)
Platelets: 198 (ref 150–399)
WBC: 5.9

## 2019-09-05 LAB — BASIC METABOLIC PANEL
BUN: 16 (ref 4–21)
CO2: 33 — AB (ref 13–22)
Chloride: 102 (ref 99–108)
Creatinine: 0.6 (ref 0.5–1.1)
Glucose: 269
Potassium: 4.2 (ref 3.4–5.3)
Sodium: 138 (ref 137–147)

## 2019-09-05 LAB — COMPREHENSIVE METABOLIC PANEL
Albumin: 4.1 (ref 3.5–5.0)
Calcium: 9.3 (ref 8.7–10.7)

## 2019-09-05 LAB — TSH: TSH: 1.62 (ref 0.41–5.90)

## 2019-09-05 LAB — VITAMIN B12: Vitamin B-12: 575

## 2019-09-05 LAB — HEPATIC FUNCTION PANEL
ALT: 22 (ref 7–35)
AST: 14 (ref 13–35)
Alkaline Phosphatase: 58 (ref 25–125)

## 2019-09-05 LAB — VITAMIN D 25 HYDROXY (VIT D DEFICIENCY, FRACTURES): Vit D, 25-Hydroxy: 28

## 2019-09-05 LAB — CBC: RBC: 4.59 (ref 3.87–5.11)

## 2019-09-05 LAB — HEMOGLOBIN A1C: Hemoglobin A1C: 7.9

## 2019-09-07 NOTE — Anesthesia Preprocedure Evaluation (Addendum)
Anesthesia Evaluation  Patient identified by MRN, date of birth, ID band Patient awake    Reviewed: Allergy & Precautions, NPO status , Patient's Chart, lab work & pertinent test results  History of Anesthesia Complications Negative for: history of anesthetic complications  Airway Mallampati: II  TM Distance: >3 FB Neck ROM: Full    Dental   Pulmonary    breath sounds clear to auscultation       Cardiovascular (-) angina(-) DOE  Rhythm:Regular Rate:Normal   HLD   Neuro/Psych PSYCHIATRIC DISORDERS Anxiety Depression  H/o TBI Memory loss    GI/Hepatic neg GERD  ,  Endo/Other  diabetes  Renal/GU      Musculoskeletal   Abdominal   Peds  Hematology   Anesthesia Other Findings   Reproductive/Obstetrics                           Anesthesia Physical Anesthesia Plan  ASA: II  Anesthesia Plan: MAC   Post-op Pain Management:    Induction: Intravenous  PONV Risk Score and Plan: 2 and TIVA, Midazolam and Treatment may vary due to age or medical condition  Airway Management Planned: Nasal Cannula  Additional Equipment:   Intra-op Plan:   Post-operative Plan:   Informed Consent: I have reviewed the patients History and Physical, chart, labs and discussed the procedure including the risks, benefits and alternatives for the proposed anesthesia with the patient or authorized representative who has indicated his/her understanding and acceptance.       Plan Discussed with: CRNA and Anesthesiologist  Anesthesia Plan Comments:        Anesthesia Quick Evaluation

## 2019-09-08 ENCOUNTER — Encounter: Payer: Self-pay | Admitting: Family Medicine

## 2019-09-08 ENCOUNTER — Encounter: Payer: Self-pay | Admitting: Ophthalmology

## 2019-09-08 ENCOUNTER — Other Ambulatory Visit: Payer: Self-pay

## 2019-09-12 ENCOUNTER — Other Ambulatory Visit
Admission: RE | Admit: 2019-09-12 | Discharge: 2019-09-12 | Disposition: A | Discharge status: Home / Self Care | Payer: Medicare Other | Source: Ambulatory Visit | Attending: Ophthalmology | Admitting: Ophthalmology

## 2019-09-12 ENCOUNTER — Encounter: Payer: Self-pay | Admitting: Family Medicine

## 2019-09-12 ENCOUNTER — Ambulatory Visit (INDEPENDENT_AMBULATORY_CARE_PROVIDER_SITE_OTHER): Payer: Medicare Other | Admitting: Family Medicine

## 2019-09-12 VITALS — Wt 129.4 lb

## 2019-09-12 DIAGNOSIS — E1165 Type 2 diabetes mellitus with hyperglycemia: Secondary | ICD-10-CM

## 2019-09-12 DIAGNOSIS — R413 Other amnesia: Secondary | ICD-10-CM

## 2019-09-12 DIAGNOSIS — Z01812 Encounter for preprocedural laboratory examination: Secondary | ICD-10-CM | POA: Insufficient documentation

## 2019-09-12 DIAGNOSIS — Z20822 Contact with and (suspected) exposure to covid-19: Secondary | ICD-10-CM | POA: Insufficient documentation

## 2019-09-12 DIAGNOSIS — F4323 Adjustment disorder with mixed anxiety and depressed mood: Secondary | ICD-10-CM | POA: Diagnosis not present

## 2019-09-12 MED ORDER — METFORMIN HCL 500 MG PO TABS: 500.0000 mg | ORAL_TABLET | Freq: Two times a day (BID) | ORAL | 3 refills | Status: DC

## 2019-09-12 NOTE — Discharge Instructions (Signed)

## 2019-09-12 NOTE — Assessment & Plan Note (Signed)
Being worked up by neurology Upcoming MRI We will continue to follow work-up and plan per neurology

## 2019-09-12 NOTE — Progress Notes (Signed)
Patient: Terry Watson Female    DOB: 07/07/1946   74 y.o.   MRN: [id number] Visit Date: 09/12/2019  Today's Provider: Lavon Paganini, MD   Chief Complaint  Patient presents with  . Follow-up  . Anxiety  . Depression   Subjective:    I [place] S. Dimas, CMA, am acting as scribe for Lavon Paganini, MD.  Virtual Visit via Telephone Note I connected with Cleda Daub on 09/12/19 at  9:40 AM EST by telephone and verified that I am speaking with the correct person using two identifiers.  Location: Patient location: home Provider location: [facility] Persons involved in the visit: patient, provider, patient's son Shanon Brow, Medical student Quillian Quince   I discussed the limitations, risks, security and privacy concerns of performing an evaluation and management service by telephone and the availability of in person appointments. I also discussed with the patient that there may be a patient responsible charge related to this service. The patient expressed understanding and agreed to proceed.  HPI  Follow up for Memory loss  The patient was last seen for this 1 months ago. Changes made at last visit include refer to neurology.  She reports excellent compliance with treatment. She feels that condition is Improved. She is not having side effects.  ------------------------------------------------------------------------------------   Follow up for anxiety and depression  The patient was last seen for this 1 months ago. Changes made at last visit include start low-dose zoloft 50 mg daily.  She reports excellent compliance with treatment. She feels that condition is Improved. She is not having side effects.   GAD 7 : Generalized Anxiety Score 09/12/2019 08/15/2019  Nervous, Anxious, on Edge 0 0  Control/stop worrying 0 0  Worry too much - different things 0 0  Trouble relaxing 0 1  Restless 0 0  Easily annoyed or irritable 0 0  Afraid - awful might happen  0 0  Total GAD 7 Score 0 1  Anxiety Difficulty Not difficult at all Not difficult at all    Depression screen [facility] 2/9 09/12/2019 08/15/2019 05/09/2019  Decreased Interest 0 0 0  Down, Depressed, Hopeless 0 0 0  PHQ - 2 Score 0 0 0  Altered sleeping 0 1 3  Tired, decreased energy 0 0 0  Change in appetite 0 0 0  Feeling bad or failure about yourself  0 0 0  Trouble concentrating 0 0 0  Moving slowly or fidgety/restless 0 0 0  Suicidal thoughts 0 0 0  PHQ-9 Score 0 1 3  Difficult doing work/chores Not difficult at all Not difficult at all Not difficult at all   ------------------------------------------------------------------------------------ T2DM - Checking BG at home: no - Medications: none currently - Compliance: n/a - Diet: no change - eye exam, foot exam, microalbumin: UTD - denies symptoms of hypoglycemia, polyuria, polydipsia, numbness extremities, foot ulcers/trauma    No Known Allergies   Current Outpatient Medications:  .  atorvastatin (LIPITOR) 40 MG tablet, Take 1 tablet (40 mg total) by mouth daily., Disp: 90 tablet, Rfl: 1 .  Calcium Carbonate-Vitamin D (CALCIUM 600+D) 600-200 MG-UNIT TABS, Take by mouth., Disp: , Rfl:  .  Melatonin 5 MG TABS, Take by mouth., Disp: , Rfl:  .  Multiple Vitamin (MULTIVITAMIN) tablet, Take 1 tablet by mouth daily., Disp: , Rfl:  .  niacin 500 MG tablet, Take 500 mg by mouth at bedtime., Disp: , Rfl:  .  sertraline (ZOLOFT) 50 MG tablet, Take 1 tablet (  50 mg total) by mouth daily., Disp: 30 tablet, Rfl: 3 .  metFORMIN (GLUCOPHAGE) 500 MG tablet, Take 1 tablet (500 mg total) by mouth 2 (two) times daily with a meal., Disp: 60 tablet, Rfl: 3  Review of Systems  Constitutional: Negative.   Cardiovascular: Negative.   Psychiatric/Behavioral: Negative.     Social History   Tobacco Use  . Smoking status: Never Smoker  . Smokeless tobacco: Never Used  Substance Use Topics  . Alcohol use: Never      Objective:   Wt 129 lb 6.4 oz  (58.7 kg)   BMI 23.67 kg/m  Vitals:   09/12/19 0933  Weight: 129 lb 6.4 oz (58.7 kg)  Body mass index is 23.67 kg/m.   Physical Exam   No results found for any visits on 09/12/19.     Assessment & Plan      I discussed the assessment and treatment plan with the patient. The patient was provided an opportunity to ask questions and all were answered. The patient agreed with the plan and demonstrated an understanding of the instructions.   The patient was advised to call back or seek an in-person evaluation if the symptoms worsen or if the condition fails to improve as anticipated.  Problem List Items Addressed This Visit      Endocrine   T2DM (type 2 diabetes mellitus) ([place]) - Primary    Previously well controlled with diet, but uncontrolled on recent A1c of 7.9 We will abstract last labs from [facility] chart Up-to-date on foot exam, eye exam, microalbumin, vaccinations On statin Needs to start metformin 500 mg twice daily for better glycemic control Discussed low-carb diet and importance of good blood glucose control for long-term health Have previously discussed importance of exercise and decreasing blood glucose Follow-up in 3 months and repeat A1c at that visit      Relevant Medications   metFORMIN (GLUCOPHAGE) 500 MG tablet     Other   Memory loss of unknown cause    Being worked up by neurology Upcoming MRI We will continue to follow work-up and plan per neurology      Adjustment disorder with mixed anxiety and depressed mood    Well-controlled Tolerating Zoloft well Continue 50 mg daily Follow-up in 3 months and repeat PHQ-9 and GAD-7 Have discussed importance and synergistic effect of therapy in the setting of medications previously          Return in about 3 months (around 12/13/2019) for chronic disease f/u.   The entirety of the information documented in the History of Present Illness, Review of Systems and Physical Exam were personally obtained by me.  Portions of this information were initially documented by Lynford Humphrey, CMA and reviewed by me for thoroughness and accuracy.    Kadar Chance, Dionne Bucy, MD MPH [facility]

## 2019-09-12 NOTE — Assessment & Plan Note (Signed)
Well-controlled Tolerating Zoloft well Continue 50 mg daily Follow-up in 3 months and repeat PHQ-9 and GAD-7 Have discussed importance and synergistic effect of therapy in the setting of medications previously

## 2019-09-12 NOTE — Assessment & Plan Note (Signed)
Previously well controlled with diet, but uncontrolled on recent A1c of 7.9 We will abstract last labs from [facility] chart Up-to-date on foot exam, eye exam, microalbumin, vaccinations On statin Needs to start metformin 500 mg twice daily for better glycemic control Discussed low-carb diet and importance of good blood glucose control for long-term health Have previously discussed importance of exercise and decreasing blood glucose Follow-up in 3 months and repeat A1c at that visit

## 2019-09-13 LAB — SARS CORONAVIRUS 2 (TAT 6-24 HRS): SARS Coronavirus 2: NEGATIVE

## 2019-09-14 ENCOUNTER — Ambulatory Visit: Payer: Medicare Other | Admitting: Anesthesiology

## 2019-09-14 ENCOUNTER — Encounter: Payer: Self-pay | Admitting: Ophthalmology

## 2019-09-14 ENCOUNTER — Other Ambulatory Visit: Payer: Self-pay

## 2019-09-14 ENCOUNTER — Ambulatory Visit
Admission: RE | Admit: 2019-09-14 | Discharge: 2019-09-14 | Disposition: A | Discharge status: Home / Self Care | Payer: Medicare Other | Attending: Ophthalmology | Admitting: Ophthalmology

## 2019-09-14 ENCOUNTER — Encounter
Admission: RE | Disposition: A | Discharge status: Home / Self Care | Payer: Self-pay | Source: Home / Self Care | Attending: Ophthalmology

## 2019-09-14 DIAGNOSIS — E1136 Type 2 diabetes mellitus with diabetic cataract: Secondary | ICD-10-CM | POA: Diagnosis not present

## 2019-09-14 DIAGNOSIS — E7800 Pure hypercholesterolemia, unspecified: Secondary | ICD-10-CM | POA: Insufficient documentation

## 2019-09-14 DIAGNOSIS — Z85828 Personal history of other malignant neoplasm of skin: Secondary | ICD-10-CM | POA: Insufficient documentation

## 2019-09-14 DIAGNOSIS — I10 Essential (primary) hypertension: Secondary | ICD-10-CM | POA: Diagnosis not present

## 2019-09-14 DIAGNOSIS — H2512 Age-related nuclear cataract, left eye: Secondary | ICD-10-CM | POA: Diagnosis not present

## 2019-09-14 HISTORY — DX: Presence of dental prosthetic device (complete) (partial): Z97.2

## 2019-09-14 HISTORY — PX: CATARACT EXTRACTION W/PHACO: SHX586

## 2019-09-14 HISTORY — DX: Other amnesia: R41.3

## 2019-09-14 LAB — GLUCOSE, CAPILLARY
Glucose-Capillary: 176 mg/dL — ABNORMAL HIGH (ref 70–99)
Glucose-Capillary: 182 mg/dL — ABNORMAL HIGH (ref 70–99)

## 2019-09-14 SURGERY — PHACOEMULSIFICATION, CATARACT, WITH IOL INSERTION
Anesthesia: Monitor Anesthesia Care | Site: Eye | Laterality: Left

## 2019-09-14 MED ORDER — MOXIFLOXACIN HCL 0.5 % OP SOLN
1.0000 [drp] | OPHTHALMIC | Status: DC | PRN
  Administered 2019-09-14 (×3): 1 [drp] via OPHTHALMIC

## 2019-09-14 MED ORDER — BRIMONIDINE TARTRATE-TIMOLOL 0.2-0.5 % OP SOLN
OPHTHALMIC | Status: DC | PRN
  Administered 2019-09-14: 1 [drp] via OPHTHALMIC

## 2019-09-14 MED ORDER — ACETAMINOPHEN 10 MG/ML IV SOLN: 1000.0000 mg | Freq: Once | INTRAVENOUS | Status: DC | PRN

## 2019-09-14 MED ORDER — CEFUROXIME OPHTHALMIC INJECTION 1 MG/0.1 ML
INJECTION | OPHTHALMIC | Status: DC | PRN
  Administered 2019-09-14: 0.1 mL via INTRACAMERAL

## 2019-09-14 MED ORDER — LIDOCAINE HCL (PF) 2 % IJ SOLN
INTRAOCULAR | Status: DC | PRN
  Administered 2019-09-14: 2 mL

## 2019-09-14 MED ORDER — ARMC OPHTHALMIC DILATING DROPS
1.0000 "application " | OPHTHALMIC | Status: DC | PRN
  Administered 2019-09-14 (×3): 1 via OPHTHALMIC

## 2019-09-14 MED ORDER — TETRACAINE HCL 0.5 % OP SOLN
1.0000 [drp] | OPHTHALMIC | Status: DC | PRN
  Administered 2019-09-14 (×3): 1 [drp] via OPHTHALMIC

## 2019-09-14 MED ORDER — ARMC OPHTHALMIC DILATING DROPS
1.0000 "application " | OPHTHALMIC | Status: DC | PRN
  Administered 2019-09-14 (×2): 1 via OPHTHALMIC

## 2019-09-14 MED ORDER — NA HYALUR & NA CHOND-NA HYALUR 0.4-0.35 ML IO KIT
PACK | INTRAOCULAR | Status: DC | PRN
  Administered 2019-09-14: 1 mL via INTRAOCULAR

## 2019-09-14 MED ORDER — LACTATED RINGERS IV SOLN: 100.0000 mL/h | INTRAVENOUS | Status: DC

## 2019-09-14 MED ORDER — EPINEPHRINE PF 1 MG/ML IJ SOLN
INTRAOCULAR | Status: DC | PRN
  Administered 2019-09-14: 42 mL via OPHTHALMIC

## 2019-09-14 MED ORDER — MOXIFLOXACIN HCL 0.5 % OP SOLN
1.0000 [drp] | OPHTHALMIC | Status: DC | PRN
  Administered 2019-09-14 (×2): 1 [drp] via OPHTHALMIC

## 2019-09-14 MED ORDER — ONDANSETRON HCL 4 MG/2ML IJ SOLN: 4.0000 mg | Freq: Once | INTRAMUSCULAR | Status: DC | PRN

## 2019-09-14 MED ORDER — FENTANYL CITRATE (PF) 100 MCG/2ML IJ SOLN
INTRAMUSCULAR | Status: DC | PRN
  Administered 2019-09-14: 50 ug via INTRAVENOUS

## 2019-09-14 MED ORDER — TETRACAINE HCL 0.5 % OP SOLN
1.0000 [drp] | OPHTHALMIC | Status: DC | PRN
  Administered 2019-09-14: 1 [drp] via OPHTHALMIC

## 2019-09-14 SURGICAL SUPPLY — 23 items
CANNULA ANT/CHMB 27G (MISCELLANEOUS) ×1 IMPLANT
CANNULA ANT/CHMB 27GA (MISCELLANEOUS) ×3 IMPLANT
GLOVE SURG LX 7.5 STRW (GLOVE) ×2
GLOVE SURG LX STRL 7.5 STRW (GLOVE) ×1 IMPLANT
GLOVE SURG TRIUMPH 8.0 PF LTX (GLOVE) ×3 IMPLANT
GOWN STRL REUS W/ TWL LRG LVL3 (GOWN DISPOSABLE) ×2 IMPLANT
GOWN STRL REUS W/TWL LRG LVL3 (GOWN DISPOSABLE) ×4
LENS IOL DIOP 20.5 (Intraocular Lens) ×3 IMPLANT
LENS IOL TECNIS MONO 20.5 (Intraocular Lens) IMPLANT
MARKER SKIN DUAL TIP RULER LAB (MISCELLANEOUS) ×3 IMPLANT
NDL CAPSULORHEX 25GA (NEEDLE) ×1 IMPLANT
NDL FILTER BLUNT 18X1 1/2 (NEEDLE) ×2 IMPLANT
NEEDLE CAPSULORHEX 25GA (NEEDLE) ×3 IMPLANT
NEEDLE FILTER BLUNT 18X 1/2SAF (NEEDLE) ×4
NEEDLE FILTER BLUNT 18X1 1/2 (NEEDLE) ×2 IMPLANT
PACK CATARACT BRASINGTON (MISCELLANEOUS) ×3 IMPLANT
PACK EYE AFTER SURG (MISCELLANEOUS) ×3 IMPLANT
PACK OPTHALMIC (MISCELLANEOUS) ×3 IMPLANT
SOLUTION OPHTHALMIC SALT (MISCELLANEOUS) ×3 IMPLANT
SYR 3ML LL SCALE MARK (SYRINGE) ×6 IMPLANT
SYR TB 1ML LUER SLIP (SYRINGE) ×3 IMPLANT
WATER STERILE IRR 250ML POUR (IV SOLUTION) ×3 IMPLANT
WIPE NON LINTING 3.25X3.25 (MISCELLANEOUS) ×3 IMPLANT

## 2019-09-14 NOTE — Op Note (Signed)
OPERATIVE NOTE  TAJHA MCCLURKIN [id number] 09/14/2019   PREOPERATIVE DIAGNOSIS:  Nuclear sclerotic cataract left eye. H25.12   POSTOPERATIVE DIAGNOSIS:    Nuclear sclerotic cataract left eye.     PROCEDURE:  Phacoemusification with posterior chamber intraocular lens placement of the left eye  Ultrasound time: Procedure(s): CATARACT EXTRACTION PHACO AND INTRAOCULAR LENS PLACEMENT (IOC) LEFT DIABETIC 10.04 00:58.6 17.1%  (Left)  LENS:   Implant Name Type Inv. Item Serial No. Manufacturer Lot No. LRB No. Used Action  LENS IOL DIOP 20.5 - [id number] Intraocular Lens LENS IOL DIOP 20.5 [id number]  Left 1 Implanted      SURGEON:  Wyonia Hough, MD   ANESTHESIA:  Topical with tetracaine drops and 2% Xylocaine jelly, augmented with 1% preservative-free intracameral lidocaine.    COMPLICATIONS:  None.   DESCRIPTION OF PROCEDURE:  The patient was identified in the holding room and transported to the operating room and placed in the supine position under the operating microscope.  The left eye was identified as the operative eye and it was prepped and draped in the usual sterile ophthalmic fashion.   A 1 millimeter clear-corneal paracentesis was made at the 1:30 position.  0.5 ml of preservative-free 1% lidocaine was injected into the anterior chamber.  The anterior chamber was filled with Viscoat viscoelastic.  A 2.4 millimeter keratome was used to make a near-clear corneal incision at the 10:30 position.  .  A curvilinear capsulorrhexis was made with a cystotome and capsulorrhexis forceps.  Balanced salt solution was used to hydrodissect and hydrodelineate the nucleus.   Phacoemulsification was then used in stop and chop fashion to remove the lens nucleus and epinucleus.  The remaining cortex was then removed using the irrigation and aspiration handpiece. Provisc was then placed into the capsular bag to distend it for lens placement.  A lens was then injected into the capsular bag.   The remaining viscoelastic was aspirated.   Wounds were hydrated with balanced salt solution.  The anterior chamber was inflated to a physiologic pressure with balanced salt solution.  No wound leaks were noted. Cefuroxime 0.1 ml of a 10mg /ml solution was injected into the anterior chamber for a dose of 1 mg of intracameral antibiotic at the completion of the case.   Timolol and Brimonidine drops were applied to the eye.  The patient was taken to the recovery room in stable condition without complications of anesthesia or surgery.  Siegfried Vieth 09/14/2019, 8:34 AM

## 2019-09-14 NOTE — Anesthesia Procedure Notes (Signed)
Procedure Name: MAC Date/Time: 09/14/2019 8:16 AM Performed by: Cameron Ali, CRNA Pre-anesthesia Checklist: Patient identified, Emergency Drugs available, Suction available, Timeout performed and Patient being monitored Patient Re-evaluated:Patient Re-evaluated prior to induction Oxygen Delivery Method: Nasal cannula Placement Confirmation: positive ETCO2

## 2019-09-14 NOTE — Anesthesia Postprocedure Evaluation (Signed)
Anesthesia Post Note  Patient: Terry Watson  Procedure(s) Performed: CATARACT EXTRACTION PHACO AND INTRAOCULAR LENS PLACEMENT (IOC) LEFT DIABETIC 10.04 00:58.6 17.1%  (Left Eye)     Patient location during evaluation: PACU Anesthesia Type: MAC Level of consciousness: awake and alert Pain management: pain level controlled Vital Signs Assessment: post-procedure vital signs reviewed and stable Respiratory status: spontaneous breathing, nonlabored ventilation, respiratory function stable and patient connected to nasal cannula oxygen Cardiovascular status: stable and blood pressure returned to baseline Postop Assessment: no apparent nausea or vomiting Anesthetic complications: no    Cabrina Shiroma A  Cheryal Salas

## 2019-09-14 NOTE — Transfer of Care (Signed)
Immediate Anesthesia Transfer of Care Note  Patient: Terry Watson  Procedure(s) Performed: CATARACT EXTRACTION PHACO AND INTRAOCULAR LENS PLACEMENT (IOC) LEFT DIABETIC 10.04 00:58.6 17.1%  (Left Eye)  Patient Location: PACU  Anesthesia Type: MAC  Level of Consciousness: awake, alert  and patient cooperative  Airway and Oxygen Therapy: Patient Spontanous Breathing and Patient connected to supplemental oxygen  Post-op Assessment: Post-op Vital signs reviewed, Patient's Cardiovascular Status Stable, Respiratory Function Stable, Patent Airway and No signs of Nausea or vomiting  Post-op Vital Signs: Reviewed and stable  Complications: No apparent anesthesia complications

## 2019-09-14 NOTE — H&P (Signed)

## 2019-09-15 ENCOUNTER — Encounter: Payer: Self-pay | Admitting: *Deleted

## 2019-09-15 ENCOUNTER — Ambulatory Visit: Payer: Medicare Other

## 2019-09-20 ENCOUNTER — Ambulatory Visit: Payer: Medicare Other | Attending: Internal Medicine

## 2019-09-20 DIAGNOSIS — Z23 Encounter for immunization: Secondary | ICD-10-CM

## 2019-09-20 NOTE — Progress Notes (Signed)
   Covid-19 Vaccination Clinic  Name:  MAIANH MALONSON    MRN: [id number] DOB: 12/28/1945  09/20/2019  Ms. Knabel was observed post Covid-19 immunization for 15 minutes without incident. She was provided with Vaccine Information Sheet and instruction to access the V-Safe system.   Ms. Benedick was instructed to call 911 with any severe reactions post vaccine: Marland Kitchen Difficulty breathing  . Swelling of face and throat  . A fast heartbeat  . A bad rash all over body  . Dizziness and weakness   Immunizations Administered    Name Date Dose VIS Date Route   Pfizer COVID-19 Vaccine 09/20/2019  9:42 AM 0.3 mL 06/17/2019 Intramuscular   Manufacturer: [place]   Lot: [id number]   [place]: [id number]

## 2019-09-23 ENCOUNTER — Ambulatory Visit
Admission: RE | Admit: 2019-09-23 | Discharge: 2019-09-23 | Disposition: A | Discharge status: Home / Self Care | Payer: Medicare Other | Source: Ambulatory Visit | Attending: Acute Care | Admitting: Acute Care

## 2019-09-23 ENCOUNTER — Other Ambulatory Visit: Payer: Self-pay

## 2019-09-23 DIAGNOSIS — R413 Other amnesia: Secondary | ICD-10-CM | POA: Insufficient documentation

## 2019-09-29 ENCOUNTER — Telehealth: Payer: Self-pay

## 2019-09-29 NOTE — Telephone Encounter (Signed)
Copied from [place] [id number]. Topic: General - Other >> Sep 29, 2019  8:21 AM Leward Quan A wrote: Reason for CRM: Patient daughter in law Aalaiyah Neiss called to inquire of Dr B why does the patient have back to back appointments in May and June asking if the appointments can be combined and the one in June cancelled. Please advise Kinzee Debarr can be reached at Ph# [phone]

## 2019-09-29 NOTE — Telephone Encounter (Signed)
Terry Watson is scheduled on 11/07/2019 for AWV and fu visit. On 12/13/2019 is for 3 month fu on diabetes from 09/12/2019. Please advise.

## 2019-09-29 NOTE — Telephone Encounter (Signed)
Patty advised.   Thanks,   -Mickel Baas

## 2019-09-29 NOTE — Telephone Encounter (Signed)
Okay to cancel the June appointment.  We will see her in May

## 2019-10-27 DIAGNOSIS — Z8673 Personal history of transient ischemic attack (TIA), and cerebral infarction without residual deficits: Secondary | ICD-10-CM | POA: Insufficient documentation

## 2019-11-02 ENCOUNTER — Ambulatory Visit: Payer: Medicare Other

## 2019-11-04 NOTE — Progress Notes (Signed)
See note above by Chipper Herb, medical student, cosigned by me

## 2019-11-07 ENCOUNTER — Encounter: Payer: Self-pay | Admitting: Family Medicine

## 2019-11-07 ENCOUNTER — Ambulatory Visit: Payer: Medicare Other

## 2019-11-07 ENCOUNTER — Other Ambulatory Visit: Payer: Self-pay

## 2019-11-07 ENCOUNTER — Ambulatory Visit (INDEPENDENT_AMBULATORY_CARE_PROVIDER_SITE_OTHER): Payer: Medicare Other | Admitting: Family Medicine

## 2019-11-07 VITALS — BP 118/67 | HR 66 | Temp 97.1°F | Resp 16 | Ht 63.0 in | Wt 125.2 lb

## 2019-11-07 DIAGNOSIS — F4323 Adjustment disorder with mixed anxiety and depressed mood: Secondary | ICD-10-CM | POA: Diagnosis not present

## 2019-11-07 DIAGNOSIS — E1165 Type 2 diabetes mellitus with hyperglycemia: Secondary | ICD-10-CM | POA: Diagnosis not present

## 2019-11-07 DIAGNOSIS — I639 Cerebral infarction, unspecified: Secondary | ICD-10-CM | POA: Insufficient documentation

## 2019-11-07 DIAGNOSIS — E785 Hyperlipidemia, unspecified: Secondary | ICD-10-CM

## 2019-11-07 DIAGNOSIS — R413 Other amnesia: Secondary | ICD-10-CM

## 2019-11-07 DIAGNOSIS — E1169 Type 2 diabetes mellitus with other specified complication: Secondary | ICD-10-CM

## 2019-11-07 DIAGNOSIS — I10 Essential (primary) hypertension: Secondary | ICD-10-CM | POA: Insufficient documentation

## 2019-11-07 NOTE — Assessment & Plan Note (Signed)
Pt with history of adjustment disorder due to husband passing Symptoms are controlled on medication Pt is taking zoloft and would like to stay on medication   Plan: Will repeat PHQ9 and GAD7 at next visit Will continue taking zoloft as prescribed

## 2019-11-07 NOTE — Progress Notes (Signed)
Established patient visit   Patient: Terry Watson   DOB: 07-09-1945   74 y.o. Female  MRN: [id number] Visit Date: 11/07/2019  Today's healthcare provider: Lavon Paganini, MD   Chief Complaint  Patient presents with  . Diabetes  . Anxiety   Subjective    HPI  Diabetes Mellitus Type II, Follow-up:   Lab Results  Component Value Date   HGBA1C 7.9 09/05/2019   HGBA1C 6.4 (H) 05/25/2019   HGBA1C 6.4 05/03/2018    Last seen for diabetes 2 months ago.  Management since then includes starting metformin. She reports excellent compliance with treatment. She is not having side effects.  Current symptoms include none and have been stable. Home blood sugar records: not taken at home  Episodes of hypoglycemia? no   Current insulin regiment: Is not on insulin Pt sees eye doctor regularly Weight trend: stable Prior visit with dietician: No Current exercise: walking Current diet habits: in general, a "healthy" diet    Pertinent Labs:    Component Value Date/Time   CHOL 167 05/25/2019 0932   TRIG 51 05/25/2019 0932   HDL 49 05/25/2019 0932   LDLCALC 108 (H) 05/25/2019 0932   CREATININE 0.6 09/05/2019 0000   CREATININE 0.59 05/25/2019 0932    Wt Readings from Last 3 Encounters:  11/07/19 125 lb 3.2 oz (56.8 kg)  09/14/19 130 lb (59 kg)  09/12/19 129 lb 6.4 oz (58.7 kg)    ------------------------------------------------------------------------  Lipid/Cholesterol, Follow-up:   Last seen for this2 months ago.  Management changes since that visit include switching to atorvastatin. . Last Lipid Panel:    Component Value Date/Time   CHOL 167 05/25/2019 0932   TRIG 51 05/25/2019 0932   HDL 49 05/25/2019 0932   CHOLHDL 3.4 05/25/2019 0932   LDLCALC 108 (H) 05/25/2019 0932    Risk factors for vascular disease include hypercholesterolemia  She reports excellent compliance with treatment. She is not having side effects.  Current symptoms include none and  have been stable. Weight trend: stable Prior visit with dietician: no Current diet: in general, a "healthy" diet   Current exercise: walking  Wt Readings from Last 3 Encounters:  11/07/19 125 lb 3.2 oz (56.8 kg)  09/14/19 130 lb (59 kg)  09/12/19 129 lb 6.4 oz (58.7 kg)    -------------------------------------------------------------------  Depression, Follow-up  She  was last seen for this 2 months ago. Changes made at last visit include continuing on SSRI. Pt states that symptoms of anxiety and depression due to her husband's passing have improved. Son is in the room and agrees that pt is doing well emotionally    She reports excellent compliance with treatment. She is not having side effects.   She reports excellent tolerance of treatment. Current symptoms include none She feels she is Improved since last visit.   Depression screen [facility] 2/9 09/12/2019 08/15/2019 05/09/2019  Decreased Interest 0 0 0  Down, Depressed, Hopeless 0 0 0  PHQ - 2 Score 0 0 0  Altered sleeping 0 1 3  Tired, decreased energy 0 0 0  Change in appetite 0 0 0  Feeling bad or failure about yourself  0 0 0  Trouble concentrating 0 0 0  Moving slowly or fidgety/restless 0 0 0  Suicidal thoughts 0 0 0  PHQ-9 Score 0 1 3  Difficult doing work/chores Not difficult at all Not difficult at all Not difficult at all    -------------------------------------------------------------------------------------------------------------    Social History  Tobacco Use  . Smoking status: Never Smoker  . Smokeless tobacco: Never Used  Substance Use Topics  . Alcohol use: Never  . Drug use: Never   Social History   Socioeconomic History  . Marital status: Widowed    Spouse name: Not on file  . Number of children: Not on file  . Years of education: Not on file  . Highest education level: Not on file  Occupational History  . Occupation: retired  Tobacco Use  . Smoking status: Never Smoker  . Smokeless  tobacco: Never Used  Substance and Sexual Activity  . Alcohol use: Never  . Drug use: Never  . Sexual activity: Not Currently  Other Topics Concern  . Not on file  Social History Narrative  . Not on file   Social Determinants of Health   Financial Resource Strain:   . Difficulty of Paying Living Expenses:   Food Insecurity:   . Worried About Charity fundraiser in the Last Year:   . Arboriculturist in the Last Year:   Transportation Needs:   . Film/video editor (Medical):   Marland Kitchen Lack of Transportation (Non-Medical):   Physical Activity:   . Days of Exercise per Week:   . Minutes of Exercise per Session:   Stress:   . Feeling of Stress :   Social Connections:   . Frequency of Communication with Friends and Family:   . Frequency of Social Gatherings with Friends and Family:   . Attends Religious Services:   . Active Member of Clubs or Organizations:   . Attends Archivist Meetings:   Marland Kitchen Marital Status:   Intimate Partner Violence:   . Fear of Current or Ex-Partner:   . Emotionally Abused:   Marland Kitchen Physically Abused:   . Sexually Abused:        Medications: Outpatient Medications Prior to Visit  Medication Sig  . atorvastatin (LIPITOR) 40 MG tablet Take 1 tablet (40 mg total) by mouth daily.  . Calcium Carbonate-Vitamin D (CALCIUM 600+D) 600-200 MG-UNIT TABS Take by mouth.  . metFORMIN (GLUCOPHAGE) 500 MG tablet Take 1 tablet (500 mg total) by mouth 2 (two) times daily with a meal.  . Multiple Vitamin (MULTIVITAMIN) tablet Take 1 tablet by mouth daily.  . niacin 500 MG tablet Take 500 mg by mouth at bedtime.  . sertraline (ZOLOFT) 50 MG tablet Take 1 tablet (50 mg total) by mouth daily.  . [DISCONTINUED] Melatonin 5 MG TABS Take by mouth.   No facility-administered medications prior to visit.    Review of Systems  Constitutional: Negative.   HENT: Negative.   Eyes: Negative.   Respiratory: Negative.   Cardiovascular: Negative.   Gastrointestinal:  Negative.   Endocrine: Negative.   Genitourinary: Negative.   Musculoskeletal: Negative.   Skin: Negative.   Allergic/Immunologic: Negative.   Neurological: Negative.   Hematological: Negative.   Psychiatric/Behavioral: Negative.     Last metabolic panel Lab Results  Component Value Date   GLUCOSE 123 (H) 05/25/2019   NA 138 09/05/2019   K 4.2 09/05/2019   CL 102 09/05/2019   CO2 33 (A) 09/05/2019   BUN 16 09/05/2019   CREATININE 0.6 09/05/2019   GFRNONAA 92 05/25/2019   GFRAA 106 05/25/2019   CALCIUM 9.3 09/05/2019   PROT 6.5 05/25/2019   ALBUMIN 4.1 09/05/2019   LABGLOB 2.2 05/25/2019   AGRATIO 2.0 05/25/2019   BILITOT 0.5 05/25/2019   ALKPHOS 58 09/05/2019   AST 14 09/05/2019  ALT 22 09/05/2019   Last lipids Lab Results  Component Value Date   CHOL 167 05/25/2019   HDL 49 05/25/2019   LDLCALC 108 (H) 05/25/2019   TRIG 51 05/25/2019   CHOLHDL 3.4 05/25/2019   Last hemoglobin A1c Lab Results  Component Value Date   HGBA1C 7.9 09/05/2019      Objective    BP 118/67 (BP Location: Right Arm, Patient Position: Sitting, Cuff Size: Normal)   Pulse 66   Temp (!) 97.1 F (36.2 C) (Temporal)   Resp 16   Ht 5\' 3"  (1.6 m)   Wt 125 lb 3.2 oz (56.8 kg)   BMI 22.18 kg/m  BP Readings from Last 3 Encounters:  11/07/19 118/67  09/14/19 (!) 148/64  08/15/19 129/63   Wt Readings from Last 3 Encounters:  11/07/19 125 lb 3.2 oz (56.8 kg)  09/14/19 130 lb (59 kg)  09/12/19 129 lb 6.4 oz (58.7 kg)      Physical Exam Constitutional:      Appearance: Normal appearance.  HENT:     Head: Normocephalic and atraumatic.     Mouth/Throat:     Mouth: Mucous membranes are moist.     Pharynx: Oropharynx is clear.  Cardiovascular:     Rate and Rhythm: Normal rate and regular rhythm.     Pulses: Normal pulses.     Heart sounds: Normal heart sounds.  Pulmonary:     Effort: Pulmonary effort is normal.     Breath sounds: Normal breath sounds.  Abdominal:     General:  Abdomen is flat. Bowel sounds are normal.     Palpations: Abdomen is soft.  Musculoskeletal:     Cervical back: Neck supple.     Right lower leg: No edema.     Left lower leg: No edema.  Skin:    General: Skin is warm and dry.     Capillary Refill: Capillary refill takes less than 2 seconds.  Neurological:     General: No focal deficit present.     Mental Status: She is alert and oriented to person, place, and time.     Deep Tendon Reflexes: Reflexes normal.  Psychiatric:        Mood and Affect: Mood normal.        Behavior: Behavior normal.      No results found for any visits on 11/07/19.  Assessment & Plan     Problem List Items Addressed This Visit      Endocrine   T2DM (type 2 diabetes mellitus) ([place]) - Primary    Pt with history of diabetes controlled on medication Pt is taking metformin 500mg  Pt is up to date on foot exam, eye exam, microalbumin, vaccinations Pt is taking a statin  Plan: Discussed with pt the importance of low carb diet and exercise for diabetes Will Check A1C in 1 month Will check CMP in 1 month Will check Lipid panel in 1 month Will continue taking metformin as prescirbed      Relevant Orders   HgB A1c   Hyperlipidemia associated with type 2 diabetes mellitus ([place])    Pt with a history of hyperlipidemia that is controlled on medication LDL in November 2020 was 40 Pt was recently switched to atorvastatin Goal is LDL under 70  Plan Will check lipid panel in 1 month Will check CMP in 1 month Will continue taking atorvastatin as prescribed      Relevant Orders   Lipid Profile   Comprehensive Metabolic Panel (CMET)  Other   Memory loss of unknown cause    Pt with history of memory loss that is being followed by neurology  Plan: Continue to follow up with neurology Will continue to monitor symptoms      Adjustment disorder with mixed anxiety and depressed mood    Pt with history of adjustment disorder due to husband  passing Symptoms are controlled on medication Pt is taking zoloft and would like to stay on medication   Plan: Will repeat PHQ9 and GAD7 at next visit Will continue taking zoloft as prescribed          Return in about 4 months (around 03/09/2020) for Annual Wellness Exam.      Chipper Herb, [place] [phone] (phone) [phone] (fax)  [place]

## 2019-11-07 NOTE — Assessment & Plan Note (Signed)
Pt with history of memory loss that is being followed by neurology  Plan: Continue to follow up with neurology Will continue to monitor symptoms

## 2019-11-07 NOTE — Assessment & Plan Note (Addendum)
Pt with a history of hyperlipidemia that is controlled on medication LDL in November 2020 was 55 Pt was recently switched to atorvastatin Goal is LDL under 70  Plan Will check lipid panel in 1 month Will check CMP in 1 month Will continue taking atorvastatin as prescribed

## 2019-11-07 NOTE — Assessment & Plan Note (Addendum)
Pt with history of diabetes controlled on medication Pt is taking metformin 500mg  Pt is up to date on foot exam, eye exam, microalbumin, vaccinations Pt is taking a statin  Plan: Discussed with pt the importance of low carb diet and exercise for diabetes Will Check A1C in 1 month Will check CMP in 1 month Will check Lipid panel in 1 month Will continue taking metformin as prescirbed

## 2019-11-23 DIAGNOSIS — R002 Palpitations: Secondary | ICD-10-CM | POA: Insufficient documentation

## 2019-11-24 ENCOUNTER — Other Ambulatory Visit: Payer: Self-pay | Admitting: Family Medicine

## 2019-11-24 DIAGNOSIS — E785 Hyperlipidemia, unspecified: Secondary | ICD-10-CM

## 2019-11-26 ENCOUNTER — Other Ambulatory Visit: Payer: Self-pay | Admitting: Family Medicine

## 2019-11-26 DIAGNOSIS — E1169 Type 2 diabetes mellitus with other specified complication: Secondary | ICD-10-CM

## 2019-11-26 NOTE — Telephone Encounter (Signed)
Requested Prescriptions  Pending Prescriptions Disp Refills  . atorvastatin (LIPITOR) 40 MG tablet [Pharmacy Med Name: Atorvastatin Calcium 40 MG Oral Tablet] 90 tablet 1    Sig: Take 1 tablet by mouth once daily     Cardiovascular:  Antilipid - Statins Failed - 11/26/2019  1:23 PM      Failed - LDL in normal range and within 360 days    LDL Chol Calc (NIH)  Date Value Ref Range Status  05/25/2019 108 (H) 0 - 99 mg/dL Final         Passed - Total Cholesterol in normal range and within 360 days    Cholesterol, Total  Date Value Ref Range Status  05/25/2019 167 100 - 199 mg/dL Final         Passed - HDL in normal range and within 360 days    HDL  Date Value Ref Range Status  05/25/2019 49 >39 mg/dL Final         Passed - Triglycerides in normal range and within 360 days    Triglycerides  Date Value Ref Range Status  05/25/2019 51 0 - 149 mg/dL Final         Passed - Patient is not pregnant      Passed - Valid encounter within last 12 months    Recent Outpatient Visits          2 weeks ago Type 2 diabetes mellitus with hyperglycemia, without long-term current use of insulin [facility])   [facility] [place], Dionne Bucy, MD   2 months ago Type 2 diabetes mellitus with hyperglycemia, without long-term current use of insulin [facility])   [facility], Dionne Bucy, MD   3 months ago Memory loss   [facility], Dionne Bucy, MD   6 months ago Controlled type 2 diabetes mellitus without complication, without long-term current use of insulin [facility])   [facility], Dionne Bucy, MD

## 2019-12-05 ENCOUNTER — Other Ambulatory Visit: Payer: Self-pay | Admitting: Family Medicine

## 2019-12-06 ENCOUNTER — Telehealth: Payer: Self-pay | Admitting: Family Medicine

## 2019-12-06 NOTE — Telephone Encounter (Signed)
Error

## 2019-12-13 ENCOUNTER — Ambulatory Visit: Payer: Self-pay | Admitting: Family Medicine

## 2019-12-27 ENCOUNTER — Telehealth: Payer: Self-pay | Admitting: Family Medicine

## 2019-12-27 NOTE — Telephone Encounter (Signed)
Medication Refill - Medication: Test strips   Pt is testing more than once a day.  Has the patient contacted their pharmacy? Yes.   (Agent: If no, request that the patient contact the pharmacy for the refill.) (Agent: If yes, when and what did the pharmacy advise?)  Preferred Pharmacy (with phone number or street name):  [facility] [place] - [place] ([place]) [place] [address]  Phone: [phone] Fax: [phone]     Agent: Please be advised that RX refills may take up to 3 business days. We ask that you follow-up with your pharmacy.

## 2019-12-27 NOTE — Telephone Encounter (Signed)
Tried calling; pt's voicemail is not set up.  We need to know what brand test strips need to filled.  PEC please advise when pt calls back.    Thanks,   -Mickel Baas

## 2020-01-01 ENCOUNTER — Other Ambulatory Visit: Payer: Self-pay | Admitting: Family Medicine

## 2020-01-01 NOTE — Telephone Encounter (Signed)
Requested Prescriptions  Pending Prescriptions Disp Refills   metFORMIN (GLUCOPHAGE) 500 MG tablet [Pharmacy Med Name: metFORMIN HCl 500 MG Oral Tablet] 180 tablet 1    Sig: TAKE 1 TABLET BY MOUTH TWICE DAILY WITH A MEAL     Endocrinology:  Diabetes - Biguanides Passed - 01/01/2020  3:13 PM      Passed - Cr in normal range and within 360 days    Creatinine  Date Value Ref Range Status  09/05/2019 0.6 0.5 - 1.1 Final   Creatinine, Ser  Date Value Ref Range Status  05/25/2019 0.59 0.57 - 1.00 mg/dL Final         Passed - HBA1C is between 0 and 7.9 and within 180 days    Hemoglobin A1C  Date Value Ref Range Status  09/05/2019 7.9  Final         Passed - eGFR in normal range and within 360 days    GFR calc Af Amer  Date Value Ref Range Status  05/25/2019 106 >59 mL/min/1.73 Final   GFR calc non Af Amer  Date Value Ref Range Status  05/25/2019 92 >59 mL/min/1.73 Final         Passed - Valid encounter within last 6 months    Recent Outpatient Visits          1 month ago Type 2 diabetes mellitus with hyperglycemia, without long-term current use of insulin ([place])   [facility] [place], Dionne Bucy, MD   3 months ago Type 2 diabetes mellitus with hyperglycemia, without long-term current use of insulin [facility])   [facility], Dionne Bucy, MD   4 months ago Memory loss   [facility], Dionne Bucy, MD   7 months ago Controlled type 2 diabetes mellitus without complication, without long-term current use of insulin [facility])   [facility], Dionne Bucy, MD

## 2020-01-02 MED ORDER — PRECISION QID TEST VI STRP: 1.0000 | ORAL_STRIP | Freq: Every day | 3 refills | Status: DC

## 2020-01-03 ENCOUNTER — Other Ambulatory Visit: Payer: Self-pay | Admitting: Family Medicine

## 2020-01-03 DIAGNOSIS — E1165 Type 2 diabetes mellitus with hyperglycemia: Secondary | ICD-10-CM

## 2020-01-03 NOTE — Telephone Encounter (Signed)
I spoke with Terry Watson's daughter in law and she is not sure which meter the patient has.  She is going to check on it and then call us back.

## 2020-01-03 NOTE — Telephone Encounter (Signed)
Medication Refill - Medication: Test strips (Patients family member stated that pharmacy has been trying to reach out to office multiple times in regards to patients test strips being sent over. Patient is completely out of test strips and family would like a callback once medication has been sent over to pharmacy.  Has the patient contacted their pharmacy? yes (Agent: If no, request that the patient contact the pharmacy for the refill.) (Agent: If yes, when and what did the pharmacy advise?)contact pcp  Preferred Pharmacy (with phone number or street name):  [facility] [place] Phone:  [phone]  Fax:  [phone]       Agent: Please be advised that RX refills may take up to 3 business days. We ask that you follow-up with your pharmacy.

## 2020-01-03 NOTE — Telephone Encounter (Signed)
Called pharmacy [phone]) and spoke to [place]. Dominica Severin stated pt is using Accu check  guide test strips. Called pt and LM on VM to call back to verify. Routing to office.

## 2020-01-04 ENCOUNTER — Other Ambulatory Visit: Payer: Self-pay

## 2020-01-04 ENCOUNTER — Telehealth: Payer: Self-pay

## 2020-01-04 DIAGNOSIS — E1165 Type 2 diabetes mellitus with hyperglycemia: Secondary | ICD-10-CM

## 2020-01-04 MED ORDER — ACCU-CHEK GUIDE VI STRP: ORAL_STRIP | 12 refills | Status: DC

## 2020-01-04 MED ORDER — ACCU-CHEK AVIVA PLUS VI STRP: ORAL_STRIP | 12 refills | Status: DC

## 2020-01-04 NOTE — Telephone Encounter (Signed)
Copied from [place] [phone]. Topic: General - Other >> Jan 03, 2020  4:37 PM Wynetta Emery, [place] C wrote: Reason for CRM: pt's daughter called in to provide the name of the test meter to provider:   [employer] Guide Me Meter.

## 2020-01-04 NOTE — Telephone Encounter (Signed)
Patient's family is calling again to get the test strips sent to pharmacy.  Patient is all out and needs this filled as soon as possible.  Please advise.

## 2020-01-04 NOTE — Addendum Note (Signed)
Addended by: Shawna Orleans on: 01/04/2020 11:56 AM   Modules accepted: Orders

## 2020-03-27 ENCOUNTER — Encounter: Payer: Self-pay | Admitting: Family Medicine

## 2020-03-27 ENCOUNTER — Other Ambulatory Visit: Payer: Self-pay

## 2020-03-27 ENCOUNTER — Ambulatory Visit (INDEPENDENT_AMBULATORY_CARE_PROVIDER_SITE_OTHER): Payer: Medicare Other | Admitting: Family Medicine

## 2020-03-27 VITALS — BP 124/54 | HR 55 | Temp 98.1°F | Wt 121.0 lb

## 2020-03-27 DIAGNOSIS — F4323 Adjustment disorder with mixed anxiety and depressed mood: Secondary | ICD-10-CM | POA: Diagnosis not present

## 2020-03-27 DIAGNOSIS — Z23 Encounter for immunization: Secondary | ICD-10-CM

## 2020-03-27 DIAGNOSIS — E1169 Type 2 diabetes mellitus with other specified complication: Secondary | ICD-10-CM

## 2020-03-27 DIAGNOSIS — Z0000 Encounter for general adult medical examination without abnormal findings: Secondary | ICD-10-CM

## 2020-03-27 DIAGNOSIS — Z1231 Encounter for screening mammogram for malignant neoplasm of breast: Secondary | ICD-10-CM | POA: Diagnosis not present

## 2020-03-27 DIAGNOSIS — R413 Other amnesia: Secondary | ICD-10-CM

## 2020-03-27 DIAGNOSIS — E785 Hyperlipidemia, unspecified: Secondary | ICD-10-CM
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
## 2020-03-27 NOTE — Assessment & Plan Note (Signed)
Seems exacerbated today, but this may be related to her recent loss and grief as above Continue to follow-up with neurology

## 2020-03-27 NOTE — Assessment & Plan Note (Signed)
Recent worsening due to her son dying by suicide She still feels fairly well controlled and wants to continue Zoloft at current dose

## 2020-03-27 NOTE — Assessment & Plan Note (Signed)
Followed by endocrinology Well-controlled on last A1c We will abstract A1c and urine microalbumin Up-to-date on eye exam and vaccinations Foot exam completed today Continue statin No changes to medications

## 2020-03-27 NOTE — Patient Instructions (Signed)
Preventive Care 38 Years and Older, Female Preventive care refers to lifestyle choices and visits with your health care provider that can promote health and wellness. This includes:  A yearly physical exam. This is also called an annual well check.  Regular dental and eye exams.  Immunizations.  Screening for certain conditions.  Healthy lifestyle choices, such as diet and exercise. What can I expect for my preventive care visit? Physical exam Your health care provider will check:  Height and weight. These may be used to calculate body mass index (BMI), which is a measurement that tells if you are at a healthy weight.  Heart rate and blood pressure.  Your skin for abnormal spots. Counseling Your health care provider may ask you questions about:  Alcohol, tobacco, and drug use.  Emotional well-being.  Home and relationship well-being.  Sexual activity.  Eating habits.  History of falls.  Memory and ability to understand (cognition).  Work and work Statistician.  Pregnancy and menstrual history. What immunizations do I need?  Influenza (flu) vaccine  This is recommended every year. Tetanus, diphtheria, and pertussis (Tdap) vaccine  You may need a Td booster every 10 years. Varicella (chickenpox) vaccine  You may need this vaccine if you have not already been vaccinated. Zoster (shingles) vaccine  You may need this after age 33. Pneumococcal conjugate (PCV13) vaccine  One dose is recommended after age 33. Pneumococcal polysaccharide (PPSV23) vaccine  One dose is recommended after age 72. Measles, mumps, and rubella (MMR) vaccine  You may need at least one dose of MMR if you were born in 1957 or later. You may also need a second dose. Meningococcal conjugate (MenACWY) vaccine  You may need this if you have certain conditions. Hepatitis A vaccine  You may need this if you have certain conditions or if you travel or work in places where you may be exposed  to hepatitis A. Hepatitis B vaccine  You may need this if you have certain conditions or if you travel or work in places where you may be exposed to hepatitis B. Haemophilus influenzae type b (Hib) vaccine  You may need this if you have certain conditions. You may receive vaccines as individual doses or as more than one vaccine together in one shot (combination vaccines). Talk with your health care provider about the risks and benefits of combination vaccines. What tests do I need? Blood tests  Lipid and cholesterol levels. These may be checked every 5 years, or more frequently depending on your overall health.  Hepatitis C test.  Hepatitis B test. Screening  Lung cancer screening. You may have this screening every year starting at age 39 if you have a 30-pack-year history of smoking and currently smoke or have quit within the past 15 years.  Colorectal cancer screening. All adults should have this screening starting at age 36 and continuing until age 15. Your health care provider may recommend screening at age 23 if you are at increased risk. You will have tests every 1-10 years, depending on your results and the type of screening test.  Diabetes screening. This is done by checking your blood sugar (glucose) after you have not eaten for a while (fasting). You may have this done every 1-3 years.  Mammogram. This may be done every 1-2 years. Talk with your health care provider about how often you should have regular mammograms.  BRCA-related cancer screening. This may be done if you have a family history of breast, ovarian, tubal, or peritoneal cancers.  Other tests  Sexually transmitted disease (STD) testing.  Bone density scan. This is done to screen for osteoporosis. You may have this done starting at age 44. Follow these instructions at home: Eating and drinking  Eat a diet that includes fresh fruits and vegetables, whole grains, lean protein, and low-fat dairy products. Limit  your intake of foods with high amounts of sugar, saturated fats, and salt.  Take vitamin and mineral supplements as recommended by your health care provider.  Do not drink alcohol if your health care provider tells you not to drink.  If you drink alcohol: ? Limit how much you have to 0-1 drink a day. ? Be aware of how much alcohol is in your drink. In the [place] one drink equals one 12 oz bottle of beer (355 mL), one 5 oz glass of wine (148 mL), or one 1 oz glass of hard liquor (44 mL). Lifestyle  Take daily care of your teeth and gums.  Stay active. Exercise for at least 30 minutes on 5 or more days each week.  Do not use any products that contain nicotine or tobacco, such as cigarettes, e-cigarettes, and chewing tobacco. If you need help quitting, ask your health care provider.  If you are sexually active, practice safe sex. Use a condom or other form of protection in order to prevent STIs (sexually transmitted infections).  Talk with your health care provider about taking a low-dose aspirin or statin. What's next?  Go to your health care provider once a year for a well check visit.  Ask your health care provider how often you should have your eyes and teeth checked.  Stay up to date on all vaccines. This information is not intended to replace advice given to you by your health care provider. Make sure you discuss any questions you have with your health care provider. Document Revised: 06/17/2018 Document Reviewed: 06/17/2018 Elsevier Patient Education  2020 [employer].

## 2020-03-27 NOTE — Assessment & Plan Note (Signed)
Previously well controlled Goal LDL <70 in setting of T2DM Continue atorvastatin Recheck FLP and CMP

## 2020-03-28 LAB — COMPREHENSIVE METABOLIC PANEL
ALT: 15 IU/L (ref 0–32)
AST: 14 IU/L (ref 0–40)
Albumin/Globulin Ratio: 2 (ref 1.2–2.2)
Albumin: 4.2 g/dL (ref 3.7–4.7)
Alkaline Phosphatase: 56 IU/L (ref 44–121)
BUN/Creatinine Ratio: 25 (ref 12–28)
BUN: 15 mg/dL (ref 8–27)
Bilirubin Total: 0.4 mg/dL (ref 0.0–1.2)
CO2: 25 mmol/L (ref 20–29)
Calcium: 9.8 mg/dL (ref 8.7–10.3)
Chloride: 102 mmol/L (ref 96–106)
Creatinine, Ser: 0.59 mg/dL (ref 0.57–1.00)
GFR calc Af Amer: 105 mL/min/{1.73_m2} (ref >59)
GFR calc non Af Amer: 91 mL/min/{1.73_m2} (ref >59)
Globulin, Total: 2.1 g/dL (ref 1.5–4.5)
Glucose: 100 mg/dL — ABNORMAL HIGH (ref 65–99)
Potassium: 4.4 mmol/L (ref 3.5–5.2)
Sodium: 140 mmol/L (ref 134–144)
Total Protein: 6.3 g/dL (ref 6.0–8.5)

## 2020-03-28 LAB — LIPID PANEL
Chol/HDL Ratio: 3.3 ratio (ref 0.0–4.4)
Cholesterol, Total: 118 mg/dL (ref 100–199)
HDL: 36 mg/dL — ABNORMAL LOW (ref >39)
LDL Chol Calc (NIH): 68 mg/dL (ref 0–99)
Triglycerides: 66 mg/dL (ref 0–149)
VLDL Cholesterol Cal: 14 mg/dL (ref 5–40)

## 2020-03-29 ENCOUNTER — Telehealth: Payer: Self-pay

## 2020-03-29 NOTE — Telephone Encounter (Signed)
-----   Message from Virginia Crews, MD sent at 03/28/2020  1:27 PM EDT ----- Normal labs

## 2020-03-29 NOTE — Telephone Encounter (Signed)
Visible to patient:  Yes (seen)

## 2020-04-16 ENCOUNTER — Other Ambulatory Visit: Payer: Self-pay | Admitting: Family Medicine

## 2020-04-16 ENCOUNTER — Ambulatory Visit: Payer: Medicare Other | Attending: Internal Medicine

## 2020-04-16 DIAGNOSIS — Z23 Encounter for immunization: Secondary | ICD-10-CM

## 2020-04-16 DIAGNOSIS — E1169 Type 2 diabetes mellitus with other specified complication: Secondary | ICD-10-CM

## 2020-04-16 NOTE — Telephone Encounter (Signed)
Requested Prescriptions  Pending Prescriptions Disp Refills   atorvastatin (LIPITOR) 40 MG tablet [Pharmacy Med Name: Atorvastatin Calcium 40 MG Oral Tablet] 90 tablet 1    Sig: Take 1 tablet by mouth once daily     Cardiovascular:  Antilipid - Statins Failed - 04/16/2020  3:18 PM      Failed - LDL in normal range and within 360 days    LDL Chol Calc (NIH)  Date Value Ref Range Status  03/27/2020 68 0 - 99 mg/dL Final         Failed - HDL in normal range and within 360 days    HDL  Date Value Ref Range Status  03/27/2020 36 (L) >39 mg/dL Final         Passed - Total Cholesterol in normal range and within 360 days    Cholesterol, Total  Date Value Ref Range Status  03/27/2020 118 100 - 199 mg/dL Final         Passed - Triglycerides in normal range and within 360 days    Triglycerides  Date Value Ref Range Status  03/27/2020 66 0 - 149 mg/dL Final         Passed - Patient is not pregnant      Passed - Valid encounter within last 12 months    Recent Outpatient Visits          2 weeks ago Encounter for annual wellness visit (AWV) in Medicare patient   [facility], Dionne Bucy, MD   5 months ago Type 2 diabetes mellitus with hyperglycemia, without long-term current use of insulin [facility])   [place], Dionne Bucy, MD   7 months ago Type 2 diabetes mellitus with hyperglycemia, without long-term current use of insulin [facility])   [facility], Dionne Bucy, MD   8 months ago Memory loss   [facility], Dionne Bucy, MD   11 months ago Controlled type 2 diabetes mellitus without complication, without long-term current use of insulin [facility])   [facility], Dionne Bucy, MD

## 2020-04-16 NOTE — Progress Notes (Signed)
   Covid-19 Vaccination Clinic  Name:  Terry Watson    MRN: [id number] DOB: [birth year]  04/16/2020  Terry Watson was observed post Covid-19 immunization for 15 minutes without incident. She was provided with Vaccine Information Sheet and instruction to access the V-Safe system.   Terry Watson was instructed to call 911 with any severe reactions post vaccine: Marland Kitchen Difficulty breathing  . Swelling of face and throat  . A fast heartbeat  . A bad rash all over body  . Dizziness and weakness

## 2020-05-18 ENCOUNTER — Telehealth: Payer: Self-pay | Admitting: Family Medicine

## 2020-05-18 NOTE — Telephone Encounter (Signed)
Terry Watson calling from [place] is calling to request verbal orders for OT 1 week 3 Ok to verbal on VM Cb- [phone]

## 2020-05-18 NOTE — Telephone Encounter (Signed)
Copied from [place] [id number]. Topic: Quick Communication - Home Health Verbal Orders >> May 18, 2020  2:36 PM Yvette Rack wrote: Caller/Agency: Langley Gauss with [facility] Number: [phone] Option# 2 Requesting OT/PT/Skilled Nursing/Social Work/Speech Therapy: Pt family requests palliative care services. Denise requests call for approval

## 2020-05-21 NOTE — Telephone Encounter (Signed)
L/M advising below.  

## 2020-05-21 NOTE — Telephone Encounter (Signed)
Ok for verbals 

## 2020-06-04 ENCOUNTER — Other Ambulatory Visit: Payer: Medicare Other | Admitting: Nurse Practitioner

## 2020-06-04 ENCOUNTER — Telehealth: Payer: Self-pay | Admitting: Nurse Practitioner

## 2020-06-04 ENCOUNTER — Encounter: Payer: Self-pay | Admitting: Nurse Practitioner

## 2020-06-04 ENCOUNTER — Other Ambulatory Visit: Payer: Self-pay

## 2020-06-04 DIAGNOSIS — I639 Cerebral infarction, unspecified: Secondary | ICD-10-CM

## 2020-06-04 DIAGNOSIS — Z515 Encounter for palliative care: Secondary | ICD-10-CM

## 2020-06-04 NOTE — Telephone Encounter (Signed)
Spoke with patient's daughter-in-law, Delisa Finck and have scheduled a Teleheath Palliative Consult for 06/04/20 @ 1:30 PM.
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
## 2020-06-05 ENCOUNTER — Other Ambulatory Visit: Payer: Self-pay | Admitting: Family Medicine

## 2020-06-05 MED ORDER — SERTRALINE HCL 50 MG PO TABS: 50.0000 mg | ORAL_TABLET | Freq: Every day | ORAL | 1 refills | Status: DC

## 2020-06-05 NOTE — Telephone Encounter (Signed)
Requesting sertraline (ZOLOFT) 50 MG tablet, contacted the pharmacy last week following up on the status. Informed patient please allow 48 to 72 hour turn around time   Pharmacy preference  [facility] [place] [place] Phone:  [phone]  Fax:  [phone]

## 2020-06-21 ENCOUNTER — Telehealth: Payer: Self-pay | Admitting: Nurse Practitioner

## 2020-06-21 NOTE — Telephone Encounter (Signed)
Rec'd call back from patient's daughter-in-law [name] and they have declined any further Palliative visits at this time.  I will discharge patient and notify MD and Palliative Team.

## 2020-06-21 NOTE — Telephone Encounter (Signed)
Called patient's daughter-in-law, Alianna Wurster, at the request of the Palliative NP to offer to schedule a f/u visit with patient - no answer, left message requesting a return call.  Left my name and contact information.

## 2020-06-28 ENCOUNTER — Other Ambulatory Visit: Payer: Self-pay | Admitting: Family Medicine

## 2020-08-03 ENCOUNTER — Telehealth: Payer: Self-pay

## 2020-08-03 NOTE — Telephone Encounter (Signed)
LMTCB

## 2020-08-03 NOTE — Telephone Encounter (Signed)
Agree with this plan.  If she develops dysuria, hematuria, fever, abd pain, would get UA [facility] RN should be able to take order and send sample for UA and culture). If able to collect at home, could do virtual visit instead of in person.

## 2020-08-03 NOTE — Telephone Encounter (Signed)
Patty advised as below. She reports patient is more likely dehydrated due to drinking too much coffee.

## 2020-08-03 NOTE — Telephone Encounter (Signed)
Copied from [place] [id number]. Topic: General - Other >> Aug 03, 2020  9:54 AM Leward Quan A wrote: Reason for CRM: Aris Everts patient daughter in law called in to inquire of Dr B about if they were to do an at home UTI test would it be accepted and if patient does have a UTI can something be prescribed. Was trying to get her an appointment to come in this morning since the nurse is with her until 12 PM. But nothing was available till 1.40 PM and no one available to bring her in. Please advise call Ph# [phone]

## 2020-08-03 NOTE — Telephone Encounter (Signed)
I spoke with the daughter in law Patty, she states that the home nurse for the patient said her urine was dark this morning and had a smell to it.  [name] thinks this is from the patient drinking numerous cups of coffee a day.  Possibly more than 6.  Patient has not had any complaints.  Transportation is an issue for the patient.  [name] is going to get patient to increase fluid intake of water and see how she does.  She has made her an appointment for next week in the event she does develop symptoms.  Will cancel if needed.

## 2020-08-08 ENCOUNTER — Ambulatory Visit: Payer: Self-pay | Admitting: Physician Assistant

## 2020-08-08 ENCOUNTER — Encounter (INDEPENDENT_AMBULATORY_CARE_PROVIDER_SITE_OTHER): Payer: Medicare Other | Admitting: Ophthalmology

## 2020-09-05 ENCOUNTER — Telehealth: Payer: Self-pay | Admitting: Family Medicine

## 2020-09-05 MED ORDER — METFORMIN HCL 500 MG PO TABS: 500.0000 mg | ORAL_TABLET | Freq: Two times a day (BID) | ORAL | 2 refills | Status: DC

## 2020-09-05 NOTE — Telephone Encounter (Signed)
[facility]) Pharmacy faxed refill request for the following medications:  metFORMIN (GLUCOPHAGE) 500 MG tablet  Last Rx: 06/28/20 LOV: 03/27/20 NOV: 03/28/21 Please advise. Thanks TNP

## 2020-11-22 ENCOUNTER — Other Ambulatory Visit: Payer: Self-pay | Admitting: Family Medicine

## 2020-11-22 DIAGNOSIS — E1169 Type 2 diabetes mellitus with other specified complication: Secondary | ICD-10-CM

## 2020-11-22 DIAGNOSIS — E785 Hyperlipidemia, unspecified: Secondary | ICD-10-CM

## 2020-11-22 NOTE — Telephone Encounter (Signed)
Requested Prescriptions  Pending Prescriptions Disp Refills  . atorvastatin (LIPITOR) 40 MG tablet [Pharmacy Med Name: Atorvastatin Calcium 40 MG Oral Tablet] 90 tablet 1    Sig: Take 1 tablet by mouth once daily     Cardiovascular:  Antilipid - Statins Failed - 11/22/2020  4:10 PM      Failed - HDL in normal range and within 360 days    HDL  Date Value Ref Range Status  03/27/2020 36 (L) >39 mg/dL Final         Failed - Valid encounter within last 12 months    Recent Outpatient Visits          8 months ago Encounter for annual wellness visit (AWV) in Medicare patient   [facility], Dionne Bucy, MD   1 year ago Type 2 diabetes mellitus with hyperglycemia, without long-term current use of insulin [facility])   [facility] [place], Dionne Bucy, MD   1 year ago Type 2 diabetes mellitus with hyperglycemia, without long-term current use of insulin [facility])   [place], Dionne Bucy, MD   1 year ago Memory loss   [place], Dionne Bucy, MD   1 year ago Controlled type 2 diabetes mellitus without complication, without long-term current use of insulin [facility])   [facility], Dionne Bucy, MD             Passed - Total Cholesterol in normal range and within 360 days    Cholesterol, Total  Date Value Ref Range Status  03/27/2020 118 100 - 199 mg/dL Final         Passed - LDL in normal range and within 360 days    LDL Chol Calc (NIH)  Date Value Ref Range Status  03/27/2020 68 0 - 99 mg/dL Final         Passed - Triglycerides in normal range and within 360 days    Triglycerides  Date Value Ref Range Status  03/27/2020 66 0 - 149 mg/dL Final         Passed - Patient is not pregnant

## 2020-11-29 ENCOUNTER — Other Ambulatory Visit: Payer: Self-pay | Admitting: Family Medicine

## 2021-03-28 ENCOUNTER — Ambulatory Visit: Payer: Medicare Other | Admitting: Family Medicine

## 2021-03-29 ENCOUNTER — Other Ambulatory Visit: Payer: Self-pay | Admitting: Family Medicine

## 2021-03-29 DIAGNOSIS — E1165 Type 2 diabetes mellitus with hyperglycemia: Secondary | ICD-10-CM

## 2021-05-21 LAB — HM DIABETES EYE EXAM

## 2021-05-24 ENCOUNTER — Other Ambulatory Visit: Payer: Self-pay

## 2021-05-24 DIAGNOSIS — E1165 Type 2 diabetes mellitus with hyperglycemia: Secondary | ICD-10-CM

## 2021-05-24 MED ORDER — ACCU-CHEK GUIDE VI STRP: ORAL_STRIP | 0 refills | Status: DC

## 2021-05-26 ENCOUNTER — Other Ambulatory Visit: Payer: Self-pay | Admitting: Family Medicine

## 2021-05-26 DIAGNOSIS — E785 Hyperlipidemia, unspecified: Secondary | ICD-10-CM

## 2021-05-26 DIAGNOSIS — E1169 Type 2 diabetes mellitus with other specified complication: Secondary | ICD-10-CM

## 2021-05-26 NOTE — Telephone Encounter (Signed)
Requested medication (s) are due for refill today: yes to both medications  Requested medication (s) are on the active medication list: yes to both  Last refill:  Atorvastatin: 11/22/20 #90 1 RF    Sertraline: 11/29/20 #90 1 RF  Future visit scheduled: yes  Notes to clinic:  overdue lab work   Requested Prescriptions  Pending Prescriptions Disp Refills   atorvastatin (LIPITOR) 40 MG tablet [Pharmacy Med Name: Atorvastatin Calcium 40 MG Oral Tablet] 90 tablet 0    Sig: Take 1 tablet by mouth once daily     Cardiovascular:  Antilipid - Statins Failed - 05/26/2021  6:30 AM      Failed - Total Cholesterol in normal range and within 360 days    Cholesterol, Total  Date Value Ref Range Status  03/27/2020 118 100 - 199 mg/dL Final          Failed - LDL in normal range and within 360 days    LDL Chol Calc (NIH)  Date Value Ref Range Status  03/27/2020 68 0 - 99 mg/dL Final          Failed - HDL in normal range and within 360 days    HDL  Date Value Ref Range Status  03/27/2020 36 (L) >39 mg/dL Final          Failed - Triglycerides in normal range and within 360 days    Triglycerides  Date Value Ref Range Status  03/27/2020 66 0 - 149 mg/dL Final          Failed - Valid encounter within last 12 months    Recent Outpatient Visits           1 year ago Encounter for annual wellness visit (AWV) in Medicare patient   [facility], Dionne Bucy, MD   1 year ago Type 2 diabetes mellitus with hyperglycemia, without long-term current use of insulin ([place])   [place], Dionne Bucy, MD   1 year ago Type 2 diabetes mellitus with hyperglycemia, without long-term current use of insulin [facility])   [facility], Dionne Bucy, MD   1 year ago Memory loss   [place], Dionne Bucy, MD   2 years ago Controlled type 2 diabetes mellitus without complication, without long-term current use of insulin [facility])    [facility], Dionne Bucy, MD       Future Appointments             In 2 weeks Gwyn[facility] Sprout, [place], [place] - Patient is not pregnant       sertraline (ZOLOFT) 50 MG tablet [Pharmacy Med Name: Sertraline HCl 50 MG Oral Tablet] 90 tablet 0    Sig: Take 1 tablet by mouth once daily     Psychiatry:  Antidepressants - SSRI Failed - 05/26/2021  6:30 AM      Failed - Valid encounter within last 6 months    Recent Outpatient Visits           1 year ago Encounter for annual wellness visit (AWV) in Medicare patient   [facility], Dionne Bucy, MD   1 year ago Type 2 diabetes mellitus with hyperglycemia, without long-term current use of insulin [facility])   [place], Dionne Bucy, MD   1 year ago Type 2 diabetes mellitus with hyperglycemia, without long-term current use of  insulin [facility])   [facility], Dionne Bucy, MD   1 year ago Memory loss   [place], Dionne Bucy, MD   2 years ago Controlled type 2 diabetes mellitus without complication, without long-term current use of insulin [facility])   [facility], Dionne Bucy, MD       Future Appointments             In 2 weeks G[facility] Sprout, [place], [facility]

## 2021-06-13 ENCOUNTER — Encounter: Payer: Self-pay | Admitting: Family Medicine

## 2021-06-13 ENCOUNTER — Other Ambulatory Visit: Payer: Self-pay

## 2021-06-13 ENCOUNTER — Telehealth: Payer: Self-pay

## 2021-06-13 ENCOUNTER — Ambulatory Visit (INDEPENDENT_AMBULATORY_CARE_PROVIDER_SITE_OTHER): Payer: Medicare Other | Admitting: Family Medicine

## 2021-06-13 VITALS — BP 120/59 | HR 64 | Resp 15 | Wt 112.0 lb

## 2021-06-13 DIAGNOSIS — E1169 Type 2 diabetes mellitus with other specified complication: Secondary | ICD-10-CM

## 2021-06-13 DIAGNOSIS — E785 Hyperlipidemia, unspecified: Secondary | ICD-10-CM

## 2021-06-13 DIAGNOSIS — Z1231 Encounter for screening mammogram for malignant neoplasm of breast: Secondary | ICD-10-CM

## 2021-06-13 DIAGNOSIS — E1165 Type 2 diabetes mellitus with hyperglycemia: Secondary | ICD-10-CM

## 2021-06-13 DIAGNOSIS — W19XXXS Unspecified fall, sequela: Secondary | ICD-10-CM

## 2021-06-13 DIAGNOSIS — H409 Unspecified glaucoma: Secondary | ICD-10-CM

## 2021-06-13 DIAGNOSIS — R634 Abnormal weight loss: Secondary | ICD-10-CM

## 2021-06-13 DIAGNOSIS — S61412S Laceration without foreign body of left hand, sequela: Secondary | ICD-10-CM

## 2021-06-13 DIAGNOSIS — F4323 Adjustment disorder with mixed anxiety and depressed mood: Secondary | ICD-10-CM | POA: Diagnosis not present

## 2021-06-13 DIAGNOSIS — W19XXXA Unspecified fall, initial encounter: Secondary | ICD-10-CM | POA: Insufficient documentation

## 2021-06-13 LAB — POCT GLYCOSYLATED HEMOGLOBIN (HGB A1C): Hemoglobin A1C: 6.3 % — AB (ref 4.0–5.6)

## 2021-06-13 MED ORDER — SERTRALINE HCL 50 MG PO TABS: 50.0000 mg | ORAL_TABLET | Freq: Every day | ORAL | 3 refills | Status: AC

## 2021-06-13 MED ORDER — NIACIN ER 500 MG PO CPCR: 500.0000 mg | ORAL_CAPSULE | Freq: Every day | ORAL | 3 refills | Status: AC

## 2021-06-13 MED ORDER — ACCU-CHEK GUIDE VI STRP: ORAL_STRIP | 3 refills | Status: DC

## 2021-06-13 MED ORDER — METFORMIN HCL 500 MG PO TABS: 500.0000 mg | ORAL_TABLET | Freq: Two times a day (BID) | ORAL | 3 refills | Status: AC

## 2021-06-13 MED ORDER — MIRTAZAPINE 30 MG PO TBDP: 30.0000 mg | ORAL_TABLET | Freq: Every day | ORAL | 1 refills | Status: DC

## 2021-06-13 MED ORDER — ATORVASTATIN CALCIUM 40 MG PO TABS: 40.0000 mg | ORAL_TABLET | Freq: Every day | ORAL | 3 refills | Status: AC

## 2021-06-13 MED ORDER — LATANOPROST 0.005 % OP SOLN: 1.0000 [drp] | Freq: Every day | OPHTHALMIC | 6 refills | Status: AC

## 2021-06-13 NOTE — Assessment & Plan Note (Signed)
Recent fall Skin tear to L dorsal hand

## 2021-06-13 NOTE — Telephone Encounter (Signed)
Copied from [place] [id number]. Topic: General - Inquiry >> Jun 13, 2021 11:29 AM Greggory Keen D wrote: Pt's  DIL called upset because her mother is a dementia patient and her nurse came with her to the appt.  She said the provider did not listen to the nurse when she tried to talk to her about the patients health. The DIL is upset because they prescribed her mother in law an appetite  enhancer.  She said the mother eats very well she is just slowing losing weight.  They were not satisfied with the visit. And would like someone to call them back  CB#  [phone]

## 2021-06-13 NOTE — Assessment & Plan Note (Signed)
S/P fall in garage visiting family Bandage in place Healing nicely CTM as needed

## 2021-06-13 NOTE — Assessment & Plan Note (Signed)
Recommend low fat diet Pt and CNA report daily exercise, walking Repeat lipid panel

## 2021-06-13 NOTE — Assessment & Plan Note (Signed)
~  10# in 1 year Borderline underweight CNA present for 2 meals a day AMS d/t cognitive process Recent falls- discussed with CNA concerns for bone breaks without some cushion on bone Patient reports walking and does not feel that she can eat 'any more' CNA reports active diet and that she is eating well with CNA supervision Trial of remeron

## 2021-06-13 NOTE — Assessment & Plan Note (Signed)
Refill of Zoloft; Start Remeron to assist with weight gain

## 2021-06-13 NOTE — Assessment & Plan Note (Signed)
Refill of eye gtts; recent eye appt Pt aware that she uses medication at [facility] Denies concerns

## 2021-06-13 NOTE — Progress Notes (Signed)
Established patient visit   Patient: Terry Watson   DOB: 11/27/1945   75 y.o. Female  MRN: [id number] Visit Date: 06/13/2021  Today's healthcare provider: Gwyneth Sprout, FNP   Chief Complaint  Patient presents with   Diabetes   Hyperlipidemia   Depression   Subjective    HPI  Diabetes Mellitus Type II, Follow-up  Lab Results  Component Value Date   HGBA1C 6.3 (A) 06/13/2021   HGBA1C 7.9 09/05/2019   HGBA1C 6.4 (H) 05/25/2019   Wt Readings from Last 3 Encounters:  06/13/21 112 lb (50.8 kg)  03/27/20 121 lb (54.9 kg)  11/07/19 125 lb 3.2 oz (56.8 kg)   Last seen for diabetes 7 months ago.  Management since then includes none. She reports excellent compliance with treatment. She is not having side effects.  Symptoms: No fatigue No foot ulcerations  No appetite changes No nausea  No paresthesia of the feet  No polydipsia  No polyuria No visual disturbances   No vomiting     Home blood sugar records: fasting range: 107  Episodes of hypoglycemia? No    Current insulin regiment: none Most Recent Eye Exam: 05/21/21 Current exercise: no regular exercise Current diet habits: well balanced  Pertinent Labs: Lab Results  Component Value Date   CHOL 118 03/27/2020   HDL 36 (L) 03/27/2020   LDLCALC 68 03/27/2020   TRIG 66 03/27/2020   CHOLHDL 3.3 03/27/2020   Lab Results  Component Value Date   NA 140 03/27/2020   K 4.4 03/27/2020   CREATININE 0.59 03/27/2020   GFRNONAA 91 03/27/2020   MICROALBUR 20 05/09/2019     ---------------------------------------------------------------------------------------------------  Lipid/Cholesterol, Follow-up  Last lipid panel Other pertinent labs  Lab Results  Component Value Date   CHOL 118 03/27/2020   HDL 36 (L) 03/27/2020   LDLCALC 68 03/27/2020   TRIG 66 03/27/2020   CHOLHDL 3.3 03/27/2020   Lab Results  Component Value Date   ALT 15 03/27/2020   AST 14 03/27/2020   PLT 198 09/05/2019   TSH 1.62  09/05/2019     She was last seen for this 7 months ago.  Management since that visit includes none.  She reports excellent compliance with treatment. She is not having side effects.   Symptoms: No chest pain No chest pressure/discomfort  No dyspnea No lower extremity edema  No numbness or tingling of extremity No orthopnea  No palpitations No paroxysmal nocturnal dyspnea  No speech difficulty No syncope   Current diet: well balanced Current exercise: no regular exercise  The ASCVD Risk score (Arnett DK, et al., 2019) failed to calculate for the following reasons:   The patient has a prior MI or stroke diagnosis  ---------------------------------------------------------------------------------------------------  Depression, Follow-up  She  was last seen for this 7 months ago. Changes made at last visit include none.   She reports excellent compliance with treatment. She is not having side effects.   She reports excellent tolerance of treatment. Current symptoms include:  none She feels she is Unchanged since last visit.  Depression screen [facility] 2/9 06/13/2021 03/27/2020 09/12/2019  Decreased Interest 3 0 0  Down, Depressed, Hopeless 0 0 0  PHQ - 2 Score 3 0 0  Altered sleeping 0 0 0  Tired, decreased energy 0 0 0  Change in appetite 0 0 0  Feeling bad or failure about yourself  0 0 0  Trouble concentrating 3 0 0  Moving slowly or fidgety/restless 0  0 0  Suicidal thoughts 0 0 0  PHQ-9 Score 6 0 0  Difficult doing work/chores Not difficult at all Not difficult at all Not difficult at all    -----------------------------------------------------------------------------------------    Medications: Outpatient Medications Prior to Visit  Medication Sig   Calcium Carbonate-Vitamin D (CALCIUM 600+D) 600-200 MG-UNIT TABS Take by mouth.   Multiple Vitamin (MULTIVITAMIN) tablet Take 1 tablet by mouth daily.   [DISCONTINUED] atorvastatin (LIPITOR) 40 MG tablet Take 1 tablet by  mouth once daily   [DISCONTINUED] glucose blood (ACCU-CHEK GUIDE) test strip USE AS DIRECTED ONCE DAILY TO  CHECK  FASTING  GLUCOSE   [DISCONTINUED] latanoprost (XALATAN) 0.005 % ophthalmic solution Apply to eye.   [DISCONTINUED] metFORMIN (GLUCOPHAGE) 500 MG tablet Take 1 tablet (500 mg total) by mouth 2 (two) times daily with a meal.   [DISCONTINUED] niacin 500 MG tablet Take 500 mg by mouth at bedtime.   [DISCONTINUED] sertraline (ZOLOFT) 50 MG tablet Take 1 tablet by mouth once daily   No facility-administered medications prior to visit.    Review of Systems     Objective    BP (!) 120/59   Pulse 64   Resp 15   Wt 112 lb (50.8 kg)   SpO2 96%   BMI 19.84 kg/m    Physical Exam Vitals and nursing note reviewed. Exam conducted with a chaperone present.  Constitutional:      General: She is not in acute distress.    Appearance: Normal appearance. She is normal weight. She is not ill-appearing, toxic-appearing or diaphoretic.  HENT:     Head: Normocephalic and atraumatic.  Cardiovascular:     Rate and Rhythm: Normal rate and regular rhythm.     Pulses: Normal pulses.     Heart sounds: Normal heart sounds. No murmur heard.   No friction rub. No gallop.  Pulmonary:     Effort: Pulmonary effort is normal. No respiratory distress.     Breath sounds: Normal breath sounds. No stridor. No wheezing, rhonchi or rales.  Chest:     Chest wall: No tenderness.  Abdominal:     General: Bowel sounds are normal.     Palpations: Abdomen is soft.  Musculoskeletal:        General: No swelling, tenderness, deformity or signs of injury. Normal range of motion.     Right lower leg: No edema.     Left lower leg: No edema.  Skin:    General: Skin is warm and dry.     Capillary Refill: Capillary refill takes less than 2 seconds.     Coloration: Skin is not jaundiced or pale.     Findings: No bruising, erythema, lesion or rash.  Neurological:     General: No focal deficit present.      Mental Status: She is alert and oriented to person, place, and time. Mental status is at baseline.     Cranial Nerves: No cranial nerve deficit.     Sensory: No sensory deficit.     Motor: No weakness.     Coordination: Coordination normal.  Psychiatric:        Mood and Affect: Mood normal. Affect is flat.        Speech: Speech is delayed.        Behavior: Behavior is slowed.        Thought Content: Thought content normal. Thought content does not include suicidal ideation. Thought content does not include suicidal plan.  Cognition and Memory: Cognition is impaired. Memory is impaired.        Judgment: Judgment normal.     Results for orders placed or performed in visit on 06/13/21  POCT glycosylated hemoglobin (Hb A1C)  Result Value Ref Range   Hemoglobin A1C 6.3 (A) 4.0 - 5.6 %   HbA1c POC (<> result, manual entry)     HbA1c, POC (prediabetic range)     HbA1c, POC (controlled diabetic range)    Results for orders placed or performed in visit on 06/13/21  HM DIABETES EYE EXAM  Result Value Ref Range   HM Diabetic Eye Exam No Retinopathy No Retinopathy    Assessment & Plan     Problem List Items Addressed This Visit       Endocrine   T2DM (type 2 diabetes mellitus) ([place]) - Primary    A1c improved; continue metformin 6.3% today      Relevant Medications   atorvastatin (LIPITOR) 40 MG tablet   glucose blood (ACCU-CHEK GUIDE) test strip   metFORMIN (GLUCOPHAGE) 500 MG tablet   Other Relevant Orders   Comprehensive metabolic panel   Urine Microalbumin w/creat. ratio   POCT glycosylated hemoglobin (Hb A1C) (Completed)   Comprehensive metabolic panel   Urine Microalbumin w/creat. ratio   Hyperlipidemia associated with type 2 diabetes mellitus (HCC)    Recommend low fat diet Pt and CNA report daily exercise, walking Repeat lipid panel      Relevant Medications   atorvastatin (LIPITOR) 40 MG tablet   metFORMIN (GLUCOPHAGE) 500 MG tablet   niacin 500 MG CR  capsule   Other Relevant Orders   Lipid panel     Musculoskeletal and Integument   Skin tear of hand without complication, left, sequela    S/P fall in garage visiting family Bandage in place Healing nicely CTM as needed        Other   Adjustment disorder with mixed anxiety and depressed mood    Refill of Zoloft; Start Remeron to assist with weight gain      Relevant Medications   sertraline (ZOLOFT) 50 MG tablet   Weight loss    ~10# in 1 year Borderline underweight CNA present for 2 meals a day AMS d/t cognitive process Recent falls- discussed with CNA concerns for bone breaks without some cushion on bone Patient reports walking and does not feel that she can eat 'any more' CNA reports active diet and that she is eating well with CNA supervision Trial of remeron       Relevant Medications   mirtazapine (REMERON SOL-TAB) 30 MG disintegrating tablet   Other Relevant Orders   TSH + free T4   Glaucoma of both eyes    Refill of eye gtts; recent eye appt Pt aware that she uses medication at qhs Denies concerns      Relevant Medications   latanoprost (XALATAN) 0.005 % ophthalmic solution   Encounter for screening mammogram for malignant neoplasm of breast    Due for mammogram screening CNA will check with patient's DIL/DIL will schedule if they want patient to have breast cancer screening      Relevant Orders   MM 3D SCREEN BREAST BILATERAL   Fall    Recent fall Skin tear to L dorsal hand         Return in about 6 months (around 12/12/2021) for T2DM management, chonic disease management.      Vonna Kotyk, FNP, have reviewed all documentation for this visit. The  documentation on 06/13/21 for the exam, diagnosis, procedures, and orders are all accurate and complete.    Gwyneth Sprout, [place] [phone] (phone) [phone] (fax)  [place]

## 2021-06-13 NOTE — Assessment & Plan Note (Signed)
Due for mammogram screening CNA will check with patient's DIL/DIL will schedule if they want patient to have breast cancer screening

## 2021-06-13 NOTE — Assessment & Plan Note (Signed)
A1c improved; continue metformin 6.3% today

## 2021-06-14 LAB — COMPREHENSIVE METABOLIC PANEL WITH GFR
ALT: 22 IU/L (ref 0–32)
AST: 23 IU/L (ref 0–40)
Albumin/Globulin Ratio: 1.5 (ref 1.2–2.2)
Albumin: 4.3 g/dL (ref 3.7–4.7)
Alkaline Phosphatase: 64 IU/L (ref 44–121)
BUN/Creatinine Ratio: 28 (ref 12–28)
BUN: 18 mg/dL (ref 8–27)
Bilirubin Total: 0.5 mg/dL (ref 0.0–1.2)
CO2: 24 mmol/L (ref 20–29)
Calcium: 10 mg/dL (ref 8.7–10.3)
Chloride: 102 mmol/L (ref 96–106)
Creatinine, Ser: 0.64 mg/dL (ref 0.57–1.00)
Globulin, Total: 2.9 g/dL (ref 1.5–4.5)
Glucose: 122 mg/dL — ABNORMAL HIGH (ref 70–99)
Potassium: 4.6 mmol/L (ref 3.5–5.2)
Sodium: 142 mmol/L (ref 134–144)
Total Protein: 7.2 g/dL (ref 6.0–8.5)
eGFR: 93 mL/min/1.73

## 2021-06-14 LAB — TSH+FREE T4
Free T4: 1.37 ng/dL (ref 0.82–1.77)
TSH: 1.86 u[IU]/mL (ref 0.450–4.500)

## 2021-06-14 LAB — LIPID PANEL
Chol/HDL Ratio: 3.4 ratio (ref 0.0–4.4)
Cholesterol, Total: 122 mg/dL (ref 100–199)
HDL: 36 mg/dL — ABNORMAL LOW (ref >39)
LDL Chol Calc (NIH): 72 mg/dL (ref 0–99)
Triglycerides: 68 mg/dL (ref 0–149)
VLDL Cholesterol Cal: 14 mg/dL (ref 5–40)

## 2021-06-14 LAB — MICROALBUMIN / CREATININE URINE RATIO
Creatinine, Urine: 83.6 mg/dL
Microalb/Creat Ratio: 4 mg/g{creat} (ref 0–29)
Microalbumin, Urine: 3.4 ug/mL

## 2021-06-17 NOTE — Progress Notes (Signed)
Hello,    Your lab results have returned. It was a pleasure to see you in the office the other day.  Your risk of heart attack/stroke is elevated based on past history- continue use of Lipitor daily.  HDL/good cholesterol remains low. Recommend increase in diet of healthier fat choices- low fat meats, oils that are not solid at room temperature, nuts, seeds, fish- cod, halibut, salmon, and avocado. Exercise can also increase this number.  Supplemental omega 3's can be taken as well but are not as helpful as dietary/exercise changes.  Tyroid is stable.  Kidneys are stable within DM.  Please let us know if you have any questions.  Thank you,  Tally Joe, FNP

## 2021-06-17 NOTE — Telephone Encounter (Signed)
LM with DIL at number provided. Advised that she reach back out via mychart if she has future questions and briefly explained the use of the medication- rememron, to add a bit of weight to the patient to add some weight to avoid bone breaks if the patient was to fall.  12/12 0815

## 2021-07-23 IMAGING — MR MR HEAD W/O CM
12 series · 45 of 48 positions shown · non-contrast
Comparison: None.

CLINICAL DATA: Memory loss

EXAM:
MRI HEAD WITHOUT CONTRAST
TECHNIQUE: Multiplanar, multiecho pulse sequences of the brain and surrounding
structures were obtained without intravenous contrast.

[Series 5: ax dwi_tracew · axial · 3.0mm · 0.60mm/px · z∈[-100,+55]mm · 4 of 48 slices shown]
[im 1/48]
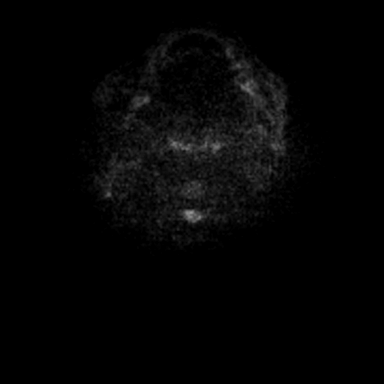
[im 16/48]
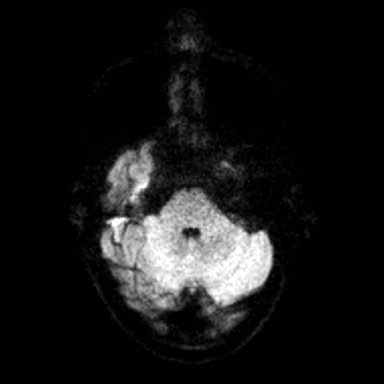
[im 32/48]
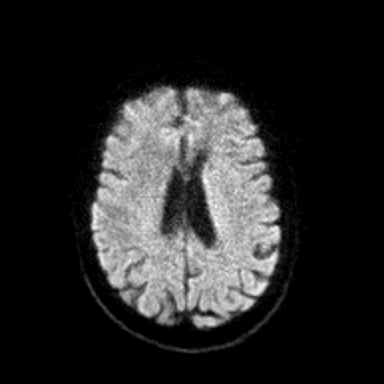
[im 48/48]
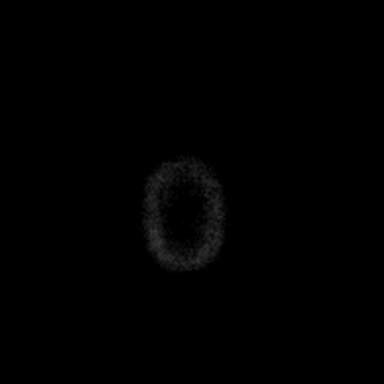

[Series 6: ax dwi_adc · axial · 3.0mm · 0.60mm/px · z∈[-100,+55]mm · 3 of 48 slices shown]
[im 1/48]
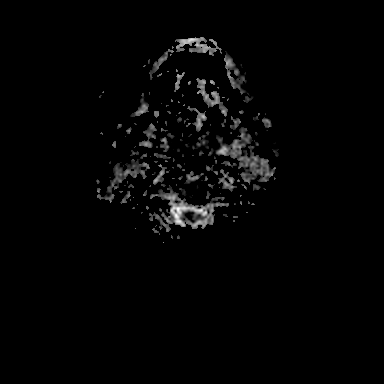
[im 24/48]
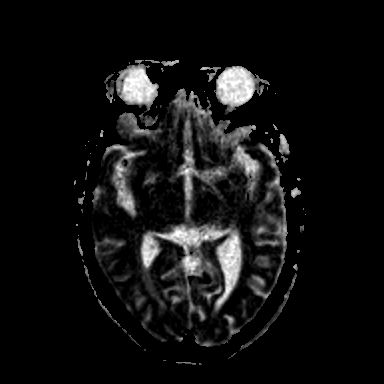
[im 48/48]
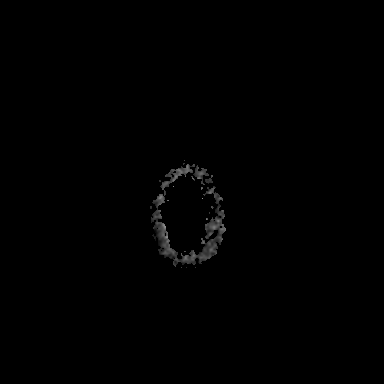

[Series 7: cor dwi_tracew · coronal · 5.0mm · 0.60mm/px · 3 of 38 slices shown]
[im 1/38]
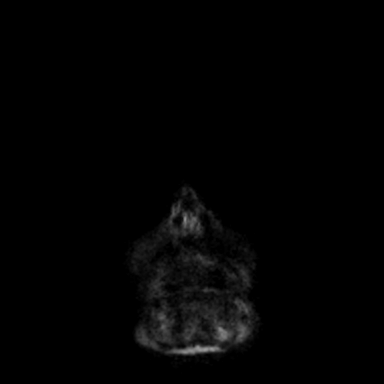
[im 19/38]
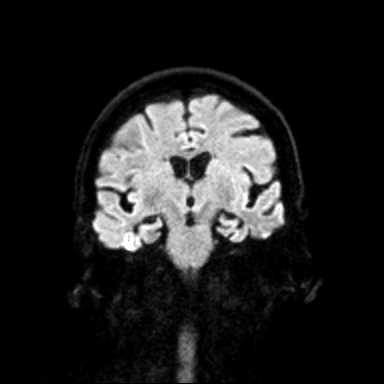
[im 38/38]
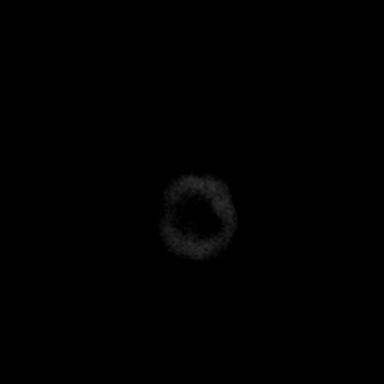

[Series 8: cor dwi_adc · coronal · 5.0mm · 0.60mm/px · 3 of 38 slices shown]
[im 1/38]
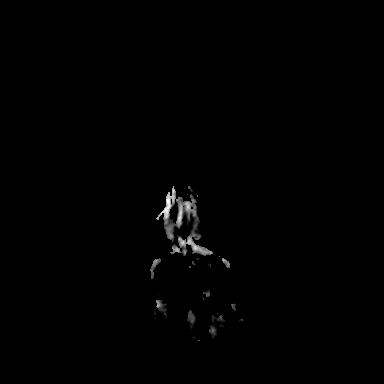
[im 19/38]
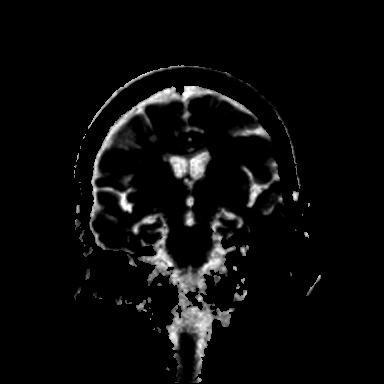
[im 38/38]
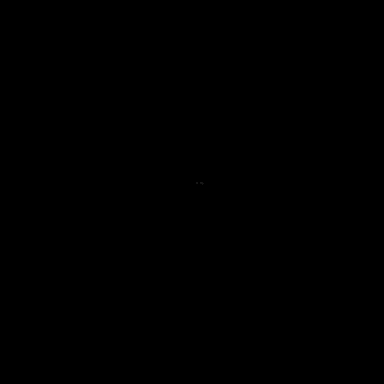

[Series 9: T1 · sagittal · 5.0mm · 0.62mm/px · 2 of 25 slices shown (1 of 2)]
[im 1/25]
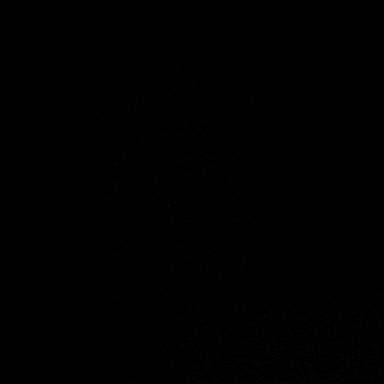
[im 25/25]
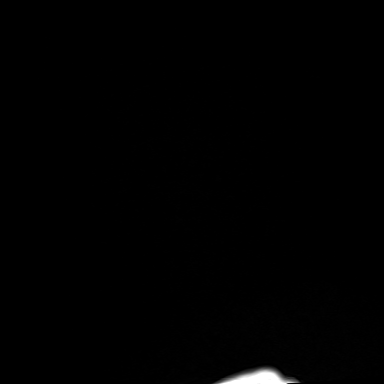

[Series 10: FLAIR · axial · 3.0mm · 0.53mm/px · z∈[-99,+63]mm · 4 of 55 slices shown]
[im 1/55]
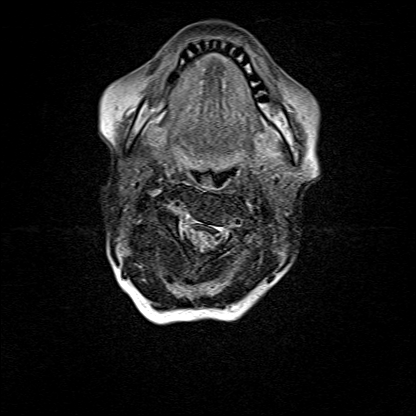
[im 19/55]
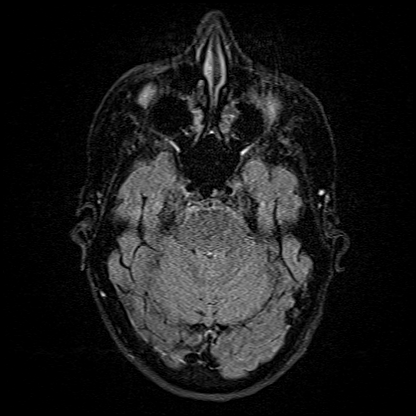
[im 37/55]
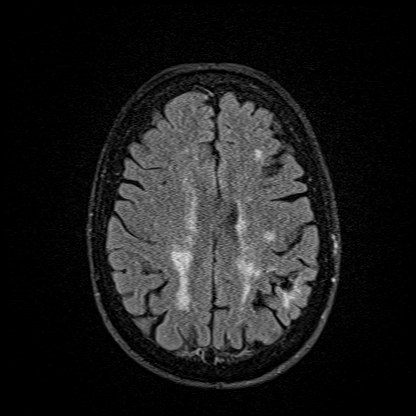
[im 55/55]
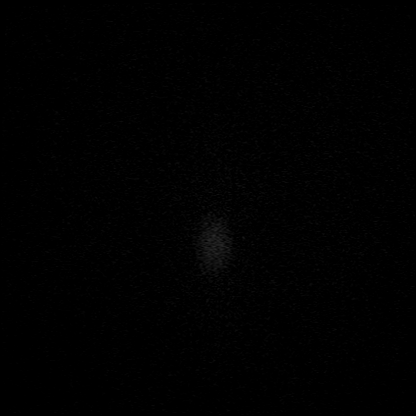

[Series 11: T2 · axial · 5.0mm · 0.53mm/px · z∈[-90,+54]mm · 2 of 25 slices shown (1 of 2)]
[im 1/25]
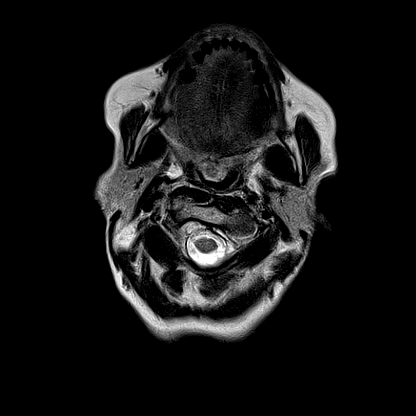
[im 25/25]
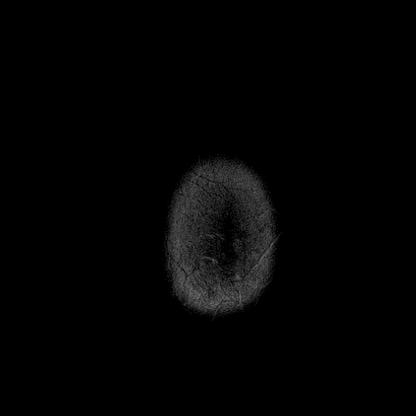

[Series 12: mag_images · axial · 3.0mm · 0.90mm/px · z∈[-107,+70]mm · 4 of 60 slices shown]
[im 1/60]
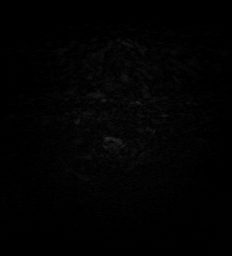
[im 20/60]
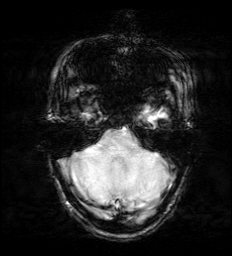
[im 40/60]
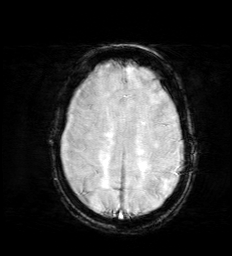
[im 60/60]
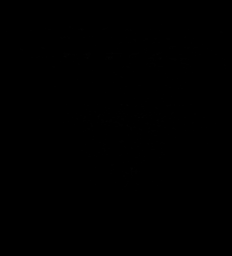

[Series 13: pha_images · axial · 3.0mm · 0.90mm/px · z∈[-107,+70]mm · 4 of 58 slices shown]
[im 1/58]
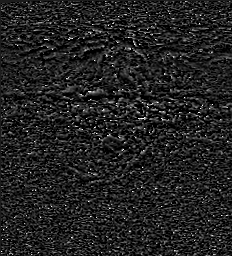
[im 20/58]
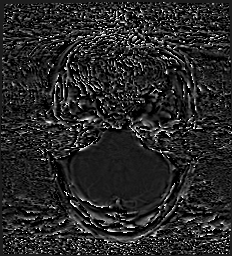
[im 39/58]
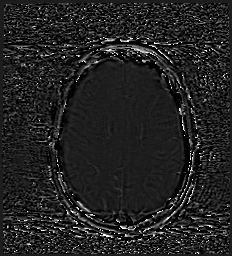
[im 58/58]
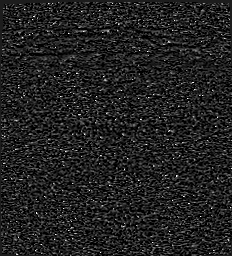

[Series 14: swi_images · axial · 3.0mm · 0.90mm/px · z∈[-107,+70]mm · 4 of 60 slices shown]
[im 1/60]
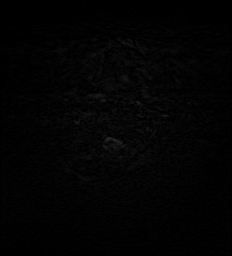
[im 20/60]
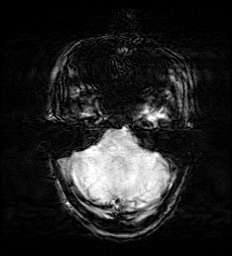
[im 40/60]
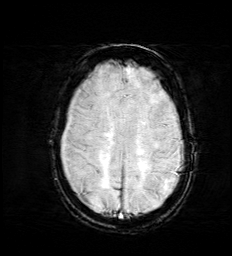
[im 60/60]
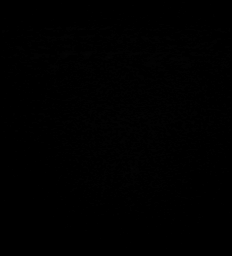

[Series 16: T1 · axial · 1.0mm · 0.98mm/px · z∈[-109,+66]mm · 10 of 176 slices shown (2 of 2)]
[im 1/176]
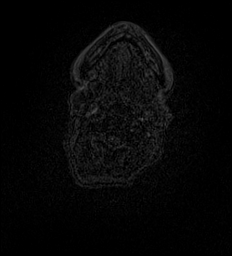
[im 15/176]
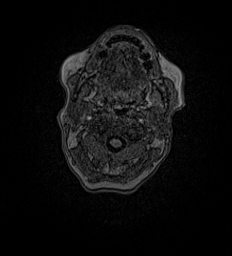
[im 30/176]
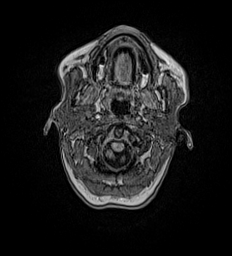
[im 44/176]
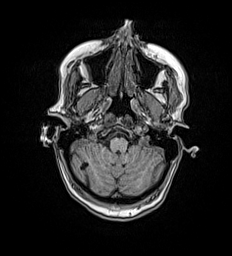
[im 59/176]
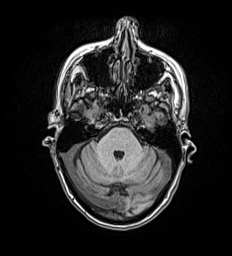
[im 73/176]
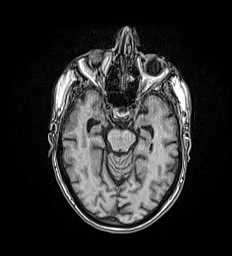
[im 103/176]
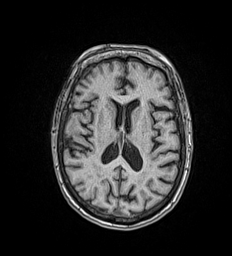
[im 117/176]
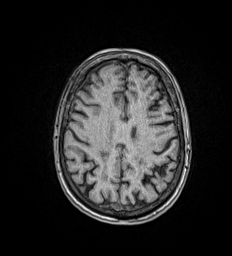
[im 146/176]
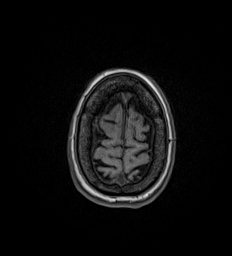
[im 176/176]
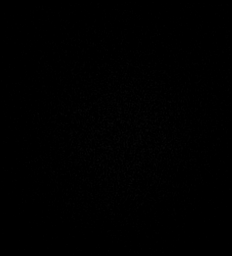

[Series 17: T2 · coronal · 5.0mm · 0.57mm/px · 2 of 29 slices shown (2 of 2)]
[im 1/29]
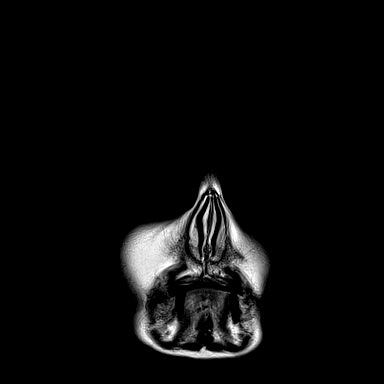
[im 29/29]
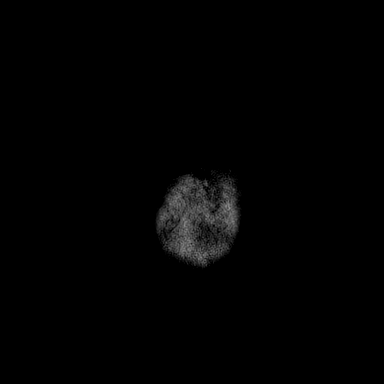

[45 of 48 positions shown; findings below may reference images not displayed]

FINDINGS: Brain: Diffusion imaging does not show any acute or subacute
infarction. Brainstem is normal. Old cerebellar infarctions on the
right. Cerebral hemispheres show old cortical infarctions at the
right parietooccipital junction, the left frontal vertex and the
left parietal vertex. Chronic small-vessel changes are present
affecting the hemispheric white matter. No mass lesion, hemorrhage,
hydrocephalus or extra-axial collection.

Vascular: Major vessels at the base of the brain show flow.

Skull and upper cervical spine: Negative

Sinuses/Orbits: Clear/normal. Previous scleral banding on the right.

Other: None
IMPRESSION: No acute or reversible finding. Old infarctions scattered throughout
the brain as outlined above. Old cortical infarctions are present
within the right cerebellum, the right parietooccipital junction and
in the left frontal and parietal vertex.

## 2021-09-25 ENCOUNTER — Other Ambulatory Visit: Payer: Self-pay | Admitting: Family Medicine

## 2021-09-25 DIAGNOSIS — E1165 Type 2 diabetes mellitus with hyperglycemia: Secondary | ICD-10-CM

## 2021-09-25 DIAGNOSIS — E1169 Type 2 diabetes mellitus with other specified complication: Secondary | ICD-10-CM

## 2021-10-15 ENCOUNTER — Telehealth: Payer: Self-pay

## 2021-10-15 NOTE — Telephone Encounter (Signed)
Copied from [place] [id number]. Topic: General - Call Back - No Documentation ?>> Oct 15, 2021 10:17 AM Terry Watson wrote: ?Reason for CRM: Pt has been summoned for jury duty, will not be able to attend due to medical conditions. Terry Watson POA called with information regarding the summons ?File number: [id number] ?Juror: #[id number] ?Supposed to appear on Tuesday May 30th at 8:30 am  ? ?Pt has Dementia and will not be able to attend. [name] states that she needs an exemption letter from PCP, signed.  ? ?Best contact: [phone]  ?Wants to be contacted once this is completed. ?

## 2021-10-17 ENCOUNTER — Encounter: Payer: Self-pay | Admitting: Family Medicine

## 2021-10-17 NOTE — Telephone Encounter (Signed)
Letter printed and signed and will be left at front desk 250 for pick up by patient/family. ?

## 2021-10-23 NOTE — Telephone Encounter (Signed)
Pts sister called in [place] stating that the pt is needing a more detailed letter for her exemption for jury duty, she states that every medical issues she has needs to be listed. Please advise.  ?

## 2021-10-24 NOTE — Telephone Encounter (Signed)
Have they tried to turn it in? It is the same letter that we write for everyone needing to be excused and no one ever has any trouble.  You do not have to report specific medical conditions to the government office for jury duty. ?

## 2021-10-24 NOTE — Telephone Encounter (Signed)
Terry Watson reports they have not tried to turn it in. I advised as per Dr.  Jacinto Reap. ?

## 2021-11-28 ENCOUNTER — Telehealth: Payer: Self-pay | Admitting: Family Medicine

## 2021-11-28 NOTE — Telephone Encounter (Signed)
Copied from [place] [id number]. Topic: Medicare AWV >> Nov 28, 2021 10:58 AM Cher Nakai R wrote: Reason for CRM:  Left message for patient to call back and schedule Medicare Annual Wellness Visit (AWV) in office.   If unable to come into the office for AWV,  please offer to do virtually or by telephone.  Last AWV:  03/27/2020  Please schedule at anytime with [facility] Health Advisor.  30 minute appointment for Virtual or phone 45 minute appointment for in office or Initial virtual/phone  Any questions, please contact me at [phone]

## 2021-12-17 ENCOUNTER — Other Ambulatory Visit: Payer: Self-pay | Admitting: Family Medicine

## 2021-12-17 DIAGNOSIS — E1169 Type 2 diabetes mellitus with other specified complication: Secondary | ICD-10-CM

## 2021-12-17 DIAGNOSIS — E1165 Type 2 diabetes mellitus with hyperglycemia: Secondary | ICD-10-CM

## 2021-12-17 NOTE — Telephone Encounter (Signed)
Lmtcb to schedule a fu appt.

## 2022-01-20 DIAGNOSIS — F32A Depression, unspecified: Secondary | ICD-10-CM | POA: Insufficient documentation

## 2022-01-20 DIAGNOSIS — M810 Age-related osteoporosis without current pathological fracture: Secondary | ICD-10-CM | POA: Insufficient documentation

## 2022-01-21 ENCOUNTER — Ambulatory Visit (INDEPENDENT_AMBULATORY_CARE_PROVIDER_SITE_OTHER): Payer: Medicare Other

## 2022-01-21 VITALS — BP 120/72 | Ht 63.0 in | Wt 120.4 lb

## 2022-01-21 DIAGNOSIS — Z0000 Encounter for general adult medical examination without abnormal findings: Secondary | ICD-10-CM
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
## 2022-01-21 NOTE — Patient Instructions (Signed)
Terry Watson , Thank you for taking time to come for your Medicare Wellness Visit. I appreciate your ongoing commitment to your health goals. Please review the following plan we discussed and let me know if I can assist you in the future.   Screening recommendations/referrals: Colonoscopy: 04/02/15 Mammogram: 05/16/19, declined referral Bone Density: 05/18/18 Recommended yearly ophthalmology/optometry visit for glaucoma screening and checkup Recommended yearly dental visit for hygiene and checkup  Vaccinations: Influenza vaccine: n/d Pneumococcal vaccine: 05/19/17 Tdap vaccine: n/d Shingles vaccine: Shingrix 06/07/18, 08/31/18   Covid-19:20/20/21, 09/20/19, 04/16/20  Advanced directives: no  Conditions/risks identified: none  Next appointment: Follow up in one year for your annual wellness visit 01/26/23 @ 9:15 am in person   Preventive Care 65 Years and Older, Female Preventive care refers to lifestyle choices and visits with your health care provider that can promote health and wellness. What does preventive care include? A yearly physical exam. This is also called an annual well check. Dental exams once or twice a year. Routine eye exams. Ask your health care provider how often you should have your eyes checked. Personal lifestyle choices, including: Daily care of your teeth and gums. Regular physical activity. Eating a healthy diet. Avoiding tobacco and drug use. Limiting alcohol use. Practicing safe sex. Taking low-dose aspirin every day. Taking vitamin and mineral supplements as recommended by your health care provider. What happens during an annual well check? The services and screenings done by your health care provider during your annual well check will depend on your age, overall health, lifestyle risk factors, and family history of disease. Counseling  Your health care provider may ask you questions about your: Alcohol use. Tobacco use. Drug use. Emotional  well-being. Home and relationship well-being. Sexual activity. Eating habits. History of falls. Memory and ability to understand (cognition). Work and work Statistician. Reproductive health. Screening  You may have the following tests or measurements: Height, weight, and BMI. Blood pressure. Lipid and cholesterol levels. These may be checked every 5 years, or more frequently if you are over 54 years old. Skin check. Lung cancer screening. You may have this screening every year starting at age 67 if you have a 30-pack-year history of smoking and currently smoke or have quit within the past 15 years. Fecal occult blood test (FOBT) of the stool. You may have this test every year starting at age 80. Flexible sigmoidoscopy or colonoscopy. You may have a sigmoidoscopy every 5 years or a colonoscopy every 10 years starting at age 43. Hepatitis C blood test. Hepatitis B blood test. Sexually transmitted disease (STD) testing. Diabetes screening. This is done by checking your blood sugar (glucose) after you have not eaten for a while (fasting). You may have this done every 1-3 years. Bone density scan. This is done to screen for osteoporosis. You may have this done starting at age 71. Mammogram. This may be done every 1-2 years. Talk to your health care provider about how often you should have regular mammograms. Talk with your health care provider about your test results, treatment options, and if necessary, the need for more tests. Vaccines  Your health care provider may recommend certain vaccines, such as: Influenza vaccine. This is recommended every year. Tetanus, diphtheria, and acellular pertussis (Tdap, Td) vaccine. You may need a Td booster every 10 years. Zoster vaccine. You may need this after age 21. Pneumococcal 13-valent conjugate (PCV13) vaccine. One dose is recommended after age 31. Pneumococcal polysaccharide (PPSV23) vaccine. One dose is recommended after age 75. Talk  to your  health care provider about which screenings and vaccines you need and how often you need them. This information is not intended to replace advice given to you by your health care provider. Make sure you discuss any questions you have with your health care provider. Document Released: 07/20/2015 Document Revised: 03/12/2016 Document Reviewed: 04/24/2015 Elsevier Interactive Patient Education  2017 [place] Prevention in the Home Falls can cause injuries. They can happen to people of all ages. There are many things you can do to make your home safe and to help prevent falls. What can I do on the outside of my home? Regularly fix the edges of walkways and driveways and fix any cracks. Remove anything that might make you trip as you walk through a door, such as a raised step or threshold. Trim any bushes or trees on the path to your home. Use bright outdoor lighting. Clear any walking paths of anything that might make someone trip, such as rocks or tools. Regularly check to see if handrails are loose or broken. Make sure that both sides of any steps have handrails. Any raised decks and porches should have guardrails on the edges. Have any leaves, snow, or ice cleared regularly. Use sand or salt on walking paths during winter. Clean up any spills in your garage right away. This includes oil or grease spills. What can I do in the bathroom? Use night lights. Install grab bars by the toilet and in the tub and shower. Do not use towel bars as grab bars. Use non-skid mats or decals in the tub or shower. If you need to sit down in the shower, use a plastic, non-slip stool. Keep the floor dry. Clean up any water that spills on the floor as soon as it happens. Remove soap buildup in the tub or shower regularly. Attach bath mats securely with double-sided non-slip rug tape. Do not have throw rugs and other things on the floor that can make you trip. What can I do in the bedroom? Use night  lights. Make sure that you have a light by your bed that is easy to reach. Do not use any sheets or blankets that are too big for your bed. They should not hang down onto the floor. Have a firm chair that has side arms. You can use this for support while you get dressed. Do not have throw rugs and other things on the floor that can make you trip. What can I do in the kitchen? Clean up any spills right away. Avoid walking on wet floors. Keep items that you use a lot in easy-to-reach places. If you need to reach something above you, use a strong step stool that has a grab bar. Keep electrical cords out of the way. Do not use floor polish or wax that makes floors slippery. If you must use wax, use non-skid floor wax. Do not have throw rugs and other things on the floor that can make you trip. What can I do with my stairs? Do not leave any items on the stairs. Make sure that there are handrails on both sides of the stairs and use them. Fix handrails that are broken or loose. Make sure that handrails are as long as the stairways. Check any carpeting to make sure that it is firmly attached to the stairs. Fix any carpet that is loose or worn. Avoid having throw rugs at the top or bottom of the stairs. If you do have throw rugs,  attach them to the floor with carpet tape. Make sure that you have a light switch at the top of the stairs and the bottom of the stairs. If you do not have them, ask someone to add them for you. What else can I do to help prevent falls? Wear shoes that: Do not have high heels. Have rubber bottoms. Are comfortable and fit you well. Are closed at the toe. Do not wear sandals. If you use a stepladder: Make sure that it is fully opened. Do not climb a closed stepladder. Make sure that both sides of the stepladder are locked into place. Ask someone to hold it for you, if possible. Clearly mark and make sure that you can see: Any grab bars or handrails. First and last  steps. Where the edge of each step is. Use tools that help you move around (mobility aids) if they are needed. These include: Canes. Walkers. Scooters. Crutches. Turn on the lights when you go into a dark area. Replace any light bulbs as soon as they burn out. Set up your furniture so you have a clear path. Avoid moving your furniture around. If any of your floors are uneven, fix them. If there are any pets around you, be aware of where they are. Review your medicines with your doctor. Some medicines can make you feel dizzy. This can increase your chance of falling. Ask your doctor what other things that you can do to help prevent falls. This information is not intended to replace advice given to you by your health care provider. Make sure you discuss any questions you have with your health care provider. Document Released: 04/19/2009 Document Revised: 11/29/2015 Document Reviewed: 07/28/2014 Elsevier Interactive Patient Education  2017 [employer].

## 2022-01-27 ENCOUNTER — Telehealth: Payer: Self-pay | Admitting: Family Medicine

## 2022-01-27 NOTE — Telephone Encounter (Signed)
The daughter in law, [name] called in stating the family needs an FL2 form filled out because they are strongly considering putting the patient in an Assisted Living/Residential Care Home. The facility also mentioned they would like her medicines listed on cards if that is possible. Please assist further.

## 2022-01-28 NOTE — Telephone Encounter (Signed)
I called and spoke with patient's daughter. She doesn't have an FL2 form yet. She is unsure which facility she wants to use. She plans to decide this weekend. She will pick up an FL2 form at that time. Appointment has been scheduled for a face to face on 02/06/2022 at 11am.

## 2022-02-05 ENCOUNTER — Ambulatory Visit (INDEPENDENT_AMBULATORY_CARE_PROVIDER_SITE_OTHER): Payer: Medicare Other | Admitting: Family Medicine

## 2022-02-05 ENCOUNTER — Encounter: Payer: Self-pay | Admitting: Family Medicine

## 2022-02-05 VITALS — BP 136/68 | HR 66 | Temp 98.4°F | Resp 16 | Wt 123.0 lb

## 2022-02-05 DIAGNOSIS — R413 Other amnesia: Secondary | ICD-10-CM | POA: Diagnosis not present

## 2022-02-05 DIAGNOSIS — Z8673 Personal history of transient ischemic attack (TIA), and cerebral infarction without residual deficits: Secondary | ICD-10-CM | POA: Diagnosis not present

## 2022-02-05 DIAGNOSIS — E1165 Type 2 diabetes mellitus with hyperglycemia: Secondary | ICD-10-CM

## 2022-02-05 DIAGNOSIS — Z0289 Encounter for other administrative examinations: Secondary | ICD-10-CM | POA: Diagnosis not present
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
## 2022-02-05 NOTE — Progress Notes (Signed)
I,Jana Robinson,acting as a Education administrator for Gwyneth Sprout, FNP.,have documented all relevant documentation on the behalf of Gwyneth Sprout, FNP,as directed by  Gwyneth Sprout, FNP while in the presence of Gwyneth Sprout, FNP.   Established patient visit   Patient: Terry Watson   DOB: Apr 20, 1946   76 y.o. Female  MRN: [id number] Visit Date: 02/05/2022  Today's healthcare provider: Gwyneth Sprout, FNP  Introduced to nurse practitioner role and practice setting.  All questions answered.  Discussed provider/patient relationship and expectations.   No chief complaint on file.  Subjective    Patient presents for face to face evaluation for assisted living placement and FL2 form completed.    Medications: Outpatient Medications Prior to Visit  Medication Sig   atorvastatin (LIPITOR) 40 MG tablet Take 1 tablet (40 mg total) by mouth daily.   Calcium Carbonate-Vitamin D (CALCIUM 600+D) 600-200 MG-UNIT TABS Take by mouth.   glucose blood (ACCU-CHEK GUIDE) test strip CHECK GLUCOSE ONCE DAILY . APPOINTMENT REQUIRED FOR FUTURE REFILL. Courtesy refill #2   latanoprost (XALATAN) 0.005 % ophthalmic solution Place 1 drop into both eyes at bedtime.   metFORMIN (GLUCOPHAGE) 500 MG tablet Take 1 tablet (500 mg total) by mouth 2 (two) times daily with a meal.   Multiple Vitamin (MULTIVITAMIN) tablet Take 1 tablet by mouth daily.   niacin 500 MG CR capsule Take 1 capsule (500 mg total) by mouth at bedtime.   sertraline (ZOLOFT) 50 MG tablet Take 1 tablet (50 mg total) by mouth daily.   [DISCONTINUED] mirtazapine (REMERON SOL-TAB) 30 MG disintegrating tablet Take 1 tablet (30 mg total) by mouth at bedtime.   [DISCONTINUED] mirtazapine (REMERON SOL-TAB) 30 MG disintegrating tablet Take by mouth.   No facility-administered medications prior to visit.    Review of Systems  Last CBC Lab Results  Component Value Date   WBC 5.9 09/05/2019   HGB 14.3 09/05/2019   HCT 45 09/05/2019   MCV 93 05/25/2019    MCH 31.0 05/25/2019   RDW 12.6 05/25/2019   PLT 198 81/85/6314   Last metabolic panel Lab Results  Component Value Date   GLUCOSE 122 (H) 06/13/2021   NA 142 06/13/2021   K 4.6 06/13/2021   CL 102 06/13/2021   CO2 24 06/13/2021   BUN 18 06/13/2021   CREATININE 0.64 06/13/2021   EGFR 93 06/13/2021   CALCIUM 10.0 06/13/2021   PROT 7.2 06/13/2021   ALBUMIN 4.3 06/13/2021   LABGLOB 2.9 06/13/2021   AGRATIO 1.5 06/13/2021   BILITOT 0.5 06/13/2021   ALKPHOS 64 06/13/2021   AST 23 06/13/2021   ALT 22 06/13/2021   Last lipids Lab Results  Component Value Date   CHOL 122 06/13/2021   HDL 36 (L) 06/13/2021   LDLCALC 72 06/13/2021   TRIG 68 06/13/2021   CHOLHDL 3.4 06/13/2021   Last hemoglobin A1c Lab Results  Component Value Date   HGBA1C 6.3 (A) 06/13/2021   Last thyroid functions Lab Results  Component Value Date   TSH 1.860 06/13/2021   Last vitamin D Lab Results  Component Value Date   VD25OH 28 09/05/2019   Last vitamin B12 and Folate Lab Results  Component Value Date   VITAMINB12 575 09/05/2019       Objective    BP 136/68 (BP Location: Right Arm, Patient Position: Sitting, Cuff Size: Normal)   Pulse 66   Temp 98.4 F (36.9 C) (Oral)   Resp 16   Wt 123  lb (55.8 kg)   SpO2 97%   BMI 21.79 kg/m  BP Readings from Last 3 Encounters:  02/05/22 136/68  01/21/22 120/72  06/13/21 (!) 120/59   Wt Readings from Last 3 Encounters:  02/05/22 123 lb (55.8 kg)  01/21/22 120 lb 6.4 oz (54.6 kg)  06/13/21 112 lb (50.8 kg)   SpO2 Readings from Last 3 Encounters:  02/05/22 97%  06/13/21 96%  03/27/20 100%      Physical Exam Vitals and nursing note reviewed.  Constitutional:      General: She is not in acute distress.    Appearance: Normal appearance. She is normal weight. She is not ill-appearing, toxic-appearing or diaphoretic.  HENT:     Head: Normocephalic and atraumatic.  Cardiovascular:     Rate and Rhythm: Normal rate and regular rhythm.      Pulses: Normal pulses.     Heart sounds: Normal heart sounds. No murmur heard.    No friction rub. No gallop.  Pulmonary:     Effort: Pulmonary effort is normal. No respiratory distress.     Breath sounds: Normal breath sounds. No stridor. No wheezing, rhonchi or rales.  Chest:     Chest wall: No tenderness.  Abdominal:     General: Bowel sounds are normal.     Palpations: Abdomen is soft.  Musculoskeletal:        General: No swelling, tenderness, deformity or signs of injury. Normal range of motion.     Right lower leg: No edema.     Left lower leg: No edema.  Skin:    General: Skin is warm and dry.     Capillary Refill: Capillary refill takes less than 2 seconds.     Coloration: Skin is not jaundiced or pale.     Findings: No bruising, erythema, lesion or rash.  Neurological:     General: No focal deficit present.     Mental Status: She is alert. Mental status is at baseline. She is disoriented.     Cranial Nerves: No cranial nerve deficit.     Sensory: No sensory deficit.     Motor: No weakness.     Coordination: Coordination normal.     Gait: Gait abnormal.  Psychiatric:        Attention and Perception: She is inattentive.        Mood and Affect: Mood normal. Affect is flat.        Speech: Speech is delayed.        Behavior: Behavior is slowed. Behavior is cooperative.        Thought Content: Thought content normal. Thought content does not include suicidal ideation. Thought content does not include suicidal plan.        Cognition and Memory: Cognition is impaired. Memory is impaired. She exhibits impaired recent memory and impaired remote memory.      No results found for any visits on 02/05/22.  Assessment & Plan     Problem List Items Addressed This Visit       Endocrine   Type 2 diabetes mellitus with hyperglycemia, without long-term current use of insulin (HCC)    Chronic, stable Last A1c 6.3% On Metformin 500 mg BID Working on dietary choice Continue to  recommend balanced, lower carb meals. Smaller meal size, adding snacks. Choosing water as drink of choice and increasing purposeful exercise.         Other   Encounter for completion of form with patient    Request for completion  of FL2 form with [facility]      History of stroke    Hx of multiple strokes No residual outside of cognitive deficits      Memory loss - Primary    Chronic, worsening Poor safety awareness Ex wandering into road when getting her mail not looking for cars Previous referred to neurology Has in home aide for 4 hours 5 days a week; family assistance evening/weekends        Return if symptoms worsen or fail to improve.      Vonna Kotyk, FNP, have reviewed all documentation for this visit. The documentation on 02/05/22 for the exam, diagnosis, procedures, and orders are all accurate and complete.    Gwyneth Sprout, [place] [phone] (phone) [phone] (fax)  [place]

## 2022-02-05 NOTE — Assessment & Plan Note (Signed)
Request for completion of FL2 form with D[facility]

## 2022-02-05 NOTE — Assessment & Plan Note (Signed)
Hx of multiple strokes No residual outside of cognitive deficits

## 2022-02-05 NOTE — Assessment & Plan Note (Signed)
Chronic, stable Last A1c 6.3% On Metformin 500 mg BID Working on dietary choice Continue to recommend balanced, lower carb meals. Smaller meal size, adding snacks. Choosing water as drink of choice and increasing purposeful exercise.

## 2022-02-07 ENCOUNTER — Other Ambulatory Visit: Payer: Self-pay | Admitting: Family Medicine

## 2022-02-07 DIAGNOSIS — E1165 Type 2 diabetes mellitus with hyperglycemia: Secondary | ICD-10-CM

## 2022-02-07 DIAGNOSIS — E1169 Type 2 diabetes mellitus with other specified complication: Secondary | ICD-10-CM

## 2022-02-07 MED ORDER — ACCU-CHEK GUIDE VI STRP: ORAL_STRIP | 3 refills | Status: AC

## 2022-02-20 ENCOUNTER — Other Ambulatory Visit (INDEPENDENT_AMBULATORY_CARE_PROVIDER_SITE_OTHER): Payer: Medicare Other

## 2022-02-20 ENCOUNTER — Other Ambulatory Visit: Payer: Self-pay | Admitting: Family Medicine

## 2022-02-20 DIAGNOSIS — R300 Dysuria: Secondary | ICD-10-CM | POA: Diagnosis not present

## 2022-02-20 DIAGNOSIS — R410 Disorientation, unspecified: Secondary | ICD-10-CM

## 2022-02-21 LAB — POCT URINALYSIS DIPSTICK
Bilirubin, UA: NEGATIVE
Blood, UA: NEGATIVE
Glucose, UA: NEGATIVE
Ketones, UA: NEGATIVE
Leukocytes, UA: NEGATIVE
Nitrite, UA: NEGATIVE
Protein, UA: NEGATIVE
Spec Grav, UA: 1.01 (ref 1.010–1.025)
Urobilinogen, UA: 0.2 E.U./dL
pH, UA: 6.5 (ref 5.0–8.0)

## 2022-02-24 ENCOUNTER — Telehealth: Payer: Self-pay

## 2022-02-24 ENCOUNTER — Ambulatory Visit: Payer: Self-pay | Admitting: *Deleted

## 2022-02-24 LAB — URINE CULTURE

## 2022-02-24 MED ORDER — CIPROFLOXACIN HCL 250 MG PO TABS: 250.0000 mg | ORAL_TABLET | Freq: Two times a day (BID) | ORAL | 0 refills | Status: AC

## 2022-02-24 NOTE — Telephone Encounter (Signed)
Tammy advised of urine culture. Telephone # [phone].

## 2022-02-24 NOTE — Telephone Encounter (Signed)
Spoke to pt's daughter [name], advised Med called in to preferred pharmacy. States she was alerted by pharmacy. Reports mother with some increased confusion, due to infection. Advised to CB if needed.

## 2022-02-24 NOTE — Telephone Encounter (Signed)
Patients daughter in law patty, called in states,home  urine test was done by [facility] family of[facility], no uUTI was confirmed, but patients urine is strong and and yellow and she wants to know will medicine still be prescribed.

## 2022-02-24 NOTE — Telephone Encounter (Signed)
-----   Message from Gwyneth Sprout, FNP sent at 02/24/2022  8:17 AM EDT ----- Recommend treatment with Cipro 250 mg for 7 days; BID dosing. Please let me know if I need to order this Abx.  Thanks  Gwyneth Sprout, [address] [place], [place] [address] [phone] (phone) [phone] (fax) [place]  ----- Message ----- From: Smitty Knudsen, CMA Sent: 02/21/2022   3:31 PM EDT To: Gwyneth Sprout, FNP

## 2022-02-27 ENCOUNTER — Other Ambulatory Visit: Payer: Self-pay | Admitting: Family Medicine

## 2022-02-27 DIAGNOSIS — R410 Disorientation, unspecified: Secondary | ICD-10-CM

## 2022-02-27 DIAGNOSIS — R2689 Other abnormalities of gait and mobility: Secondary | ICD-10-CM

## 2022-02-27 DIAGNOSIS — Z8673 Personal history of transient ischemic attack (TIA), and cerebral infarction without residual deficits: Secondary | ICD-10-CM

## 2022-02-27 DIAGNOSIS — R413 Other amnesia: Secondary | ICD-10-CM

## 2022-02-28 ENCOUNTER — Telehealth: Payer: Self-pay | Admitting: Family Medicine

## 2022-02-28 NOTE — Telephone Encounter (Signed)
Spoke with [facility] and clarified order.

## 2022-02-28 NOTE — Telephone Encounter (Signed)
Caller needing clarification if referral is for PT and OT  Caller states patient's daughter does not want therapy to start until 03-04-2022  Please assist further  Phone [phone]  option @ 2

## 2022-03-04 ENCOUNTER — Telehealth: Payer: Self-pay

## 2022-03-04 NOTE — Telephone Encounter (Signed)
Copied from [place] [id number]. Topic: Quick Communication - Home Health Verbal Orders >> Mar 04, 2022  4:07 PM Everette C wrote: Caller/Agency: Jenny Reichmann / [place] Number: [phone] Requesting OT/PT/Skilled Nursing/Social Work/Speech Therapy: PT Frequency: 1w1 2w2 1w3 Requesting OT/PT/Skilled Nursing/Social Work/Speech Therapy: Speech Therapy  Frequency: evaluation for cognition

## 2022-03-05 NOTE — Telephone Encounter (Signed)
Ok to order 

## 2022-03-06 NOTE — Telephone Encounter (Signed)
Left detailed message giving verbal okay for orders.

## 2022-03-07 ENCOUNTER — Telehealth: Payer: Self-pay

## 2022-03-07 NOTE — Telephone Encounter (Signed)
Tye Maryland calling from [place] [facility] is calling for orders for speech therapy 03/10/22 2 times a week 2 weeks CB- [phone]

## 2022-03-07 NOTE — Telephone Encounter (Unsigned)
Copied from [place] [phone]. Topic: Quick Communication - Home Health Verbal Orders >> Mar 07, 2022  1:47 PM Everette C wrote: Caller/Agency: [place] / [place] [facility] [place] Number: [phone] Requesting OT/PT/Skilled Nursing/Social Work/Speech Therapy: PT  Frequency: 1w4

## 2022-03-11 ENCOUNTER — Telehealth: Payer: Self-pay

## 2022-03-11 ENCOUNTER — Ambulatory Visit: Payer: Self-pay

## 2022-03-11 NOTE — Telephone Encounter (Signed)
Please advise 

## 2022-03-11 NOTE — Telephone Encounter (Signed)
Left detailed message on voicemail advising of approved orders.

## 2022-03-11 NOTE — Telephone Encounter (Signed)
Returned call to patient's daughter Terry Watson.   She states that BG was initially 200 this AM fasting  At 9:00-9:30, BG was >524  PP  At 10:00AM, BG was 411  11:15 AM BG was 176   Daughter states that they did not go to the ED as the BG was decreased and stated that hyperglycemia was due to breakfast waffles.   Daughter reports that BG normally ranges in 120s  She states that she has reviewed her mother's diet with the staff at the home.   Recommended visit in office if BG become persistently elevated despite dietary adjustments that are more diabetes friendly. Daughter was in agreement and states that if not BFP, they would see the patient's endocrinologist if any persistent abnormalities.    Eulis Foster, MD  [facility]  [phone]

## 2022-03-11 NOTE — Telephone Encounter (Addendum)
  Chief Complaint: BS 524  Symptoms: none noted per staff Frequency: this am Pertinent Negatives: Patient denies any sx Disposition: '[]'$ ED /'[]'$ Urgent Care (no appt availability in office) / '[]'$ Appointment(In office/virtual)/ '[]'$  [place] [facility]/ '[]'$ Home Care/ '[]'$ Refused Recommended Disposition /'[]'$ [place] [facility]/ '[]'$  Follow-up with PCP Additional Notes: Langley Gauss would like PCP triage first. Advised to take pt to ED. But stated will take to hospital if provider wants her to go. Langley Gauss is care giver please call her back at [phone]. Called office and spoke with Judson Roch who will send message to [facility]). Reason for Disposition  Blood glucose > 500 mg/dL (27.8 mmol/L)  Answer Assessment - Initial Assessment Questions 1. BLOOD GLUCOSE: "What is your blood glucose level?"      524  earlier BS before bfast-  2. ONSET: "When did you check the blood glucose?"     0940 3. USUAL RANGE: "What is your glucose level usually?" (e.g., usual fasting morning value, usual evening value)     unsure 4. KETONES: "Do you check for ketones (urine or blood test strips)?" If Yes, ask: "What does the test show now?"      no 5. TYPE 1 or 2:  "Do you know what type of diabetes you have?"  (e.g., Type 1, Type 2, Gestational; doesn't know)      Type 2  6. INSULIN: "Do you take insulin?" "What type of insulin(s) do you use? What is the mode of delivery? (syringe, pen; injection or pump)?"      no 7. DIABETES PILLS: "Do you take any pills for your diabetes?" If Yes, ask: "Have you missed taking any pills recently?"     Metformin- no 8. OTHER SYMPTOMS: "Do you have any symptoms?" (e.g., fever, frequent urination, difficulty breathing, dizziness, weakness, vomiting)     *No Answer* 9. PREGNANCY: "Is there any chance you are pregnant?" "When was your last menstrual period?"     N/a  Protocols used: Diabetes - High Blood Sugar-A-AH

## 2022-03-11 NOTE — Telephone Encounter (Signed)
Reviewed. Agree with acute evaluation in ED.   Eulis Foster, MD [facility]

## 2022-03-11 NOTE — Telephone Encounter (Signed)
Facility has been advised to take patient to ER

## 2022-03-11 NOTE — Telephone Encounter (Signed)
Copied from [place]. Topic: General - Other >> Mar 11, 2022 10:33 AM Oley Balm E wrote: Reason for CRM: Pt's daughter in law called to report that she is fed foods that she should not be eating at her facility. The daughter in law states that she is fed breads, two starches and a dessert with every meal. She said they are not going to take the patient to the ER per Nurse triage advice.   Terry Watson [phone]

## 2022-03-11 NOTE — Telephone Encounter (Signed)
Left detailed message giving verbal okay. 

## 2022-03-19 ENCOUNTER — Ambulatory Visit: Payer: Self-pay | Admitting: *Deleted

## 2022-03-19 NOTE — Telephone Encounter (Signed)
Reason for Disposition  [1] Caller has NON-URGENT medication or insulin pump question AND [2] triager unable to answer question    Nurse with [facility] reporting on glucose being elevated this week.  Pt in an assisted living facility  Answer Assessment - Initial Assessment Questions 1. BLOOD GLUCOSE: "What is your blood glucose level?"      215 glucose this morning.    It's been in the 200s all week.   She's on metformin.   This week her sugar is over 200. 2. ONSET: "When did you check the blood glucose?"     This morning it's 215.     She resides in a assisted living facility.   3. USUAL RANGE: "What is your glucose level usually?" (e.g., usual fasting morning value, usual evening value)     Not sure. 4. KETONES: "Do you check for ketones (urine or blood test strips)?" If Yes, ask: "What does the test show now?"      N/A 5. TYPE 1 or 2:  "Do you know what type of diabetes you have?"  (e.g., Type 1, Type 2, Gestational; doesn't know)      Type 2  6. INSULIN: "Do you take insulin?" "What type of insulin(s) do you use? What is the mode of delivery? (syringe, pen; injection or pump)?"      No 7. DIABETES PILLS: "Do you take any pills for your diabetes?" If Yes, ask: "Have you missed taking any pills recently?"     metformin 8. OTHER SYMPTOMS: "Do you have any symptoms?" (e.g., fever, frequent urination, difficulty breathing, dizziness, weakness, vomiting)     She has severe dementia.    She can't communicate.    9. PREGNANCY: "Is there any chance you are pregnant?" "When was your last menstrual period?"     N/A  Protocols used: Diabetes - High Blood Sugar-A-AH

## 2022-03-19 NOTE — Telephone Encounter (Signed)
  Chief Complaint: elevated glucose in 200s this past week.   Vinnie Level nurse with [facility] visited pt in [facility] in [place] to report this pt's glucoses have been in the 200s this week.   Symptoms: No symptoms that they can tell.   Pt has severe dementia.    Frequency: This past week elevated glucoses Pertinent Negatives: Patient denies symptoms that staff has reported or can tell from pt.   She is non communicative.   Disposition: '[]'$ ED /'[]'$ Urgent Care (no appt availability in office) / '[]'$ Appointment(In office/virtual)/ '[]'$  [place] [facility]/ '[]'$ Home Care/ '[]'$ Refused Recommended Disposition /'[]'$ [place] [facility]/ '[x]'$  Follow-up with PCP Additional Notes: This information has been sent to [facility].   If orders are to be given Vinnie Level with [facility] said to contact the staff at [facility] in [place] directly.

## 2022-03-20 ENCOUNTER — Telehealth: Payer: Self-pay | Admitting: Family Medicine

## 2022-03-20 DIAGNOSIS — R350 Frequency of micturition: Secondary | ICD-10-CM

## 2022-03-20 NOTE — Telephone Encounter (Unsigned)
Copied from [place] [phone]. Topic: Quick Communication - Home Health Verbal Orders >> Mar 20, 2022 10:58 AM Cyndi Bender wrote: Caller/Agency: Jamelle Haring with [facility] Number: [phone]  Fax# [phone] for assistant living location Requesting OT/PT/Skilled Nursing/Social Work/Speech Therapy: Pt lives in assistant living home. Cathy request orders to collect urine sample for possible uti  Tye Maryland asked that the order be faxed to the assistant living location at fax# [phone]

## 2022-03-21 NOTE — Telephone Encounter (Signed)
Kennyth Lose from [facility] assisted [facility] came in to get a urine cup for this pt.  There is no response to this message and no order for a urine sample.  I let her know that we would call them and let them know once a provider responds.  Her direct number is [phone].  She states that if they could get a response before lunch it would be helpful to get this done before the weekend.

## 2022-03-24 ENCOUNTER — Encounter: Payer: Self-pay | Admitting: Family Medicine

## 2022-03-24 ENCOUNTER — Telehealth: Payer: Self-pay | Admitting: Family Medicine

## 2022-03-24 ENCOUNTER — Ambulatory Visit (INDEPENDENT_AMBULATORY_CARE_PROVIDER_SITE_OTHER): Payer: Medicare Other | Admitting: Family Medicine

## 2022-03-24 VITALS — BP 122/67 | HR 69 | Temp 98.3°F | Resp 16 | Wt 117.0 lb

## 2022-03-24 DIAGNOSIS — E1165 Type 2 diabetes mellitus with hyperglycemia: Secondary | ICD-10-CM

## 2022-03-24 NOTE — Patient Instructions (Signed)
  Diet Recommendations for Diabetes   1. Eat at least 3 meals and 1-2 snacks per day. Never go more than 4-5 hours while awake without eating. Eat breakfast within the first hour of getting up.   2. Limit starchy foods to TWO per meal and ONE per snack. ONE portion of a starchy  food is equal to the following:   - ONE slice of bread (or its equivalent, such as half of a hamburger bun).   - 1/2 cup of a "scoopable" starchy food such as potatoes or rice.   - 15 grams of Total Carbohydrate as shown on food label.  3. Include at every meal: a protein food, a carb food, and vegetables and/or fruit.   - Obtain twice the volume of vegetables as protein or carbohydrate foods for both lunch and dinner.   - Fresh or frozen vegetables are best.   - Keep frozen vegetables on hand for a quick vegetable serving.       Starchy (carb) foods: Bread, rice, pasta, potatoes, corn, cereal, grits, crackers, bagels, muffins, all baked goods.  (Fruits, milk, and yogurt also have carbohydrate, but most of these foods will not spike your blood sugar as most starchy foods will.)  A few fruits do cause high blood sugars; use small portions of bananas (limit to 1/2 at a time), grapes, watermelon, oranges, and most tropical fruits.    Protein foods: Meat, fish, poultry, eggs, dairy foods, and beans such as pinto and kidney beans (beans also provide carbohydrate).

## 2022-03-24 NOTE — Telephone Encounter (Signed)
Esther from [facility] OT called to report that the patient is not in need of OT services at this time.  Best contact [phone]

## 2022-03-24 NOTE — Assessment & Plan Note (Addendum)
Elevated blood sugars likely 2/2 more starchy diet and less exercise than previous. Counseled on diabetic diet, exercise, handout provided. Borderline weight, no goals for weight loss. Obtain A1c and adjust regimen as indicated with goal <8. F/u 6 months.

## 2022-03-24 NOTE — Progress Notes (Signed)
    SUBJECTIVE:   CHIEF COMPLAINT / HPI:   Diabetes, Type 2 - Last A1c 6.3 06/2021 - Medications: metformin - Compliance: good - Checking BG at home: yes, 200s recently.  - Diet: more consistent meals since starting at ALF. Daughter feels meals are more starchy than she is used to - Exercise: not walking as much as previous, typically will walk on treadmill. - Eye exam: UTD - Foot exam: UTD - Microalbumin: UTD - Statin: yes - Denies symptoms of hypoglycemia, polyuria, polydipsia,    OBJECTIVE:   BP 122/67 (BP Location: Left Arm, Patient Position: Sitting, Cuff Size: Normal)   Pulse 69   Temp 98.3 F (36.8 C) (Oral)   Resp 16   Wt 117 lb (53.1 kg)   BMI 20.73 kg/m   Gen: well appearing, in NAD Card: RRR Lungs: CTAB Ext: WWP, no edema  ASSESSMENT/PLAN:   Type 2 diabetes mellitus with hyperglycemia, without long-term current use of insulin (HCC) Elevated blood sugars likely 2/2 more starchy diet and less exercise than previous. Counseled on diabetic diet, exercise, handout provided. Borderline weight, no goals for weight loss. Obtain A1c and adjust regimen as indicated with goal <8. F/u 6 months.     Myles Gip, DO

## 2022-03-25 LAB — BASIC METABOLIC PANEL
BUN/Creatinine Ratio: 20 (ref 12–28)
BUN: 12 mg/dL (ref 8–27)
CO2: 25 mmol/L (ref 20–29)
Calcium: 9.7 mg/dL (ref 8.7–10.3)
Chloride: 104 mmol/L (ref 96–106)
Creatinine, Ser: 0.61 mg/dL (ref 0.57–1.00)
Glucose: 163 mg/dL — ABNORMAL HIGH (ref 70–99)
Potassium: 4.1 mmol/L (ref 3.5–5.2)
Sodium: 145 mmol/L — ABNORMAL HIGH (ref 134–144)
eGFR: 93 mL/min/{1.73_m2} (ref >59)

## 2022-03-25 LAB — HEMOGLOBIN A1C
Est. average glucose Bld gHb Est-mCnc: 174 mg/dL
Hgb A1c MFr Bld: 7.7 % — ABNORMAL HIGH (ref 4.8–5.6)

## 2022-03-25 NOTE — Telephone Encounter (Signed)
[facility] for therapy as requested. Ok for u/a if has symptoms, but we need to know what symptoms are.

## 2022-03-25 NOTE — Telephone Encounter (Signed)
I returned Jackie's call. She states that this has already been taken care of. She dropped off patient's urine sample yesterday. Results are not in yet.

## 2022-03-28 LAB — URINE CULTURE

## 2022-03-28 LAB — SPECIMEN STATUS REPORT

## 2022-03-31 ENCOUNTER — Other Ambulatory Visit: Payer: Self-pay

## 2022-03-31 MED ORDER — CEPHALEXIN 500 MG PO CAPS: 500.0000 mg | ORAL_CAPSULE | Freq: Four times a day (QID) | ORAL | 0 refills | Status: DC

## 2022-05-22 ENCOUNTER — Telehealth: Payer: Self-pay

## 2022-05-22 DIAGNOSIS — R350 Frequency of micturition: Secondary | ICD-10-CM

## 2022-05-22 NOTE — Addendum Note (Signed)
Addended by: Shawna Orleans on: 05/22/2022 02:59 PM   Modules accepted: Orders

## 2022-05-22 NOTE — Telephone Encounter (Signed)
Can we order a UA and urine culture and fax it to the facility?

## 2022-05-22 NOTE — Telephone Encounter (Signed)
Copied from [place] [phone]. Topic: General - Other >> May 22, 2022 12:41 PM Ja-Kwan M wrote: Reason for CRM: Pt daughter-in-law [name] reports that the pt living facility informed her that pt has been using the restroom frequently and the urine is a brownish color. Patty stated the facility collected a specimen and would like an order sent to the attention of Tammy at fax# [phone]

## 2022-05-22 NOTE — Telephone Encounter (Signed)
Orders faxed

## 2022-05-26 ENCOUNTER — Telehealth: Payer: Self-pay

## 2022-05-26 NOTE — Telephone Encounter (Signed)
Copied from [place] [phone]. Topic: General - Other >> May 26, 2022  2:11 PM Everette C wrote: Reason for CRM: The patient's daughter in law would like to know when they could bring a urine specimen for the patient to the practice   Please contact the patient's daughter in law further when possible.  Urine has been dropped off by facility employee.  Mrs. Patty requesting ABX. She reports that facility "dropped the ball" and did not get urine sample last week. She reports patient has been very disruptive at the facility. Mrs. [name] is very upset reporting that patient has been suffering from urinary infection since last week.

## 2022-05-26 NOTE — Telephone Encounter (Signed)
pt has been using the restroom frequently and the urine is a brownish color.

## 2022-05-28 LAB — URINE CULTURE

## 2022-05-28 LAB — SPECIMEN STATUS REPORT

## 2022-06-02 LAB — URINALYSIS

## 2022-06-02 LAB — SPECIMEN STATUS REPORT

## 2022-06-09 LAB — HM DIABETES EYE EXAM

## 2022-07-02 ENCOUNTER — Emergency Department: Payer: Medicare Other

## 2022-07-02 ENCOUNTER — Encounter: Payer: Self-pay | Admitting: Emergency Medicine

## 2022-07-02 DIAGNOSIS — Z7984 Long term (current) use of oral hypoglycemic drugs: Secondary | ICD-10-CM

## 2022-07-02 DIAGNOSIS — M810 Age-related osteoporosis without current pathological fracture: Secondary | ICD-10-CM | POA: Diagnosis present

## 2022-07-02 DIAGNOSIS — S72141A Displaced intertrochanteric fracture of right femur, initial encounter for closed fracture: Principal | ICD-10-CM | POA: Diagnosis present

## 2022-07-02 DIAGNOSIS — Z972 Presence of dental prosthetic device (complete) (partial): Secondary | ICD-10-CM

## 2022-07-02 DIAGNOSIS — Z1611 Resistance to penicillins: Secondary | ICD-10-CM | POA: Diagnosis present

## 2022-07-02 DIAGNOSIS — M720 Palmar fascial fibromatosis [Dupuytren]: Secondary | ICD-10-CM | POA: Diagnosis present

## 2022-07-02 DIAGNOSIS — Z90710 Acquired absence of both cervix and uterus: Secondary | ICD-10-CM

## 2022-07-02 DIAGNOSIS — B9620 Unspecified Escherichia coli [E. coli] as the cause of diseases classified elsewhere: Secondary | ICD-10-CM | POA: Diagnosis present

## 2022-07-02 DIAGNOSIS — W19XXXA Unspecified fall, initial encounter: Secondary | ICD-10-CM | POA: Diagnosis present

## 2022-07-02 DIAGNOSIS — Y92099 Unspecified place in other non-institutional residence as the place of occurrence of the external cause: Secondary | ICD-10-CM

## 2022-07-02 DIAGNOSIS — Z9842 Cataract extraction status, left eye: Secondary | ICD-10-CM

## 2022-07-02 DIAGNOSIS — Z8673 Personal history of transient ischemic attack (TIA), and cerebral infarction without residual deficits: Secondary | ICD-10-CM

## 2022-07-02 DIAGNOSIS — M25551 Pain in right hip: Secondary | ICD-10-CM | POA: Diagnosis not present

## 2022-07-02 DIAGNOSIS — F32A Depression, unspecified: Secondary | ICD-10-CM | POA: Diagnosis present

## 2022-07-02 DIAGNOSIS — E43 Unspecified severe protein-calorie malnutrition: Secondary | ICD-10-CM | POA: Diagnosis present

## 2022-07-02 DIAGNOSIS — Z6820 Body mass index (BMI) 20.0-20.9, adult: Secondary | ICD-10-CM

## 2022-07-02 DIAGNOSIS — Z79899 Other long term (current) drug therapy: Secondary | ICD-10-CM

## 2022-07-02 DIAGNOSIS — F0393 Unspecified dementia, unspecified severity, with mood disturbance: Secondary | ICD-10-CM | POA: Diagnosis present

## 2022-07-02 DIAGNOSIS — E785 Hyperlipidemia, unspecified: Secondary | ICD-10-CM | POA: Diagnosis present

## 2022-07-02 DIAGNOSIS — Z961 Presence of intraocular lens: Secondary | ICD-10-CM | POA: Diagnosis present

## 2022-07-02 DIAGNOSIS — E1165 Type 2 diabetes mellitus with hyperglycemia: Secondary | ICD-10-CM | POA: Diagnosis present

## 2022-07-02 DIAGNOSIS — D696 Thrombocytopenia, unspecified: Secondary | ICD-10-CM | POA: Diagnosis present

## 2022-07-02 DIAGNOSIS — Z9851 Tubal ligation status: Secondary | ICD-10-CM

## 2022-07-02 DIAGNOSIS — Z7982 Long term (current) use of aspirin: Secondary | ICD-10-CM

## 2022-07-02 DIAGNOSIS — N390 Urinary tract infection, site not specified: Secondary | ICD-10-CM | POA: Diagnosis present

## 2022-07-02 LAB — CBC
HCT: 37.3 % (ref 36.0–46.0)
Hemoglobin: 12.1 g/dL (ref 12.0–15.0)
MCH: 30.6 pg (ref 26.0–34.0)
MCHC: 32.4 g/dL (ref 30.0–36.0)
MCV: 94.2 fL (ref 80.0–100.0)
Platelets: 123 10*3/uL — ABNORMAL LOW (ref 150–400)
RBC: 3.96 MIL/uL (ref 3.87–5.11)
RDW: 12.7 % (ref 11.5–15.5)
WBC: 8.9 10*3/uL (ref 4.0–10.5)
nRBC: 0 % (ref 0.0–0.2)

## 2022-07-02 NOTE — ED Triage Notes (Signed)
Pt arrived via [facility] from [facility] assisted living facility post unwitnessed fall. Hx/o dementia. Per staff, pt found on floor in room, was stood up and assisted back to bed but not without pt yelling out and c/o right leg pain. Pt has obvious rotation and shortening to the right leg. Unknown LOC or if pt hit head. No obvious injury to face/head or neck on arrival to ED. EMS provided pt with 59mg Fentanyl  in route as well as 5038mNS. Pts CBG by EMS reported as 330. Per medication reconciliation paperwork from facility, last DM meds given at 1730.

## 2022-07-03 ENCOUNTER — Inpatient Hospital Stay: Payer: Medicare Other

## 2022-07-03 ENCOUNTER — Inpatient Hospital Stay: Payer: Medicare Other | Admitting: Anesthesiology

## 2022-07-03 ENCOUNTER — Other Ambulatory Visit: Payer: Self-pay

## 2022-07-03 ENCOUNTER — Encounter: Payer: Self-pay | Admitting: Family Medicine

## 2022-07-03 ENCOUNTER — Inpatient Hospital Stay
Admission: EM | Admit: 2022-07-03 | Discharge: 2022-07-08 | DRG: 480 | Disposition: A | Discharge status: Skilled Nursing Facility | Payer: Medicare Other | Source: Skilled Nursing Facility | Attending: Internal Medicine | Admitting: Internal Medicine

## 2022-07-03 ENCOUNTER — Encounter
Admission: EM | Disposition: A | Discharge status: Skilled Nursing Facility | Payer: Self-pay | Source: Skilled Nursing Facility | Attending: Internal Medicine

## 2022-07-03 DIAGNOSIS — M25551 Pain in right hip: Secondary | ICD-10-CM | POA: Diagnosis present

## 2022-07-03 DIAGNOSIS — E43 Unspecified severe protein-calorie malnutrition: Secondary | ICD-10-CM | POA: Diagnosis present

## 2022-07-03 DIAGNOSIS — F32A Depression, unspecified: Secondary | ICD-10-CM

## 2022-07-03 DIAGNOSIS — W19XXXA Unspecified fall, initial encounter: Secondary | ICD-10-CM | POA: Diagnosis present

## 2022-07-03 DIAGNOSIS — Y92099 Unspecified place in other non-institutional residence as the place of occurrence of the external cause: Secondary | ICD-10-CM | POA: Diagnosis not present

## 2022-07-03 DIAGNOSIS — S72141A Displaced intertrochanteric fracture of right femur, initial encounter for closed fracture: Secondary | ICD-10-CM | POA: Insufficient documentation

## 2022-07-03 DIAGNOSIS — B9620 Unspecified Escherichia coli [E. coli] as the cause of diseases classified elsewhere: Secondary | ICD-10-CM | POA: Diagnosis present

## 2022-07-03 DIAGNOSIS — E1165 Type 2 diabetes mellitus with hyperglycemia: Secondary | ICD-10-CM | POA: Diagnosis not present

## 2022-07-03 DIAGNOSIS — Z961 Presence of intraocular lens: Secondary | ICD-10-CM | POA: Diagnosis present

## 2022-07-03 DIAGNOSIS — Z9851 Tubal ligation status: Secondary | ICD-10-CM | POA: Diagnosis not present

## 2022-07-03 DIAGNOSIS — Z90710 Acquired absence of both cervix and uterus: Secondary | ICD-10-CM | POA: Diagnosis not present

## 2022-07-03 DIAGNOSIS — E785 Hyperlipidemia, unspecified: Secondary | ICD-10-CM | POA: Diagnosis present

## 2022-07-03 DIAGNOSIS — N390 Urinary tract infection, site not specified: Secondary | ICD-10-CM | POA: Diagnosis present

## 2022-07-03 DIAGNOSIS — S72001A Fracture of unspecified part of neck of right femur, initial encounter for closed fracture: Secondary | ICD-10-CM

## 2022-07-03 DIAGNOSIS — E119 Type 2 diabetes mellitus without complications: Secondary | ICD-10-CM | POA: Insufficient documentation

## 2022-07-03 DIAGNOSIS — Z8781 Personal history of (healed) traumatic fracture: Secondary | ICD-10-CM | POA: Diagnosis present

## 2022-07-03 DIAGNOSIS — Z8673 Personal history of transient ischemic attack (TIA), and cerebral infarction without residual deficits: Secondary | ICD-10-CM | POA: Diagnosis not present

## 2022-07-03 DIAGNOSIS — Z7982 Long term (current) use of aspirin: Secondary | ICD-10-CM | POA: Diagnosis not present

## 2022-07-03 DIAGNOSIS — F0393 Unspecified dementia, unspecified severity, with mood disturbance: Secondary | ICD-10-CM | POA: Diagnosis present

## 2022-07-03 DIAGNOSIS — Z7984 Long term (current) use of oral hypoglycemic drugs: Secondary | ICD-10-CM | POA: Diagnosis not present

## 2022-07-03 DIAGNOSIS — Z9842 Cataract extraction status, left eye: Secondary | ICD-10-CM | POA: Diagnosis not present

## 2022-07-03 DIAGNOSIS — M810 Age-related osteoporosis without current pathological fracture: Secondary | ICD-10-CM | POA: Diagnosis present

## 2022-07-03 DIAGNOSIS — Z972 Presence of dental prosthetic device (complete) (partial): Secondary | ICD-10-CM | POA: Diagnosis not present

## 2022-07-03 DIAGNOSIS — Z1611 Resistance to penicillins: Secondary | ICD-10-CM | POA: Diagnosis present

## 2022-07-03 DIAGNOSIS — Z6820 Body mass index (BMI) 20.0-20.9, adult: Secondary | ICD-10-CM | POA: Diagnosis not present

## 2022-07-03 DIAGNOSIS — Z79899 Other long term (current) drug therapy: Secondary | ICD-10-CM | POA: Diagnosis not present

## 2022-07-03 DIAGNOSIS — M720 Palmar fascial fibromatosis [Dupuytren]: Secondary | ICD-10-CM | POA: Diagnosis present

## 2022-07-03 DIAGNOSIS — D696 Thrombocytopenia, unspecified: Secondary | ICD-10-CM | POA: Diagnosis present

## 2022-07-03 HISTORY — PX: INTRAMEDULLARY (IM) NAIL INTERTROCHANTERIC: SHX5875

## 2022-07-03 LAB — BASIC METABOLIC PANEL
Anion gap: 8 (ref 5–15)
BUN: 13 mg/dL (ref 8–23)
CO2: 26 mmol/L (ref 22–32)
Calcium: 8.5 mg/dL — ABNORMAL LOW (ref 8.9–10.3)
Chloride: 101 mmol/L (ref 98–111)
Creatinine, Ser: 0.64 mg/dL (ref 0.44–1.00)
GFR, Estimated: >60 mL/min (ref >60)
Glucose, Bld: 294 mg/dL — ABNORMAL HIGH (ref 70–99)
Potassium: 4.1 mmol/L (ref 3.5–5.1)
Sodium: 135 mmol/L (ref 135–145)

## 2022-07-03 LAB — TROPONIN I (HIGH SENSITIVITY)
Troponin I (High Sensitivity): 5 ng/L (ref <18)
Troponin I (High Sensitivity): 6 ng/L (ref <18)

## 2022-07-03 LAB — URINALYSIS, ROUTINE W REFLEX MICROSCOPIC
Bilirubin Urine: NEGATIVE
Glucose, UA: >=500 mg/dL — AB
Hgb urine dipstick: NEGATIVE
Ketones, ur: 20 mg/dL — AB
Nitrite: POSITIVE — AB
Protein, ur: 30 mg/dL — AB
Specific Gravity, Urine: 1.016 (ref 1.005–1.030)
pH: 6 (ref 5.0–8.0)

## 2022-07-03 LAB — CBG MONITORING, ED
Glucose-Capillary: 228 mg/dL — ABNORMAL HIGH (ref 70–99)
Glucose-Capillary: 213 mg/dL — ABNORMAL HIGH (ref 70–99)
Glucose-Capillary: 214 mg/dL — ABNORMAL HIGH (ref 70–99)

## 2022-07-03 LAB — GLUCOSE, CAPILLARY
Glucose-Capillary: 245 mg/dL — ABNORMAL HIGH (ref 70–99)
Glucose-Capillary: 238 mg/dL — ABNORMAL HIGH (ref 70–99)

## 2022-07-03 SURGERY — FIXATION, FRACTURE, INTERTROCHANTERIC, WITH INTRAMEDULLARY ROD
Anesthesia: General | Laterality: Right

## 2022-07-03 MED ORDER — TRANEXAMIC ACID-NACL 1000-0.7 MG/100ML-% IV SOLN
1000.0000 mg | INTRAVENOUS | Status: AC
  Administered 2022-07-03: 1000 mg via INTRAVENOUS
  Filled 2022-07-03: qty 100

## 2022-07-03 MED ORDER — METOCLOPRAMIDE HCL 5 MG/ML IJ SOLN: 5.0000 mg | Freq: Three times a day (TID) | INTRAMUSCULAR | Status: DC | PRN

## 2022-07-03 MED ORDER — LACTATED RINGERS IV SOLN: INTRAVENOUS | Status: DC

## 2022-07-03 MED ORDER — ONDANSETRON HCL 4 MG/2ML IJ SOLN: 4.0000 mg | Freq: Four times a day (QID) | INTRAMUSCULAR | Status: DC | PRN

## 2022-07-03 MED ORDER — SERTRALINE HCL 50 MG PO TABS
50.0000 mg | ORAL_TABLET | Freq: Every day | ORAL | Status: DC
  Administered 2022-07-04 – 2022-07-08 (×5): 50 mg via ORAL
  Filled 2022-07-03 (×6): qty 1

## 2022-07-03 MED ORDER — ASPIRIN 81 MG PO CHEW
81.0000 mg | CHEWABLE_TABLET | Freq: Every day | ORAL | Status: DC
  Administered 2022-07-04 – 2022-07-08 (×5): 81 mg via ORAL
  Filled 2022-07-03 (×6): qty 1

## 2022-07-03 MED ORDER — MORPHINE SULFATE (PF) 2 MG/ML IV SOLN
2.0000 mg | INTRAVENOUS | Status: DC | PRN
  Administered 2022-07-03: 2 mg via INTRAVENOUS
  Filled 2022-07-03: qty 1

## 2022-07-03 MED ORDER — FENTANYL CITRATE (PF) 100 MCG/2ML IJ SOLN: 25.0000 ug | INTRAMUSCULAR | Status: DC | PRN

## 2022-07-03 MED ORDER — FENTANYL CITRATE (PF) 100 MCG/2ML IJ SOLN
INTRAMUSCULAR | Status: DC | PRN
  Administered 2022-07-03 (×2): 25 ug via INTRAVENOUS

## 2022-07-03 MED ORDER — ESMOLOL HCL 100 MG/10ML IV SOLN
INTRAVENOUS | Status: DC | PRN
  Administered 2022-07-03: 10 mg via INTRAVENOUS

## 2022-07-03 MED ORDER — ACETAMINOPHEN 10 MG/ML IV SOLN
INTRAVENOUS | Status: AC
  Filled 2022-07-03: qty 100

## 2022-07-03 MED ORDER — BUPIVACAINE-EPINEPHRINE (PF) 0.25% -1:200000 IJ SOLN
INTRAMUSCULAR | Status: AC
  Filled 2022-07-03: qty 30

## 2022-07-03 MED ORDER — OXYCODONE HCL 5 MG PO TABS: 5.0000 mg | ORAL_TABLET | Freq: Once | ORAL | Status: DC | PRN

## 2022-07-03 MED ORDER — SODIUM CHLORIDE 0.9 % IV SOLN
1.0000 g | Freq: Once | INTRAVENOUS | Status: AC
  Administered 2022-07-03: 1 g via INTRAVENOUS
  Filled 2022-07-03: qty 10

## 2022-07-03 MED ORDER — NIACIN ER (ANTIHYPERLIPIDEMIC) 500 MG PO TBCR
500.0000 mg | EXTENDED_RELEASE_TABLET | Freq: Every day | ORAL | Status: DC
  Administered 2022-07-03 – 2022-07-07 (×5): 500 mg via ORAL
  Filled 2022-07-03 (×5): qty 1

## 2022-07-03 MED ORDER — DOCUSATE SODIUM 100 MG PO CAPS
100.0000 mg | ORAL_CAPSULE | Freq: Two times a day (BID) | ORAL | Status: DC
  Administered 2022-07-03 – 2022-07-08 (×10): 100 mg via ORAL
  Filled 2022-07-03 (×11): qty 1

## 2022-07-03 MED ORDER — OXYCODONE HCL 5 MG/5ML PO SOLN: 5.0000 mg | Freq: Once | ORAL | Status: DC | PRN

## 2022-07-03 MED ORDER — MAGNESIUM HYDROXIDE 400 MG/5ML PO SUSP: 30.0000 mL | Freq: Every day | ORAL | Status: DC | PRN

## 2022-07-03 MED ORDER — TRANEXAMIC ACID-NACL 1000-0.7 MG/100ML-% IV SOLN
INTRAVENOUS | Status: AC
  Filled 2022-07-03: qty 100

## 2022-07-03 MED ORDER — ACETAMINOPHEN 10 MG/ML IV SOLN
INTRAVENOUS | Status: DC | PRN
  Administered 2022-07-03: 750 mg via INTRAVENOUS

## 2022-07-03 MED ORDER — LATANOPROST 0.005 % OP SOLN
1.0000 [drp] | Freq: Every day | OPHTHALMIC | Status: DC
  Administered 2022-07-03 – 2022-07-07 (×5): 1 [drp] via OPHTHALMIC
  Filled 2022-07-03: qty 2.5

## 2022-07-03 MED ORDER — ATORVASTATIN CALCIUM 20 MG PO TABS
40.0000 mg | ORAL_TABLET | Freq: Every day | ORAL | Status: DC
  Administered 2022-07-04 – 2022-07-08 (×5): 40 mg via ORAL
  Filled 2022-07-03 (×6): qty 2

## 2022-07-03 MED ORDER — MORPHINE SULFATE (PF) 2 MG/ML IV SOLN: 0.5000 mg | INTRAVENOUS | Status: DC | PRN

## 2022-07-03 MED ORDER — TRANEXAMIC ACID 1000 MG/10ML IV SOLN
INTRAVENOUS | Status: AC
  Filled 2022-07-03: qty 10

## 2022-07-03 MED ORDER — FENTANYL CITRATE PF 50 MCG/ML IJ SOSY
50.0000 ug | PREFILLED_SYRINGE | Freq: Once | INTRAMUSCULAR | Status: AC
  Administered 2022-07-03: 50 ug via INTRAVENOUS
  Filled 2022-07-03: qty 1

## 2022-07-03 MED ORDER — ROCURONIUM BROMIDE 100 MG/10ML IV SOLN
INTRAVENOUS | Status: DC | PRN
  Administered 2022-07-03: 10 mg via INTRAVENOUS
  Administered 2022-07-03: 30 mg via INTRAVENOUS

## 2022-07-03 MED ORDER — ONDANSETRON HCL 4 MG PO TABS: 4.0000 mg | ORAL_TABLET | Freq: Four times a day (QID) | ORAL | Status: DC | PRN

## 2022-07-03 MED ORDER — PHENYLEPHRINE HCL (PRESSORS) 10 MG/ML IV SOLN
INTRAVENOUS | Status: DC | PRN
  Administered 2022-07-03: 80 ug via INTRAVENOUS

## 2022-07-03 MED ORDER — LACTATED RINGERS IV SOLN: INTRAVENOUS | Status: DC | PRN

## 2022-07-03 MED ORDER — BUPIVACAINE-EPINEPHRINE (PF) 0.25% -1:200000 IJ SOLN
INTRAMUSCULAR | Status: DC | PRN
  Administered 2022-07-03: 30 mL via PERINEURAL

## 2022-07-03 MED ORDER — OYSTER SHELL CALCIUM/D3 500-5 MG-MCG PO TABS
1.0000 | ORAL_TABLET | Freq: Every day | ORAL | Status: DC
  Administered 2022-07-04 – 2022-07-08 (×5): 1 via ORAL
  Filled 2022-07-03 (×6): qty 1

## 2022-07-03 MED ORDER — INSULIN ASPART 100 UNIT/ML IJ SOLN
0.0000 [IU] | Freq: Four times a day (QID) | INTRAMUSCULAR | Status: DC
  Administered 2022-07-03 (×3): 3 [IU] via SUBCUTANEOUS
  Filled 2022-07-03 (×2): qty 1

## 2022-07-03 MED ORDER — ACETAMINOPHEN 10 MG/ML IV SOLN: 1000.0000 mg | Freq: Once | INTRAVENOUS | Status: DC | PRN

## 2022-07-03 MED ORDER — ONDANSETRON HCL 4 MG/2ML IJ SOLN: 4.0000 mg | Freq: Once | INTRAMUSCULAR | Status: DC | PRN

## 2022-07-03 MED ORDER — SODIUM CHLORIDE 0.9 % IV SOLN: INTRAVENOUS | Status: DC

## 2022-07-03 MED ORDER — LIDOCAINE HCL (CARDIAC) PF 100 MG/5ML IV SOSY
PREFILLED_SYRINGE | INTRAVENOUS | Status: DC | PRN
  Administered 2022-07-03: 50 mg via INTRAVENOUS

## 2022-07-03 MED ORDER — SODIUM CHLORIDE 0.9 % IV SOLN
1.0000 g | INTRAVENOUS | Status: DC
  Administered 2022-07-03 – 2022-07-05 (×3): 1 g via INTRAVENOUS
  Filled 2022-07-03: qty 10
  Filled 2022-07-03: qty 1
  Filled 2022-07-03 (×2): qty 10

## 2022-07-03 MED ORDER — METOCLOPRAMIDE HCL 5 MG PO TABS: 5.0000 mg | ORAL_TABLET | Freq: Three times a day (TID) | ORAL | Status: DC | PRN

## 2022-07-03 MED ORDER — HYDROCODONE-ACETAMINOPHEN 5-325 MG PO TABS
1.0000 | ORAL_TABLET | Freq: Four times a day (QID) | ORAL | Status: DC | PRN
  Administered 2022-07-04: 1 via ORAL
  Administered 2022-07-04: 2 via ORAL
  Administered 2022-07-05: 0.5 via ORAL
  Administered 2022-07-06 – 2022-07-07 (×2): 1 via ORAL
  Filled 2022-07-03 (×3): qty 1
  Filled 2022-07-03: qty 2
  Filled 2022-07-03: qty 1

## 2022-07-03 MED ORDER — ONDANSETRON HCL 4 MG/2ML IJ SOLN
INTRAMUSCULAR | Status: DC | PRN
  Administered 2022-07-03: 4 mg via INTRAVENOUS

## 2022-07-03 MED ORDER — SUGAMMADEX SODIUM 200 MG/2ML IV SOLN
INTRAVENOUS | Status: DC | PRN
  Administered 2022-07-03: 150 mg via INTRAVENOUS

## 2022-07-03 MED ORDER — PROPOFOL 10 MG/ML IV BOLUS
INTRAVENOUS | Status: DC | PRN
  Administered 2022-07-03: 30 mg via INTRAVENOUS
  Administered 2022-07-03: 70 mg via INTRAVENOUS

## 2022-07-03 MED ORDER — FENTANYL CITRATE (PF) 100 MCG/2ML IJ SOLN
INTRAMUSCULAR | Status: AC
  Filled 2022-07-03: qty 2

## 2022-07-03 MED ORDER — LORAZEPAM 2 MG/ML IJ SOLN
0.5000 mg | Freq: Once | INTRAMUSCULAR | Status: AC
  Administered 2022-07-03: 0.5 mg via INTRAVENOUS
  Filled 2022-07-03: qty 1

## 2022-07-03 MED ORDER — 0.9 % SODIUM CHLORIDE (POUR BTL) OPTIME
TOPICAL | Status: DC | PRN
  Administered 2022-07-03: 500 mL

## 2022-07-03 MED ORDER — ADULT MULTIVITAMIN W/MINERALS CH
1.0000 | ORAL_TABLET | Freq: Every day | ORAL | Status: DC
  Administered 2022-07-04 – 2022-07-08 (×5): 1 via ORAL
  Filled 2022-07-03 (×6): qty 1

## 2022-07-03 MED ORDER — ONDANSETRON HCL 4 MG/2ML IJ SOLN
4.0000 mg | Freq: Once | INTRAMUSCULAR | Status: AC
  Administered 2022-07-03: 4 mg via INTRAVENOUS
  Filled 2022-07-03: qty 2

## 2022-07-03 SURGICAL SUPPLY — 40 items
APL PRP STRL LF DISP 70% ISPRP (MISCELLANEOUS) ×1
BLADE HELICAL TFNA 90 (Anchor) IMPLANT
BNDG CMPR 5X4 CHSV STRCH STRL (GAUZE/BANDAGES/DRESSINGS)
BNDG COHESIVE 4X5 TAN STRL LF (GAUZE/BANDAGES/DRESSINGS) IMPLANT
BRUSH SCRUB EZ  4% CHG (MISCELLANEOUS) ×1
BRUSH SCRUB EZ 4% CHG (MISCELLANEOUS) ×2 IMPLANT
CHLORAPREP W/TINT 26 (MISCELLANEOUS) ×1 IMPLANT
DRAPE 3/4 80X56 (DRAPES) ×1 IMPLANT
DRAPE U-SHAPE 47X51 STRL (DRAPES) ×1 IMPLANT
DRSG AQUACEL AG ADV 3.5X 4 (GAUZE/BANDAGES/DRESSINGS) IMPLANT
DRSG AQUACEL AG ADV 3.5X10 (GAUZE/BANDAGES/DRESSINGS) ×1 IMPLANT
ELECT REM PT RETURN 9FT ADLT (ELECTROSURGICAL) ×1
ELECTRODE REM PT RTRN 9FT ADLT (ELECTROSURGICAL) ×1 IMPLANT
GAUZE XEROFORM 1X8 LF (GAUZE/BANDAGES/DRESSINGS) ×1 IMPLANT
GLOVE BIOGEL PI IND STRL 8.5 (GLOVE) ×1 IMPLANT
GLOVE SURG ORTHO 8.5 STRL (GLOVE) ×1 IMPLANT
GOWN STRL REUS W/ TWL LRG LVL3 (GOWN DISPOSABLE) ×1 IMPLANT
GOWN STRL REUS W/ TWL XL LVL3 (GOWN DISPOSABLE) ×1 IMPLANT
GOWN STRL REUS W/TWL LRG LVL3 (GOWN DISPOSABLE) ×1
GOWN STRL REUS W/TWL XL LVL3 (GOWN DISPOSABLE) ×1
GUIDEWIRE 3.2X400 (WIRE) IMPLANT
KIT PATIENT CARE HANA TABLE (KITS) ×1 IMPLANT
KIT TURNOVER CYSTO (KITS) ×1 IMPLANT
MANIFOLD NEPTUNE II (INSTRUMENTS) ×1 IMPLANT
MAT ABSORB  FLUID 56X50 GRAY (MISCELLANEOUS) ×1
MAT ABSORB FLUID 56X50 GRAY (MISCELLANEOUS) ×1 IMPLANT
NAIL CAN TFNA 9 130D 380 RT (Nail) IMPLANT
NDL SPNL 20GX3.5 QUINCKE YW (NEEDLE) ×1 IMPLANT
NEEDLE SPNL 20GX3.5 QUINCKE YW (NEEDLE) ×1 IMPLANT
NS IRRIG 1000ML POUR BTL (IV SOLUTION) ×1 IMPLANT
NS IRRIG 500ML POUR BTL (IV SOLUTION) IMPLANT
PACK HIP COMPR (MISCELLANEOUS) ×1 IMPLANT
STAPLER SKIN PROX 35W (STAPLE) ×1 IMPLANT
SUT VIC AB 0 CT1 36 (SUTURE) ×1 IMPLANT
SUT VIC AB 2-0 CT1 27 (SUTURE) ×1
SUT VIC AB 2-0 CT1 TAPERPNT 27 (SUTURE) ×2 IMPLANT
SYR 30ML LL (SYRINGE) ×1 IMPLANT
TOWEL OR 17X26 4PK STRL BLUE (TOWEL DISPOSABLE) ×1 IMPLANT
TRAP FLUID SMOKE EVACUATOR (MISCELLANEOUS) ×1 IMPLANT
WATER STERILE IRR 500ML POUR (IV SOLUTION) ×1 IMPLANT

## 2022-07-03 NOTE — Anesthesia Procedure Notes (Signed)
Procedure Name: Intubation Date/Time: 07/03/2022 3:33 PM  Performed by: Lowry Bowl, CRNAPre-anesthesia Checklist: Patient identified, Emergency Drugs available, Suction available and Patient being monitored Patient Re-evaluated:Patient Re-evaluated prior to induction Oxygen Delivery Method: Circle system utilized Preoxygenation: Pre-oxygenation with 100% oxygen Induction Type: IV induction Ventilation: Mask ventilation without difficulty Laryngoscope Size: 3 and McGraph Grade View: Grade I Tube type: Oral Tube size: 6.5 mm Number of attempts: 1 Airway Equipment and Method: Stylet and Video-laryngoscopy Placement Confirmation: ETT inserted through vocal cords under direct vision, positive ETCO2 and breath sounds checked- equal and bilateral Secured at: 21 cm Tube secured with: Tape Dental Injury: Teeth and Oropharynx as per pre-operative assessment

## 2022-07-03 NOTE — Progress Notes (Signed)
Progress Note   Patient: Terry Watson BUL:[id number] DOB: 15-Dec-1945 DOA: 07/03/2022     0 DOS: the patient was seen and examined on 07/03/2022   Brief hospital course: Taken from H&P.  Terry Watson is a 76 y.o. Caucasian female with medical history significant for type 2 diabetes mellitus, dementia, dyslipidemia and osteoporosis, who presented to the ER with acute onset of unwitnessed fall at her SNF with subsequent right hip pain when she was given an attempt to stand by the staff.  She was noted to have shortened right leg with external rotation.   ED course.  Vital stable. Labs revealed blood glucose of 294.  High sensitive troponin was 605.  CBC showed thrombocytopenia of 123 with otherwise unremarkable levels.  Blood group was A+ with NTE antibodies.  UA was positive for UTI. EKG as reviewed by me : Revealed normal sinus rhythm with a rate of 84 with Q waves septally.   Imaging: Portable chest ray showed no acute cardiopulmonary disease.  It did show aortic atherosclerosis.  Right hip x-ray showed comminuted intertrochanteric proximal right femoral fracture with the main distal fragment impacted and anteriorly displaced by about 300 of the shaft width.  It showed irregular sclerotic lesion in the proximal right femoral shaft probably a chondroid lesion or old bone infarct.  It also shows sclerotic metastasis were not strictly excluded.  Pelvic x-ray also showed left hip AVN and secondary DJD.   The patient was given fentanyl 50 mcg and Zofran 4 mg IV as well as 1 g of IV Rocephin and hydration with IV normal saline at 75 mill per hour. Dr. Harlow Mares from orthopedic surgery was consulted.  12/28: Blood pressure mildly elevated at 150/61.  Pending urine cultures and orthopedic consult. Going to OR later today.  Giving 1 dose of Ativan due to excessive anxiety    Assessment and Plan: * Closed right hip fracture ([place]) Secondary to unwitnessed fall at her facility. - Dr. Harlow Mares was  notified about the patient, going to the OR later today. - The patient has a history of CVA with no history of CHF, coronary artery disease,  with creatinine more than 2 or type II diabetes mellitus on insulin or CAD.  She is considered above average risk for age for perioperative cardiovascular events.  She has no current pulmonary issues. -Continue with pain management -Follow-up postsurgical recommendations  Type 2 diabetes mellitus with hyperglycemia, without long-term current use of insulin (HCC) - We will hold off home metformin. -SSI  Acute lower UTI - Continue with Rocephin for now -Follow-up urine culture results  Depression - We will continue Zoloft and trazodone.  Dyslipidemia - Statin therapy will be resumed.    Subjective: Patient was seen and examined today.  She appears very anxious and worried about her upcoming surgery.  Oriented to self and able to tell that this is not my place but unable to name it.  Physical Exam: Vitals:   07/02/22 2328 07/03/22 0638 07/03/22 0836 07/03/22 0955  BP: (!) 125/47 (!) 150/61  (!) 148/60  Pulse: 73 87  80  Resp: '15 16  16  '$ Temp: 98.6 F (37 C)   98 F (36.7 C)  TempSrc: Oral   Oral  SpO2: 98% 95%  96%  Weight:   53.1 kg   Height:   '5\' 3"'$  (1.6 m)    General.  Frail elderly lady, in no acute distress. Pulmonary.  Lungs clear bilaterally, normal respiratory effort. CV.  Regular rate  and rhythm, no JVD, rub or murmur. Abdomen.  Soft, nontender, nondistended, BS positive. CNS.  Alert and oriented .  No focal neurologic deficit. Extremities.  No edema,, pulses intact and symmetrical.  RLE externally rotated. Psychiatry.  Appears to have some cognitive impairment  Data Reviewed: Prior data reviewed  Family Communication: Talked with son on phone.  Disposition: Status is: Inpatient Remains inpatient appropriate because: Severity of illness  Planned Discharge Destination: Skilled nursing facility  Time spent: 40  minutes  This record has been created using Systems analyst. Errors have been sought and corrected,but may not always be located. Such creation errors do not reflect on the standard of care.   Author: Lorella Nimrod, MD 07/03/2022 2:18 PM  For on call review [email or web address].

## 2022-07-03 NOTE — Anesthesia Preprocedure Evaluation (Addendum)
Anesthesia Evaluation  Patient identified by MRN, date of birth, ID band Patient confused    Reviewed: Allergy & Precautions, NPO status , Patient's Chart, lab work & pertinent test results  History of Anesthesia Complications Negative for: history of anesthetic complications  Airway Mallampati: III   Neck ROM: Full    Dental  (+) Upper Dentures   Pulmonary neg pulmonary ROS   Pulmonary exam normal breath sounds clear to auscultation       Cardiovascular Normal cardiovascular exam Rhythm:Regular Rate:Normal  ECG 07/02/22:  Normal sinus rhythm Septal infarct , age undetermined  Echo 11/25/19:  NORMAL LEFT VENTRICULAR SYSTOLIC FUNCTION   WITH MILD LVH  NORMAL RIGHT VENTRICULAR SYSTOLIC FUNCTION  MILD VALVULAR REGURGITATION  NO VALVULAR STENOSIS  MILD MR, TR, PR  TRIVIAL AR  EF 55-60%  Closest EF: >55% (Estimated)  LVH: MILD LVH  Aortic: TRIVIAL AR  Mitral: MILD MR  Tricuspid: MILD TR    Neuro/Psych  PSYCHIATRIC DISORDERS Anxiety Depression   Dementia At baseline, patient is oriented to self only CVA (old infarcts found on 2021 brain MRI)    GI/Hepatic negative GI ROS,,,  Endo/Other  diabetes, Type 2    Renal/GU negative Renal ROS     Musculoskeletal   Abdominal   Peds  Hematology negative hematology ROS (+)   Anesthesia Other Findings   Reproductive/Obstetrics                             Anesthesia Physical Anesthesia Plan  ASA: 3  Anesthesia Plan: General   Post-op Pain Management:    Induction: Intravenous  PONV Risk Score and Plan: 3 and Ondansetron, Dexamethasone and Treatment may vary due to age or medical condition  Airway Management Planned: Oral ETT  Additional Equipment:   Intra-op Plan:   Post-operative Plan: Extubation in OR  Informed Consent: I have reviewed the patients History and Physical, chart, labs and discussed the procedure including the risks,  benefits and alternatives for the proposed anesthesia with the patient or authorized representative who has indicated his/her understanding and acceptance.     Dental advisory given and Consent reviewed with POA  Plan Discussed with: CRNA  Anesthesia Plan Comments: (Patient's daughter-in-law and POA, Gelisa Tieken, at bedside consented for risks of anesthesia including but not limited to:  - adverse reactions to medications - damage to eyes, teeth, lips or other oral mucosa - nerve damage due to positioning  - sore throat or hoarseness - damage to heart, brain, nerves, lungs, other parts of body or loss of life  Informed patient's daughter-in-law about role of CRNA in peri- and intra-operative care; she voiced understanding.)        Anesthesia Quick Evaluation

## 2022-07-03 NOTE — Assessment & Plan Note (Addendum)
Secondary to unwitnessed fall at her facility. - Dr. Harlow Mares was notified about the patient, going to the OR later today. - The patient has a history of CVA with no history of CHF, coronary artery disease,  with creatinine more than 2 or type II diabetes mellitus on insulin or CAD.  She is considered above average risk for age for perioperative cardiovascular events.  She has no current pulmonary issues. -Continue with pain management -Follow-up postsurgical recommendations
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
## 2022-07-03 NOTE — Hospital Course (Addendum)
Taken from H&P.  Terry Watson is a 76 y.o. Caucasian female with medical history significant for type 2 diabetes mellitus, dementia, dyslipidemia and osteoporosis, who presented to the ER with acute onset of unwitnessed fall at her SNF with subsequent right hip pain when she was given an attempt to stand by the staff.  She was noted to have shortened right leg with external rotation.   ED course.  Vital stable. Labs revealed blood glucose of 294.  High sensitive troponin was 605.  CBC showed thrombocytopenia of 123 with otherwise unremarkable levels.  Blood group was A+ with NTE antibodies.  UA was positive for UTI. EKG as reviewed by me : Revealed normal sinus rhythm with a rate of 84 with Q waves septally.   Imaging: Portable chest ray showed no acute cardiopulmonary disease.  It did show aortic atherosclerosis.  Right hip x-ray showed comminuted intertrochanteric proximal right femoral fracture with the main distal fragment impacted and anteriorly displaced by about 300 of the shaft width.  It showed irregular sclerotic lesion in the proximal right femoral shaft probably a chondroid lesion or old bone infarct.  It also shows sclerotic metastasis were not strictly excluded.  Pelvic x-ray also showed left hip AVN and secondary DJD.   The patient was given fentanyl 50 mcg and Zofran 4 mg IV as well as 1 g of IV Rocephin and hydration with IV normal saline at 75 mill per hour. Dr. Harlow Mares from orthopedic surgery was consulted.  12/28: Blood pressure mildly elevated at 150/61.  Pending urine cultures and orthopedic consult. Going to OR later today.  Giving 1 dose of Ativan due to excessive anxiety.  12/29: Vital stable.  S/p ORIF yesterday.  Tolerated the procedure well  CBG elevated above 200, checking A1c and switching her to SSI with meals, add 3 units with meal and Semglee 5 units twice daily.  Mild pseudo hyponatremia secondary to hyperglycemia.  Hemoglobin decreased to 11.2 after the surgery.   Starting her on iron supplement. She desaturated requiring up to 2 L of oxygen while working with PT, no baseline oxygen use. PT and OT are recommending SNF  12/30: Hemodynamically stable.  Patient again became hypoxic to high 80s with ambulation, mild need to go to SNF with some oxygen.  Urine cultures with E. coli-pending susceptibility.  CBG mildly elevated.
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
## 2022-07-03 NOTE — Assessment & Plan Note (Signed)
-   Statin therapy will be resumed.

## 2022-07-03 NOTE — TOC CM/SW Note (Signed)
Per chart review, patient admitted from [facility]. Left voicemail for staff member, Lynelle Smoke, to determine if patient resides on ALF or memory care side and if she was getting any home health or using DME prior to admission.  Dayton Scrape, [place]

## 2022-07-03 NOTE — Assessment & Plan Note (Addendum)
-   We will hold off home metformin. -SSI

## 2022-07-03 NOTE — Op Note (Signed)
DATE OF SURGERY:  07/03/2022  TIME: 4:38 PM  PATIENT NAME:  Terry Watson  AGE: 76 y.o.  PRE-OPERATIVE DIAGNOSIS:  Right hip fracture  POST-OPERATIVE DIAGNOSIS:  SAME  PROCEDURE:  RIGHT INTRAMEDULLARY (IM) NAIL INTERTROCHANTERIC  SURGEON:  Lovell Sheehan  EBL:  737 cc  COMPLICATIONS:  NONE APPARENT  OPERATIVE IMPLANTS: Synthes trochanteric femoral nail  380 mm by 9 mm  with interlocking helical blade  90 mm  PREOPERATIVE INDICATIONS:  Terry Watson is a 76 y.o. year old who fell and suffered a hip fracture. She was brought into the ER and then admitted and optimized and then elected for surgical intervention.    The risks benefits and alternatives were discussed with the patient including but not limited to the risks of nonoperative treatment, versus surgical intervention including infection, bleeding, nerve injury, malunion, nonunion, hardware prominence, hardware failure, need for hardware removal, blood clots, cardiopulmonary complications, morbidity, mortality, among others, and they were willing to proceed.    OPERATIVE PROCEDURE:  The patient was brought to the operating room and placed in the supine position.  General anesthesia was administered. She was placed on the fracture table.  Closed reduction was performed under C-arm guidance. The length of the femur was also measured using fluoroscopy. Time out was then performed after sterile prep and drape. She received preoperative antibiotics.  Incision was made proximal to the greater trochanter. A guidewire was placed in the appropriate position. Confirmation was made on AP and lateral views. The above-named nail was opened. I opened the proximal femur with a reamer. I then placed the nail by hand easily down. I did not need to ream the femur.  Once the nail was completely seated, I placed a guidepin into the femoral head into the center center position through a second incision.  I measured the length, and then reamed the  lateral cortex and up into the head. I then placed the helical blade. Slight compression was applied. Anatomic fixation achieved. Bone quality was poor.  I then secured the proximal interlock.  I then removed the instruments, and took final C-arm pictures AP and lateral the entire length of the leg. Anatomic reconstruction was achieved, and the wounds were irrigated copiously and closed with Vicryl  followed by staples and dry sterile dressing. Sponge and needle count were correct.   The patient was awakened and returned to PACU in stable and satisfactory condition. There no complications and the patient tolerated the procedure well.  She will be weightbearing as tolerated.    Lovell Sheehan

## 2022-07-03 NOTE — Transfer of Care (Signed)
Immediate Anesthesia Transfer of Care Note  Patient: Terry Watson  Procedure(s) Performed: INTRAMEDULLARY (IM) NAIL INTERTROCHANTERIC (Right)  Patient Location: PACU  Anesthesia Type:General  Level of Consciousness: drowsy and patient cooperative  Airway & Oxygen Therapy: Patient Spontanous Breathing and Patient connected to face mask oxygen  Post-op Assessment: Report given to RN and Post -op Vital signs reviewed and stable  Post vital signs: Reviewed and stable  Last Vitals:  Vitals Value Taken Time  BP 116/53 07/03/22 1640  Temp    Pulse 67 07/03/22 1642  Resp 17 07/03/22 1645  SpO2 100 % 07/03/22 1642  Vitals shown include unvalidated device data.  Last Pain:  Vitals:   07/03/22 1424  TempSrc: Temporal         Complications: No notable events documented.

## 2022-07-03 NOTE — Assessment & Plan Note (Addendum)
-   Continue with Rocephin for now -Follow-up urine culture results

## 2022-07-03 NOTE — Assessment & Plan Note (Deleted)
-   The patient will be placed on supplement coverage with NovoLog. - We will hold off metformin. 

## 2022-07-03 NOTE — Anesthesia Postprocedure Evaluation (Signed)
Anesthesia Post Note  Patient: Terry Watson  Procedure(s) Performed: INTRAMEDULLARY (IM) NAIL INTERTROCHANTERIC (Right)  Patient location during evaluation: PACU Anesthesia Type: General Level of consciousness: awake Pain management: pain level controlled Vital Signs Assessment: post-procedure vital signs reviewed and stable Respiratory status: spontaneous breathing, nonlabored ventilation, respiratory function stable and patient connected to nasal cannula oxygen Cardiovascular status: blood pressure returned to baseline and stable Postop Assessment: no apparent nausea or vomiting Anesthetic complications: no   No notable events documented.   Last Vitals:  Vitals:   07/03/22 1744 07/03/22 2001  BP: (!) 141/55 (!) 134/52  Pulse: 71 73  Resp: 20 14  Temp: 36.8 C 36.6 C  SpO2: 97% 100%    Last Pain:  Vitals:   07/03/22 1720  TempSrc:   PainSc: 0-No pain                 Ilene Qua
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
## 2022-07-03 NOTE — Assessment & Plan Note (Signed)
-   We will continue Zoloft and trazodone.

## 2022-07-04 ENCOUNTER — Encounter: Payer: Self-pay | Admitting: Orthopedic Surgery

## 2022-07-04 DIAGNOSIS — S72001A Fracture of unspecified part of neck of right femur, initial encounter for closed fracture: Secondary | ICD-10-CM | POA: Diagnosis not present

## 2022-07-04 DIAGNOSIS — E43 Unspecified severe protein-calorie malnutrition: Secondary | ICD-10-CM | POA: Diagnosis present

## 2022-07-04 LAB — BASIC METABOLIC PANEL
Anion gap: 8 (ref 5–15)
BUN: 9 mg/dL (ref 8–23)
CO2: 23 mmol/L (ref 22–32)
Calcium: 8.1 mg/dL — ABNORMAL LOW (ref 8.9–10.3)
Chloride: 103 mmol/L (ref 98–111)
Creatinine, Ser: 0.5 mg/dL (ref 0.44–1.00)
GFR, Estimated: >60 mL/min (ref >60)
Glucose, Bld: 239 mg/dL — ABNORMAL HIGH (ref 70–99)
Potassium: 4.1 mmol/L (ref 3.5–5.1)
Sodium: 134 mmol/L — ABNORMAL LOW (ref 135–145)

## 2022-07-04 LAB — GLUCOSE, CAPILLARY
Glucose-Capillary: 217 mg/dL — ABNORMAL HIGH (ref 70–99)
Glucose-Capillary: 319 mg/dL — ABNORMAL HIGH (ref 70–99)
Glucose-Capillary: 180 mg/dL — ABNORMAL HIGH (ref 70–99)
Glucose-Capillary: 227 mg/dL — ABNORMAL HIGH (ref 70–99)

## 2022-07-04 LAB — CBC
HCT: 34.3 % — ABNORMAL LOW (ref 36.0–46.0)
Hemoglobin: 11.2 g/dL — ABNORMAL LOW (ref 12.0–15.0)
MCH: 30.2 pg (ref 26.0–34.0)
MCHC: 32.7 g/dL (ref 30.0–36.0)
MCV: 92.5 fL (ref 80.0–100.0)
Platelets: 129 10*3/uL — ABNORMAL LOW (ref 150–400)
RBC: 3.71 MIL/uL — ABNORMAL LOW (ref 3.87–5.11)
RDW: 12.9 % (ref 11.5–15.5)
WBC: 8.2 10*3/uL (ref 4.0–10.5)
nRBC: 0 % (ref 0.0–0.2)

## 2022-07-04 LAB — ABO/RH: ABO/RH(D): A POS

## 2022-07-04 MED ORDER — INSULIN ASPART 100 UNIT/ML IJ SOLN
0.0000 [IU] | Freq: Three times a day (TID) | INTRAMUSCULAR | Status: DC
  Administered 2022-07-04: 7 [IU] via SUBCUTANEOUS
  Administered 2022-07-04: 2 [IU] via SUBCUTANEOUS
  Administered 2022-07-05: 9 [IU] via SUBCUTANEOUS
  Administered 2022-07-05: 5 [IU] via SUBCUTANEOUS
  Administered 2022-07-05: 2 [IU] via SUBCUTANEOUS
  Administered 2022-07-06 (×2): 5 [IU] via SUBCUTANEOUS
  Administered 2022-07-06: 3 [IU] via SUBCUTANEOUS
  Administered 2022-07-07: 9 [IU] via SUBCUTANEOUS
  Administered 2022-07-07: 7 [IU] via SUBCUTANEOUS
  Administered 2022-07-08: 5 [IU] via SUBCUTANEOUS
  Administered 2022-07-08: 15 [IU] via SUBCUTANEOUS
  Filled 2022-07-04 (×13): qty 1

## 2022-07-04 MED ORDER — ENSURE ENLIVE PO LIQD
237.0000 mL | Freq: Two times a day (BID) | ORAL | Status: DC
  Administered 2022-07-04 – 2022-07-08 (×8): 237 mL via ORAL

## 2022-07-04 MED ORDER — INSULIN GLARGINE-YFGN 100 UNIT/ML ~~LOC~~ SOLN
5.0000 [IU] | Freq: Two times a day (BID) | SUBCUTANEOUS | Status: DC
  Administered 2022-07-04 – 2022-07-05 (×4): 5 [IU] via SUBCUTANEOUS
  Filled 2022-07-04 (×5): qty 0.05

## 2022-07-04 MED ORDER — FE FUM-VIT C-VIT B12-FA 460-60-0.01-1 MG PO CAPS
1.0000 | ORAL_CAPSULE | Freq: Every day | ORAL | Status: DC
  Administered 2022-07-04 – 2022-07-08 (×5): 1 via ORAL
  Filled 2022-07-04 (×5): qty 1

## 2022-07-04 MED ORDER — HYDROCODONE-ACETAMINOPHEN 5-325 MG PO TABS: 1.0000 | ORAL_TABLET | Freq: Four times a day (QID) | ORAL | 0 refills | Status: AC | PRN

## 2022-07-04 MED ORDER — ASPIRIN 81 MG PO CHEW: 81.0000 mg | CHEWABLE_TABLET | Freq: Every day | ORAL | 0 refills | Status: AC

## 2022-07-04 MED ORDER — INSULIN ASPART 100 UNIT/ML IJ SOLN
3.0000 [IU] | Freq: Three times a day (TID) | INTRAMUSCULAR | Status: DC
  Administered 2022-07-04 – 2022-07-06 (×6): 3 [IU] via SUBCUTANEOUS
  Filled 2022-07-04 (×6): qty 1

## 2022-07-04 MED ORDER — INSULIN ASPART 100 UNIT/ML IJ SOLN
0.0000 [IU] | Freq: Every day | INTRAMUSCULAR | Status: DC
  Administered 2022-07-04 – 2022-07-05 (×2): 2 [IU] via SUBCUTANEOUS
  Administered 2022-07-07: 3 [IU] via SUBCUTANEOUS
  Filled 2022-07-04 (×3): qty 1

## 2022-07-04 NOTE — Progress Notes (Signed)
Progress Note   Patient: Terry Watson MWN:[id number] DOB: Jul 06, 1946 DOA: 07/03/2022     1 DOS: the patient was seen and examined on 07/04/2022   Brief hospital course: Taken from H&P.  Terry Watson is a 76 y.o. Caucasian female with medical history significant for type 2 diabetes mellitus, dementia, dyslipidemia and osteoporosis, who presented to the ER with acute onset of unwitnessed fall at her SNF with subsequent right hip pain when she was given an attempt to stand by the staff.  She was noted to have shortened right leg with external rotation.   ED course.  Vital stable. Labs revealed blood glucose of 294.  High sensitive troponin was 605.  CBC showed thrombocytopenia of 123 with otherwise unremarkable levels.  Blood group was A+ with NTE antibodies.  UA was positive for UTI. EKG as reviewed by me : Revealed normal sinus rhythm with a rate of 84 with Q waves septally.   Imaging: Portable chest ray showed no acute cardiopulmonary disease.  It did show aortic atherosclerosis.  Right hip x-ray showed comminuted intertrochanteric proximal right femoral fracture with the main distal fragment impacted and anteriorly displaced by about 300 of the shaft width.  It showed irregular sclerotic lesion in the proximal right femoral shaft probably a chondroid lesion or old bone infarct.  It also shows sclerotic metastasis were not strictly excluded.  Pelvic x-ray also showed left hip AVN and secondary DJD.   The patient was given fentanyl 50 mcg and Zofran 4 mg IV as well as 1 g of IV Rocephin and hydration with IV normal saline at 75 mill per hour. Dr. Harlow Mares from orthopedic surgery was consulted.  12/28: Blood pressure mildly elevated at 150/61.  Pending urine cultures and orthopedic consult. Going to OR later today.  Giving 1 dose of Ativan due to excessive anxiety.  12/29: Vital stable.  S/p ORIF yesterday.  Tolerated the procedure well  CBG elevated above 200, checking A1c and switching her  to SSI with meals, add 3 units with meal and Semglee 5 units twice daily.  Mild pseudo hyponatremia secondary to hyperglycemia.  Hemoglobin decreased to 11.2 after the surgery.  Starting her on iron supplement. She desaturated requiring up to 2 L of oxygen while working with PT, no baseline oxygen use. PT and OT are recommending SNF    Assessment and Plan: * Closed right hip fracture ([place]) Secondary to unwitnessed fall at her facility, s/p ORIF. -Continue with pain management -PT/OT are recommending SNF  Type 2 diabetes mellitus with hyperglycemia, without long-term current use of insulin (HCC) CBG elevated today, she was on metformin at home which is being currently held - Recheck A1c -Add low-dose Semglee -Add 3 units with meal if he eats more than 50% -SSI  Acute lower UTI Urine cultures with more than 100,000 colonies of gram-negative rods - Continue with Rocephin for now  Depression - We will continue Zoloft and trazodone.  Dyslipidemia - Statin therapy will be resumed.  Protein-calorie malnutrition, severe Estimated body mass index is 20.74 kg/m as calculated from the following:   Height as of this encounter: '5\' 3"'$  (1.6 m).   Weight as of this encounter: 53.1 kg.   -Dietitian consult    Subjective: Patient was seen and examined today.  Denies any pain.  She did desaturate while working with PT and was placed on 2 L of oxygen.  No baseline oxygen use.  Physical Exam: Vitals:   07/03/22 1744 07/03/22 2001 07/03/22 2350 07/04/22  0903  BP: (!) 141/55 (!) 134/52 (!) 124/52 (!) 122/45  Pulse: 71 73 74 85  Resp: '20 14 16 16  '$ Temp: 98.3 F (36.8 C) 97.9 F (36.6 C) 98.9 F (37.2 C) 98.8 F (37.1 C)  TempSrc:   Oral   SpO2: 97% 100% 100% 91%  Weight:      Height:       General.  Frail and malnourished elderly lady, in no acute distress. Pulmonary.  Lungs clear bilaterally, normal respiratory effort. CV.  Regular rate and rhythm, no JVD, rub or murmur. Abdomen.   Soft, nontender, nondistended, BS positive. CNS.  Alert and oriented to self.  No focal neurologic deficit. Extremities.  No edema, no cyanosis, pulses intact and symmetrical. Psychiatry.  Judgment and insight appears impaired  Data Reviewed: Prior data reviewed  Family Communication: Discussed with daughter-in-law at bedside  Disposition: Status is: Inpatient Remains inpatient appropriate because: Severity of illness  Planned Discharge Destination: Skilled nursing facility  Time spent: 40 minutes  This record has been created using Systems analyst. Errors have been sought and corrected,but may not always be located. Such creation errors do not reflect on the standard of care.   Author: Lorella Nimrod, MD 07/04/2022 3:19 PM  For on call review [email or web address].

## 2022-07-04 NOTE — Progress Notes (Signed)
Physical Therapy Treatment Patient Details Name: Terry Watson MRN: [id number] DOB: October 08, 1945 Today's Date: 07/04/2022   History of Present Illness Terry Watson is a 76 y.o. Caucasian female with medical history significant for type 2 diabetes mellitus, dementia, dyslipidemia and osteoporosis, who presented to the ER with acute onset of unwitnessed fall at her SNF with subsequent right hip pain when she was given an attempt to stand by the staff. Pt s/p R hip IM nailing 07/03/22.    PT Comments    PT /OT co treat 2/2 to pt requiring +2 assist and current level of arousal. Pt was given pain meds ~ 2 hours prior but is extremely lethargic throughout. She did exit bed and stand 1 x with constant encouragement to participate. Unsafe to advance away from EOB 2/2 to lethargy. SNF most appropriate DC disposition until pt is safe to return to ALF.       Recommendations for follow up therapy are one component of a multi-disciplinary discharge planning process, led by the attending physician.  Recommendations may be updated based on patient status, additional functional criteria and insurance authorization.  Follow Up Recommendations  Skilled nursing-short term rehab (<3 hours/day)     Assistance Recommended at Discharge Frequent or constant Supervision/Assistance  Patient can return home with the following A lot of help with bathing/dressing/bathroom;Two people to help with walking and/or transfers;Assist for transportation;Help with stairs or ramp for entrance;A lot of help with walking and/or transfers   Equipment Recommendations  Other (comment) (defer to next level of care)       Precautions / Restrictions Precautions Precautions: Fall Restrictions Weight Bearing Restrictions: Yes RLE Weight Bearing: Weight bearing as tolerated     Mobility  Bed Mobility Overal bed mobility: Needs Assistance Bed Mobility: Supine to Sit, Sit to Supine  Supine to sit: +2 for physical assistance,  +2 for safety/equipment, Max assist, Total assist, HOB elevated Sit to supine: Total assist, +2 for physical assistance, +2 for safety/equipment     Transfers Overall transfer level: Needs assistance Equipment used: Rolling walker (2 wheels) Transfers: Sit to/from Stand Sit to Stand: From elevated surface, Max assist, +2 physical assistance, +2 safety/equipment      General transfer comment: +2 assistance mostly due to lethargy.    Ambulation/Gait        General Gait Details: did not progress away form EOB 2/2 to lethargy and cognition concerns   Balance Overall balance assessment: Needs assistance Sitting-balance support: Bilateral upper extremity supported, Feet supported Sitting balance-Leahy Scale: Poor     Standing balance support: Bilateral upper extremity supported, During functional activity, Reliant on assistive device for balance Standing balance-Leahy Scale: Poor Standing balance comment: poor standing 2/2 to alertness/lethargy       Cognition Arousal/Alertness: Lethargic, Suspect due to medications Behavior During Therapy: Flat affect Overall Cognitive Status: History of cognitive impairments - at baseline        General Comments: pt required sternal rub to wake enough to participate. Didnt fully wake up until total assisted to EOB with +2 max assist.           Home Living Family/patient expects to be discharged to:: Skilled nursing facility  Home Equipment: Standard Walker          PT Goals (current goals can now be found in the care plan section) Acute Rehab PT Goals Patient Stated Goal: none stated Progress towards PT goals: Progressing toward goals    Frequency    BID  PT Plan Current plan remains appropriate    Co-evaluation   Reason for Co-Treatment: Complexity of the patient's impairments (multi-system involvement);For patient/therapist safety;To address functional/ADL transfers PT goals addressed during session:  Mobility/safety with mobility OT goals addressed during session: ADL's and self-care      AM-PAC PT "6 Clicks" Mobility   Outcome Measure  Help needed turning from your back to your side while in a flat bed without using bedrails?: Total Help needed moving from lying on your back to sitting on the side of a flat bed without using bedrails?: Total Help needed moving to and from a bed to a chair (including a wheelchair)?: Total Help needed standing up from a chair using your arms (e.g., wheelchair or bedside chair)?: Total Help needed to walk in hospital room?: Total Help needed climbing 3-5 steps with a railing? : Total 6 Click Score: 6    End of Session Equipment Utilized During Treatment: Oxygen Activity Tolerance: Patient limited by lethargy Patient left: in bed;with call bell/phone within reach;with bed alarm set;with nursing/sitter in room;with family/visitor present Nurse Communication: Mobility status PT Visit Diagnosis: Unsteadiness on feet (R26.81);Other abnormalities of gait and mobility (R26.89);Difficulty in walking, not elsewhere classified (R26.2);Muscle weakness (generalized) (M62.81)     Time: 9179-1505 PT Time Calculation (min) (ACUTE ONLY): 21 min  Charges:  $Therapeutic Activity: 8-22 mins                    Julaine Fusi PTA 07/04/22, 3:53 PM

## 2022-07-04 NOTE — Assessment & Plan Note (Addendum)
Urine cultures with E. coli-pending susceptibility - Continue with Rocephin for now

## 2022-07-04 NOTE — TOC Progression Note (Addendum)
Transition of Care [facility]) - Progression Note    Patient Details  Name: Terry Watson MRN: [id number] Date of Birth: 1945-12-20  [facility] CM/SW [place], LCSW Phone Number: 07/04/2022, 12:39 PM  Clinical Narrative:    Attempted call to daughter in law Patty to discuss SNF recommendation. Left VM requesting return call.   1:30- Spoke with daughter in law Patty who is agreeable to SNF, prefers [facility]. States she does not want [facility] or [employer]. SNF work up started.     Barriers to Discharge: Continued Medical Work up  Expected Discharge Plan and Services       Living arrangements for the past 2 months: [place]                                       Social Determinants of Health (SDOH) Interventions SDOH Screenings   Food Insecurity: No Food Insecurity (07/03/2022)  Housing: Low Risk  (07/03/2022)  Transportation Needs: No Transportation Needs (01/21/2022)  Utilities: Not At Risk (07/03/2022)  Alcohol Screen: Low Risk  (01/21/2022)  Depression (PHQ2-9): Medium Risk (02/05/2022)  Financial Resource Strain: Low Risk  (01/21/2022)  Physical Activity: Insufficiently Active (01/21/2022)  Social Connections: Moderately Isolated (01/21/2022)  Stress: No Stress Concern Present (01/21/2022)  Tobacco Use: Low Risk  (07/04/2022)    Readmission Risk Interventions     No data to display

## 2022-07-04 NOTE — Assessment & Plan Note (Signed)
Estimated body mass index is 20.74 kg/m as calculated from the following:   Height as of this encounter: '5\' 3"'$  (1.6 m).   Weight as of this encounter: 53.1 kg.   -Dietitian consult

## 2022-07-04 NOTE — Progress Notes (Addendum)
Subjective:  Patient reports pain as mild to moderate.    Objective:   VITALS:   Vitals:   07/03/22 1725 07/03/22 1744 07/03/22 2001 07/03/22 2350  BP: (!) 141/56 (!) 141/55 (!) 134/52 (!) 124/52  Pulse: 68 71 73 74  Resp: '19 20 14 16  '$ Temp:  98.3 F (36.8 C) 97.9 F (36.6 C) 98.9 F (37.2 C)  TempSrc:    Oral  SpO2: 97% 97% 100% 100%  Weight:      Height:        PHYSICAL EXAM:  Neurologically intact ABD soft Neurovascular intact Sensation intact distally Intact pulses distally Dorsiflexion/Plantar flexion intact Incision: dressing C/D/I No cellulitis present Compartment soft  LABS  Results for orders placed or performed during the hospital encounter of 07/03/22 (from the past 24 hour(s))  CBG monitoring, ED     Status: Abnormal   Collection Time: 07/03/22 11:09 AM  Result Value Ref Range   Glucose-Capillary 228 (H) 70 - 99 mg/dL  CBG monitoring, ED     Status: Abnormal   Collection Time: 07/03/22  2:13 PM  Result Value Ref Range   Glucose-Capillary 213 (H) 70 - 99 mg/dL  CBG monitoring, ED     Status: Abnormal   Collection Time: 07/03/22  4:41 PM  Result Value Ref Range   Glucose-Capillary 214 (H) 70 - 99 mg/dL   Comment 1 Notify RN    Comment 2 Document in Chart   Glucose, capillary     Status: Abnormal   Collection Time: 07/03/22  6:49 PM  Result Value Ref Range   Glucose-Capillary 245 (H) 70 - 99 mg/dL  Glucose, capillary     Status: Abnormal   Collection Time: 07/03/22  8:37 PM  Result Value Ref Range   Glucose-Capillary 238 (H) 70 - 99 mg/dL  CBC     Status: Abnormal   Collection Time: 07/04/22  7:47 AM  Result Value Ref Range   WBC 8.2 4.0 - 10.5 K/uL   RBC 3.71 (L) 3.87 - 5.11 MIL/uL   Hemoglobin 11.2 (L) 12.0 - 15.0 g/dL   HCT 34.3 (L) 36.0 - 46.0 %   MCV 92.5 80.0 - 100.0 fL   MCH 30.2 26.0 - 34.0 pg   MCHC 32.7 30.0 - 36.0 g/dL   RDW 12.9 11.5 - 15.5 %   Platelets 129 (L) 150 - 400 K/uL   nRBC 0.0 0.0 - 0.2 %  Basic metabolic panel      Status: Abnormal   Collection Time: 07/04/22  7:47 AM  Result Value Ref Range   Sodium 134 (L) 135 - 145 mmol/L   Potassium 4.1 3.5 - 5.1 mmol/L   Chloride 103 98 - 111 mmol/L   CO2 23 22 - 32 mmol/L   Glucose, Bld 239 (H) 70 - 99 mg/dL   BUN 9 8 - 23 mg/dL   Creatinine, Ser 0.50 0.44 - 1.00 mg/dL   Calcium 8.1 (L) 8.9 - 10.3 mg/dL   GFR, Estimated >60 >60 mL/min   Anion gap 8 5 - 15  ABO/Rh     Status: None (Preliminary result)   Collection Time: 07/04/22  7:47 AM  Result Value Ref Range   ABO/RH(D) PENDING     DG HIP UNILAT WITH PELVIS 2-3 VIEWS RIGHT  Result Date: 07/03/2022 CLINICAL DATA:  Intramedullary nail. EXAM: DG HIP (WITH OR WITHOUT PELVIS) 2-3V RIGHT COMPARISON:  07/02/2022 FINDINGS: Three images are submitted, showing intramedullary nail and lag screw transfixing intertrochanteric fracture of the  RIGHT hip. IMPRESSION: Intramedullary nail and lag screw transfixing intertrochanteric fracture of the RIGHT hip. ORIF of the RIGHT hip. Electronically Signed   By: Nolon Nations M.D.   On: 07/03/2022 16:27   DG C-Arm 1-60 Min-No Report  Result Date: 07/03/2022 Fluoroscopy was utilized by the requesting physician.  No radiographic interpretation.   DG HIP UNILAT WITH PELVIS 2-3 VIEWS RIGHT  Result Date: 07/02/2022 CLINICAL DATA:  10176. Fall injury. Suspected right hip fracture. History of left hip surgery. EXAM: DG HIP (WITH OR WITHOUT PELVIS) 2-3V RIGHT; PORTABLE CHEST - 1 VIEW COMPARISON:  None Available. FINDINGS: Chest AP portable: The cardiomediastinal silhouette and vasculature are normal apart from calcifications in the aortic arch. The lungs are clear. The sulci are sharp. There is osteopenia and slight thoracic dextroscoliosis. There is artifact from overlying clothing. The thoracic cage grossly intact. AP pelvis and AP and cross-table lateral right hip views: Generalized osteopenia. Comminuted intertrochanteric proximal right femoral fracture is noted, with the  lesser trochanter separately fractured and medially displaced and with the main distal fragment impacted and mildly cephalad displaced, also anteriorly displaced by about 1/3 of a shaft width. There is an irregular sclerotic lesion in the proximal right femoral shaft just below the level of the fracture, which could be a chondroid lesion or old bone infarct. Sclerotic metastasis not strictly excluded. There is normal spacing at the right hip, SI joints, symphysis pubis. There is moderate degenerative arthrosis at the left hip with mild femoral head flattening consistent with AVN. A left femoral intramedullary rod is partially visible. The visualized portion without evidence of loosening. There are pelvic phleboliths. No other significant soft tissue findings. Mild-to-moderate fecal stasis. IMPRESSION: 1. No evidence of acute chest disease.  Aortic atherosclerosis. 2. Comminuted intertrochanteric proximal right femoral fracture, with the main distal fragment impacted and anteriorly displaced by about 1/3 of a shaft width. 3. Irregular sclerotic lesion in the proximal right femoral shaft, probably either a chondroid lesion or old bone infarct. Sclerotic metastasis not strictly excluded. 4. Left hip AVN and secondary DJD. Electronically Signed   By: Telford Nab M.D.   On: 07/02/2022 23:33   DG Chest Port 1 View  Result Date: 07/02/2022 CLINICAL DATA:  10176. Fall injury. Suspected right hip fracture. History of left hip surgery. EXAM: DG HIP (WITH OR WITHOUT PELVIS) 2-3V RIGHT; PORTABLE CHEST - 1 VIEW COMPARISON:  None Available. FINDINGS: Chest AP portable: The cardiomediastinal silhouette and vasculature are normal apart from calcifications in the aortic arch. The lungs are clear. The sulci are sharp. There is osteopenia and slight thoracic dextroscoliosis. There is artifact from overlying clothing. The thoracic cage grossly intact. AP pelvis and AP and cross-table lateral right hip views: Generalized  osteopenia. Comminuted intertrochanteric proximal right femoral fracture is noted, with the lesser trochanter separately fractured and medially displaced and with the main distal fragment impacted and mildly cephalad displaced, also anteriorly displaced by about 1/3 of a shaft width. There is an irregular sclerotic lesion in the proximal right femoral shaft just below the level of the fracture, which could be a chondroid lesion or old bone infarct. Sclerotic metastasis not strictly excluded. There is normal spacing at the right hip, SI joints, symphysis pubis. There is moderate degenerative arthrosis at the left hip with mild femoral head flattening consistent with AVN. A left femoral intramedullary rod is partially visible. The visualized portion without evidence of loosening. There are pelvic phleboliths. No other significant soft tissue findings. Mild-to-moderate fecal stasis. IMPRESSION: 1.  No evidence of acute chest disease.  Aortic atherosclerosis. 2. Comminuted intertrochanteric proximal right femoral fracture, with the main distal fragment impacted and anteriorly displaced by about 1/3 of a shaft width. 3. Irregular sclerotic lesion in the proximal right femoral shaft, probably either a chondroid lesion or old bone infarct. Sclerotic metastasis not strictly excluded. 4. Left hip AVN and secondary DJD. Electronically Signed   By: Telford Nab M.D.   On: 07/02/2022 23:33   CT Head Wo Contrast  Result Date: 07/02/2022 CLINICAL DATA:  Head and neck trauma. Patient arrives from assisted living facility post unwitnessed fall. History of dementia. EXAM: CT HEAD WITHOUT CONTRAST CT CERVICAL SPINE WITHOUT CONTRAST TECHNIQUE: Multidetector CT imaging of the head and cervical spine was performed following the standard protocol without intravenous contrast. Multiplanar CT image reconstructions of the cervical spine were also generated. RADIATION DOSE REDUCTION: This exam was performed according to the  departmental dose-optimization program which includes automated exposure control, adjustment of the mA and/or kV according to patient size and/or use of iterative reconstruction technique. COMPARISON:  MR head dated September 23, 2019 FINDINGS: CT HEAD FINDINGS Brain: No evidence of acute infarction, hemorrhage, hydrocephalus, extra-axial collection or mass lesion/mass effect. Generalized cerebral atrophy. Patchy area of low-attenuation of the periventricular white matter presumed advanced chronic microvascular ischemic changes. 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 Vascular: No hyperdense vessel or unexpected calcification. Skull: Normal. Negative for fracture or focal lesion. Sinuses/Orbits: No acute finding.  Right scleral band. Other: None. CT CERVICAL SPINE FINDINGS Alignment: Normal. Skull base and vertebrae: No acute fracture. No primary bone lesion or focal pathologic process. Soft tissues and spinal canal: No prevertebral fluid or swelling. No visible canal hematoma. Disc levels: C2-C3:  Mild bilateral facet joint arthropathy. C3-C4: Disc height loss and uncovertebral joint arthropathy. Moderate bilateral facet joint arthropathy. No significant neural foraminal stenosis. C4-C5: Moderate bilateral facet joint arthropathy. No significant spinal canal or neural foraminal stenosis. C5-C6: Disc height loss and uncovertebral joint arthropathy with moderate left neural foraminal stenosis. Moderate bilateral facet joint arthropathy. C6-C7: Disc height loss and uncovertebral joint arthropathy. Mild bilateral neural foraminal stenosis. C7-T1:  No significant finding.  Mild right facet joint arthropathy. Upper chest: Negative. Other: None IMPRESSION: CT HEAD: 1. No acute intracranial abnormality. 2. Generalized cerebral atrophy and advanced chronic microvascular ischemic changes of the white matter. 3. 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 CT CERVICAL SPINE: 1.  No acute fracture or subluxation. 2. Moderate multilevel degenerative disc and facet joint disease. Electronically Signed   By: Keane Police D.O.   On: 07/02/2022 23:09   CT Cervical Spine Wo Contrast  Result Date: 07/02/2022 CLINICAL DATA:  Head and neck trauma. Patient arrives from assisted living facility post unwitnessed fall. History of dementia. EXAM: CT HEAD WITHOUT CONTRAST CT CERVICAL SPINE WITHOUT CONTRAST TECHNIQUE: Multidetector CT imaging of the head and cervical spine was performed following the standard protocol without intravenous contrast. Multiplanar CT image reconstructions of the cervical spine were also generated. RADIATION DOSE REDUCTION: This exam was performed according to the departmental dose-optimization program which includes automated exposure control, adjustment of the mA and/or kV according to patient size and/or use of iterative reconstruction technique. COMPARISON:  MR head dated September 23, 2019 FINDINGS: CT HEAD FINDINGS Brain: No evidence of acute infarction, hemorrhage, hydrocephalus, extra-axial collection or mass lesion/mass effect. Generalized cerebral atrophy. Patchy area of low-attenuation of the periventricular white matter presumed advanced chronic microvascular ischemic changes. Old right parieto-occipital and right cerebellar  infarct, unchanged. Vascular: No hyperdense vessel or unexpected calcification. Skull: Normal. Negative for fracture or focal lesion. Sinuses/Orbits: No acute finding.  Right scleral band. Other: None. CT CERVICAL SPINE FINDINGS Alignment: Normal. Skull base and vertebrae: No acute fracture. No primary bone lesion or focal pathologic process. Soft tissues and spinal canal: No prevertebral fluid or swelling. No visible canal hematoma. Disc levels: C2-C3:  Mild bilateral facet joint arthropathy. C3-C4: Disc height loss and uncovertebral joint arthropathy. Moderate bilateral facet joint arthropathy. No significant neural foraminal stenosis. C4-C5:  Moderate bilateral facet joint arthropathy. No significant spinal canal or neural foraminal stenosis. C5-C6: Disc height loss and uncovertebral joint arthropathy with moderate left neural foraminal stenosis. Moderate bilateral facet joint arthropathy. C6-C7: Disc height loss and uncovertebral joint arthropathy. Mild bilateral neural foraminal stenosis. C7-T1:  No significant finding.  Mild right facet joint arthropathy. Upper chest: Negative. Other: None IMPRESSION: CT HEAD: 1. No acute intracranial abnormality. 2. Generalized cerebral atrophy and advanced chronic microvascular ischemic changes of the white matter. 3. 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 CT CERVICAL SPINE: 1. No acute fracture or subluxation. 2. Moderate multilevel degenerative disc and facet joint disease. Electronically Signed   By: Keane Police D.O.   On: 07/02/2022 23:09    Assessment/Plan: 1 Day Post-Op   Principal Problem:   Closed right hip fracture (HCC) Active Problems:   Depression   Type 2 diabetes mellitus with hyperglycemia, without long-term current use of insulin (HCC)   Dyslipidemia   Acute lower UTI   Up with therapy  WBAT RLE hydrodone for pain (rx in chart) Aspirin 81 mg once daily X 30 days Discharge per medicine probable SNF Keep dressing (aquacel) on until follow up Follow up in office [facility]  Call to confirm appt [phone] with Carlynn Spry PA-C July 16, 2022    Carlynn Spry , PA-C 07/04/2022, 8:33 AM

## 2022-07-04 NOTE — Evaluation (Signed)
Physical Therapy Evaluation Patient Details Name: Terry Watson MRN: [id number] DOB: March 30, 1946 Today's Date: 07/04/2022  History of Present Illness  Terry Watson is a 76 y.o. Caucasian female with medical history significant for type 2 diabetes mellitus, dementia, dyslipidemia and osteoporosis, who presented to the ER with acute onset of unwitnessed fall at her SNF with subsequent right hip pain when she was given an attempt to stand by the staff. Pt s/p R hip IM nailing 07/03/22.   Clinical Impression  Pt admitted with above diagnosis. Pt received upright in bed, lethargic. Easily awakens to voice. Son at bedside assisting in subjective reports. Per son, pt lives in [place] at [place]. At baseline she ambulates with very short distances with staff and requires assist with ADL's/IADL's. Nt present in room taking vitals reporting pt's PSO2 at 82% on RA. Pt placed on 2L/min via [place] with return to >/= 90%. NT stating she will update RN.   To date, pt attempts to follow single step commands, mobilizing LE's to EOB but overall requiring maxA and bed features to sit EOB with initial, heavy posterior and L lateral lean requiring mod to max multimodal cuing for upright posture with feet and hand placement. Once situated, pt maintain sitting EOB with close supervision. Reports dizziness. BP taken in sitting recording at 124/52 mm Hg. Pt stands to RW with modA with significant L weight shift, heavy UE support on RW. Unable to progress LLE towards the L to [facility] due  to limited stance tolerance on RLE. Very laborious, minimal foot clearance with 2 lateral steps before sitting and returning to supine. Pt with increased WOB. Pt with all needs in reach, updating family on trialing BID to assess tolerance for activity. Pt currently with functional limitations due to the deficits listed below (see PT Problem List). Pt will benefit from skilled PT to increase their independence and safety with mobility to allow discharge  to the venue listed below.       Recommendations for follow up therapy are one component of a multi-disciplinary discharge planning process, led by the attending physician.  Recommendations may be updated based on patient status, additional functional criteria and insurance authorization.  Follow Up Recommendations Skilled nursing-short term rehab (<3 hours/day) Can patient physically be transported by private vehicle: No    Assistance Recommended at Discharge Frequent or constant Supervision/Assistance  Patient can return home with the following  A lot of help with bathing/dressing/bathroom;Two people to help with walking and/or transfers;Assist for transportation;Help with stairs or ramp for entrance;A lot of help with walking and/or transfers    Equipment Recommendations Other (comment) (deferred to next venue of care.)  Recommendations for Other Services       Functional Status Assessment Patient has had a recent decline in their functional status and/or demonstrates limited ability to make significant improvements in function in a reasonable and predictable amount of time     Precautions / Restrictions Precautions Precautions: Fall Restrictions Weight Bearing Restrictions: Yes RLE Weight Bearing: Weight bearing as tolerated      Mobility  Bed Mobility Overal bed mobility: Needs Assistance Bed Mobility: Supine to Sit, Sit to Supine     Supine to sit: Max assist, HOB elevated Sit to supine: Max assist, +2 for physical assistance   General bed mobility comments: Assist from NT to return to supine. Pt attempting mobilizing LE"s to EOB. APpears cognition limiting full participation in following commands. Patient Response: Cooperative, Flat affect  Transfers Overall transfer level: Needs assistance Equipment  used: Rolling walker (2 wheels) Transfers: Sit to/from Stand Sit to Stand: From elevated surface, Mod assist           General transfer comment: modA and bed  elevated to attain standing. LImited stance and WB on RLE with significant UE support and kyphotic posture.    Ambulation/Gait               General Gait Details: deferred  Stairs            Wheelchair Mobility    Modified Rankin (Stroke Patients Only)       Balance Overall balance assessment: Needs assistance Sitting-balance support: Bilateral upper extremity supported, Feet supported Sitting balance-Leahy Scale: Poor Sitting balance - Comments: Initial posterior leaning and left lateral lean. With time in sitting able to sit upright unsupported. Postural control: Posterior lean, Left lateral lean Standing balance support: Bilateral upper extremity supported, During functional activity, Reliant on assistive device for balance Standing balance-Leahy Scale: Poor Standing balance comment: L weightshift, B UE support, kyphotic posture, very laborious.                             Pertinent Vitals/Pain Pain Assessment Pain Assessment: Faces Faces Pain Scale: Hurts little more Pain Location: R hip Pain Descriptors / Indicators: Moaning, Discomfort, Grimacing, Guarding Pain Intervention(s): Limited activity within patient's tolerance, Monitored during session, Repositioned, Ice applied    Home Living Family/patient expects to be discharged to:: Assisted living                 Home Equipment: Standard Walker      Prior Function Prior Level of Function : Needs assist       Physical Assist : Mobility (physical);ADLs (physical) Mobility (physical): Transfers;Bed mobility;Gait ADLs (physical): Grooming;Bathing;Dressing;Toileting;IADLs Mobility Comments: Per son in room, pt ambulating very short household dsitances with staff ADLs Comments: Requires staff assistance.     Hand Dominance        Extremity/Trunk Assessment                Communication   Communication: Other (comment) (baseline dementia limiting communication.)  Cognition  Arousal/Alertness: Awake/alert Behavior During Therapy: Flat affect Overall Cognitive Status: History of cognitive impairments - at baseline                                 General Comments: baseline dementia        General Comments General comments (skin integrity, edema, etc.): SPO2 inititally on 82% on RA found by NT. PT placing pt on 2 L/min [place] returning to > 90% with PLB.    Exercises Other Exercises Other Exercises: Role of PT in acute setting, safe use of DME, updated family on need for increased, skilled services at discharge and PT recs.   Assessment/Plan    PT Assessment Patient needs continued PT services  PT Problem List Decreased strength;Decreased cognition;Decreased range of motion;Decreased knowledge of use of DME;Decreased activity tolerance;Decreased safety awareness;Decreased balance;Pain;Decreased mobility       PT Treatment Interventions DME instruction;Balance training;Gait training;Neuromuscular re-education;Cognitive remediation;Functional mobility training;Patient/family education;Therapeutic activities;Therapeutic exercise    PT Goals (Current goals can be found in the Care Plan section)  Acute Rehab PT Goals Patient Stated Goal: improve mobility PT Goal Formulation: With family Time For Goal Achievement: 07/18/22 Potential to Achieve Goals: Fair    Frequency BID     Co-evaluation  AM-PAC PT "6 Clicks" Mobility  Outcome Measure Help needed turning from your back to your side while in a flat bed without using bedrails?: A Lot Help needed moving from lying on your back to sitting on the side of a flat bed without using bedrails?: A Lot Help needed moving to and from a bed to a chair (including a wheelchair)?: Total Help needed standing up from a chair using your arms (e.g., wheelchair or bedside chair)?: A Lot Help needed to walk in hospital room?: Total Help needed climbing 3-5 steps with a railing? : Total 6  Click Score: 9    End of Session Equipment Utilized During Treatment: Gait belt;Oxygen Activity Tolerance: Patient limited by fatigue;Patient limited by pain;Other (comment) (cognition.) Patient left: in bed;with call bell/phone within reach;with bed alarm set;with nursing/sitter in room;with family/visitor present Nurse Communication: Mobility status PT Visit Diagnosis: Unsteadiness on feet (R26.81);Other abnormalities of gait and mobility (R26.89);Difficulty in walking, not elsewhere classified (R26.2);Muscle weakness (generalized) (M62.81)    Time: 0865-7846 PT Time Calculation (min) (ACUTE ONLY): 34 min   Charges:   PT Evaluation $PT Eval Moderate Complexity: 1 Mod PT Treatments $Gait Training: 8-22 mins        Salem Caster. Fairly IV, PT, DPT Physical Therapist- [facility]  07/04/2022, 10:41 AM

## 2022-07-04 NOTE — TOC Initial Note (Signed)
Transition of Care [facility]) - Initial/Assessment Note    Patient Details  Name: Terry Watson MRN: [id number] Date of Birth: [birth year]  Transition of Care [facility]) CM/SW Contact:    Magnus Ivan, LCSW Phone Number: 07/04/2022, 10:17 AM  Clinical Narrative:                 CSW called and spoke with Tammy with [facility]. Tammy stated patient is from their ALF side.  Tammy stated patient was not currently receiving [place] services at [facility]. Tammy stated they have a RW for patient that they have been trying to get her to use.  Tammy stated if patient does not need SNF and PT/OT feel she can come back to ALF, Tammy would need to assess patient first and the earliest she can do this is Tuesday. Tammy stated if patient does need SNF, TOC to reach out to daughter in law Patty as main contact. TOC will continue to follow.     Barriers to Discharge: Continued Medical Work up   Patient Goals and CMS Choice            Expected Discharge Plan and Services       Living arrangements for the past 2 months: [place]                                      Prior Living Arrangements/Services Living arrangements for the past 2 months: [place] Lives with:: Facility Resident Patient language and need for interpreter reviewed:: Yes        Need for Family Participation in Patient Care: Yes (Comment) Care giver support system in place?: Yes (comment) Current home services: DME Criminal Activity/Legal Involvement Pertinent to Current Situation/Hospitalization: No - Comment as needed  Activities of Daily Living Home Assistive Devices/Equipment: Eyeglasses, Dentures (specify type) ADL Screening (condition at time of admission) Patient's cognitive ability adequate to safely complete daily activities?: No Is the patient deaf or have difficulty hearing?: No Does the patient have difficulty seeing, even when wearing glasses/contacts?: No Does the  patient have difficulty concentrating, remembering, or making decisions?: Yes Patient able to express need for assistance with ADLs?: No Does the patient have difficulty dressing or bathing?: Yes Independently performs ADLs?: No Does the patient have difficulty walking or climbing stairs?: Yes Weakness of Legs: Both Weakness of Arms/Hands: Both  Permission Sought/Granted                  Emotional Assessment         Alcohol / Substance Use: Not Applicable Psych Involvement: No (comment)  Admission diagnosis:  Acute UTI [N39.0] Closed right hip fracture ([place]) [S72.001A] Fall, initial encounter [W19.XXXA] Closed intertrochanteric fracture of hip, right, initial encounter ([place]) [S72.141A] Patient Active Problem List   Diagnosis Date Noted   Closed right hip fracture ([place]) 07/03/2022   Dyslipidemia 07/03/2022   Type 2 diabetes mellitus without complications ([place]) 01/04/1600   Closed intertrochanteric fracture of hip, right, initial encounter ([place]) 07/03/2022   Acute UTI 07/03/2022   Acute lower UTI 07/03/2022   Memory loss 02/05/2022   Type 2 diabetes mellitus with hyperglycemia, without long-term current use of insulin ([place]) 02/05/2022   Encounter for completion of form with patient 02/05/2022   Depression 01/20/2022   Osteoporosis, post-menopausal 01/20/2022   Weight loss 06/13/2021   Glaucoma of both eyes 06/13/2021   Encounter for screening mammogram for  malignant neoplasm of breast 06/13/2021   Skin tear of hand without complication, left, sequela 06/13/2021   Fall 06/13/2021   Palpitations 11/23/2019   High blood pressure 11/07/2019   Stroke ([place]) 11/07/2019   History of stroke 10/27/2019   Memory loss of unknown cause 09/12/2019   Adjustment disorder with mixed anxiety and depressed mood 09/12/2019   Loss of memory 09/05/2019   T2DM (type 2 diabetes mellitus) ([place]) 05/09/2019   Personal history of traumatic brain injury 05/09/2019   Hyperlipidemia associated  with type 2 diabetes mellitus ([place]) 05/09/2019   Personal history of traumatic brain injury 05/09/2019   Hyperlipidemia 08/02/2012   Dupuytren's contracture of both hands 05/05/2012   PCP:  Virginia Crews, MD Pharmacy:   [facility] [address]), [place]) [place] [address] Phone: [phone] Fax: [place] [address] [place] [address] Phone: [phone] Fax: [place], [place] [place] [address] Phone: [phone] Fax: [phone]     Social Determinants of Health (SDOH) Social History: [place]: No Food Insecurity (07/03/2022)  Housing: Low Risk  (07/03/2022)  Transportation Needs: No Transportation Needs (01/21/2022)  Utilities: Not At Risk (07/03/2022)  Alcohol Screen: Low Risk  (01/21/2022)  Depression (PHQ2-9): Medium Risk (02/05/2022)  Financial Resource Strain: Low Risk  (01/21/2022)  Physical Activity: Insufficiently Active (01/21/2022)  Social Connections: Moderately Isolated (01/21/2022)  Stress: No Stress Concern Present (01/21/2022)  Tobacco Use: Low Risk  (07/04/2022)   SDOH Interventions:     Readmission Risk Interventions     No data to display

## 2022-07-04 NOTE — NC FL2 (Signed)
[place] LEVEL OF CARE FORM     IDENTIFICATION  Patient Name: Terry Watson Birthdate: 1946-03-05 Sex: female Admission Date (Current Location): 07/03/2022  [facility] and [place] Number:  Engineering geologist and Address:  [facility], [address], [place], [place] [address]      Provider Number: [id number]  Attending Physician Name and Address:  Lorella Nimrod, MD  Relative Name and Phone Number:  Thum,Patty (Relative) [phone] (Mobile)    Current Level of Care: Hospital Recommended Level of Care: [place] Prior Approval Number:    Date Approved/Denied:   PASRR Number: [id number]  Discharge Plan: Home    Current Diagnoses: Patient Active Problem List   Diagnosis Date Noted   Closed right hip fracture ([place]) 07/03/2022   Dyslipidemia 07/03/2022   Type 2 diabetes mellitus without complications ([place]) 19/37/9024   Closed intertrochanteric fracture of hip, right, initial encounter ([place]) 07/03/2022   Acute UTI 07/03/2022   Acute lower UTI 07/03/2022   Memory loss 02/05/2022   Type 2 diabetes mellitus with hyperglycemia, without long-term current use of insulin ([place]) 02/05/2022   Encounter for completion of form with patient 02/05/2022   Depression 01/20/2022   Osteoporosis, post-menopausal 01/20/2022   Weight loss 06/13/2021   Glaucoma of both eyes 06/13/2021   Encounter for screening mammogram for malignant neoplasm of breast 06/13/2021   Skin tear of hand without complication, left, sequela 06/13/2021   Fall 06/13/2021   Palpitations 11/23/2019   High blood pressure 11/07/2019   Stroke ([place]) 11/07/2019   History of stroke 10/27/2019   Memory loss of unknown cause 09/12/2019   Adjustment disorder with mixed anxiety and depressed mood 09/12/2019   Loss of memory 09/05/2019   T2DM (type 2 diabetes mellitus) ([place]) 05/09/2019   Personal history of traumatic brain injury 05/09/2019   Hyperlipidemia  associated with type 2 diabetes mellitus ([place]) 05/09/2019   Personal history of traumatic brain injury 05/09/2019   Hyperlipidemia 08/02/2012   Dupuytren's contracture of both hands 05/05/2012    Orientation RESPIRATION BLADDER Height & Weight     Self  O2 External catheter Weight: 117 lb 1 oz (53.1 kg) Height:  '5\' 3"'$  (160 cm)  BEHAVIORAL SYMPTOMS/MOOD NEUROLOGICAL BOWEL NUTRITION STATUS        Diet (regular)  AMBULATORY STATUS COMMUNICATION OF NEEDS Skin   Extensive Assist Verbally Surgical wounds (r hip)                       Personal Care Assistance Level of Assistance  Bathing, Feeding, Dressing Bathing Assistance: Maximum assistance Feeding assistance: Maximum assistance Dressing Assistance: Maximum assistance     Functional Limitations Info             SPECIAL CARE FACTORS FREQUENCY  PT (By licensed PT), OT (By licensed OT)     PT Frequency: 5 times per week OT Frequency: 5 times per week            Contractures      Additional Factors Info  Code Status, Allergies Code Status Info: full Allergies Info: nka           Current Medications (07/04/2022):  This is the current hospital active medication list Current Facility-Administered Medications  Medication Dose Route Frequency Provider Last Rate Last Admin   0.9 %  sodium chloride infusion   Intravenous Continuous Lovell Sheehan, MD 75 mL/hr at 07/04/22 1119 New Bag at 07/04/22 1119   aspirin chewable tablet  81 mg  81 mg Oral Daily Lovell Sheehan, MD   81 mg at 07/04/22 1062   atorvastatin (LIPITOR) tablet 40 mg  40 mg Oral Daily Lovell Sheehan, MD   40 mg at 07/04/22 6948   calcium-vitamin D (OSCAL WITH D) 500-5 MG-MCG per tablet 1 tablet  1 tablet Oral Daily Lovell Sheehan, MD   1 tablet at 07/04/22 0939   cefTRIAXone (ROCEPHIN) 1 g in sodium chloride 0.9 % 100 mL IVPB  1 g Intravenous Q24H Lovell Sheehan, MD   Stopped at 07/03/22 2229   docusate sodium (COLACE) capsule 100 mg  100 mg Oral  BID Lovell Sheehan, MD   100 mg at 07/04/22 5462   Fe Fum-Vit C-Vit B12-FA (TRIGELS-F FORTE) capsule 1 capsule  1 capsule Oral QPC breakfast Lorella Nimrod, MD   1 capsule at 07/04/22 0943   HYDROcodone-acetaminophen (NORCO/VICODIN) 5-325 MG per tablet 1-2 tablet  1-2 tablet Oral Q6H PRN Lovell Sheehan, MD   1 tablet at 07/04/22 1048   insulin aspart (novoLOG) injection 0-5 Units  0-5 Units Subcutaneous QHS Lorella Nimrod, MD       insulin aspart (novoLOG) injection 0-9 Units  0-9 Units Subcutaneous TID WC Lorella Nimrod, MD   7 Units at 07/04/22 1311   insulin aspart (novoLOG) injection 3 Units  3 Units Subcutaneous TID WC Lorella Nimrod, MD   3 Units at 07/04/22 1316   insulin glargine-yfgn (SEMGLEE) injection 5 Units  5 Units Subcutaneous BID Lorella Nimrod, MD   5 Units at 07/04/22 1048   latanoprost (XALATAN) 0.005 % ophthalmic solution 1 drop  1 drop Both Eyes QHS Lovell Sheehan, MD   1 drop at 07/03/22 2250   magnesium hydroxide (MILK OF MAGNESIA) suspension 30 mL  30 mL Oral Daily PRN Lovell Sheehan, MD       metoCLOPramide (REGLAN) tablet 5-10 mg  5-10 mg Oral Q8H PRN Lovell Sheehan, MD       Or   metoCLOPramide (REGLAN) injection 5-10 mg  5-10 mg Intravenous Q8H PRN Lovell Sheehan, MD       morphine (PF) 2 MG/ML injection 2 mg  2 mg Intravenous Q4H PRN Lovell Sheehan, MD   2 mg at 07/03/22 2035   multivitamin with minerals tablet 1 tablet  1 tablet Oral Daily Lovell Sheehan, MD   1 tablet at 07/04/22 7035   niacin (VITAMIN B3) ER tablet 500 mg  500 mg Oral QHS Lovell Sheehan, MD   500 mg at 07/03/22 2155   ondansetron (ZOFRAN) tablet 4 mg  4 mg Oral Q6H PRN Lovell Sheehan, MD       Or   ondansetron [facility]) injection 4 mg  4 mg Intravenous Q6H PRN Lovell Sheehan, MD       sertraline (ZOLOFT) tablet 50 mg  50 mg Oral Daily Lovell Sheehan, MD   50 mg at 07/04/22 0093     Discharge Medications: Please see discharge summary for a list of discharge medications.  Relevant Imaging  Results:  Relevant Lab Results:   Additional Information SS #: [id number]  [place], LCSW

## 2022-07-04 NOTE — Plan of Care (Signed)
?  Problem: Nutrition: ?Goal: Adequate nutrition will be maintained ?Outcome: Progressing ?  ?Problem: Elimination: ?Goal: Will not experience complications related to bowel motility ?Outcome: Progressing ?Goal: Will not experience complications related to urinary retention ?Outcome: Progressing ?  ?Problem: Pain Managment: ?Goal: General experience of comfort will improve ?Outcome: Progressing ?  ?  ?

## 2022-07-04 NOTE — Assessment & Plan Note (Signed)
CBG elevated today, she was on metformin at home which is being currently held - Recheck A1c -Add low-dose Semglee -Add 3 units with meal if he eats more than 50% -SSI

## 2022-07-04 NOTE — Progress Notes (Signed)
Initial Nutrition Assessment  DOCUMENTATION CODES:   Severe malnutrition in context of chronic illness  INTERVENTION:   -Ensure Enlive po BID, each supplement provides 350 kcal and 20 grams of protein.  -Magic cup TID with meals, each supplement provides 290 kcal and 9 grams of protein  -MVI with minerals daily  NUTRITION DIAGNOSIS:   Severe Malnutrition related to chronic illness (dementia) as evidenced by moderate fat depletion, severe fat depletion, moderate muscle depletion, severe muscle depletion.  GOAL:   Patient will meet greater than or equal to 90% of their needs  MONITOR:   PO intake, Supplement acceptance  REASON FOR ASSESSMENT:   Malnutrition Screening Tool    ASSESSMENT:   Pt with medical history significant for type 2 diabetes mellitus, dementia, dyslipidemia and osteoporosis, who presented with acute onset of unwitnessed fall at her SNF with subsequent right hip pain when she was given an attempt to stand by the staff.  Pt admitted with closed rt hip fracture.   12/28- s/p PROCEDURE:  RIGHT INTRAMEDULLARY (IM) NAIL INTERTROCHANTERIC   Reviewed I/O's: +1.7 L x 24 hours  UOP: 10 ml x 24 hours   Pt lying in bed at time of visit. She did not arouse ot voice or touch. No family at bedside to provide additional history. Per chart review, pt resided at [facility].   Pt currently on a regular diet. No meal completion data available to assess at this time.   Reviewed wt hx; wt has been stable over the past 3 months.   Pt with malnutrition and would benefit from nutrient dense supplement. One Ensure Enlive supplement provides 350 kcals, 20 grams protein, and 44-45 grams of carbohydrate vs one Glucerna shake supplement, which provides 220 kcals, 10 grams of protein, and 26 grams of carbohydrate. Given pt's hx of DM, RD will reassess adequacy of PO intake, CBGS, and adjust supplement regimen as appropriate at follow-up.    Medications reviewed and include  colace, vitamin B3, and 0.9% sodium chloride infusion @ 75 ml/hr.   Lab Results  Component Value Date   HGBA1C 7.7 (H) 03/24/2022   PTA DM medications are 500 mg metformin BID. Per ADA's Standards of Medical Care of Diabetes, glycemic targets for older adults who have multiple co-morbidities, cognitive impairments, and functional dependence should be less stringent (Hgb A1c <8.0-8.5).     Labs reviewed: Na: 134, CBGS: 217-319 (inpatient orders for glycemic control are 0-5 units insulin aspart daily at bedtime, 0-9 units insulin aspart TID with meals, 3 units insulin aspart TID with meals, and 5 units insulin glargine-yfgn BID).    NUTRITION - FOCUSED PHYSICAL EXAM:  Flowsheet Row Most Recent Value  Orbital Region Severe depletion  Upper Arm Region Moderate depletion  Thoracic and Lumbar Region Moderate depletion  Buccal Region Severe depletion  Temple Region Severe depletion  Clavicle Bone Region Severe depletion  Clavicle and Acromion Bone Region Severe depletion  Scapular Bone Region Severe depletion  Dorsal Hand Severe depletion  Patellar Region Moderate depletion  Anterior Thigh Region Moderate depletion  Posterior Calf Region Moderate depletion  Edema (RD Assessment) None  Hair Reviewed  Eyes Reviewed  Mouth Reviewed  Skin Reviewed  Nails Reviewed       Diet Order:   Diet Order             Diet regular Room service appropriate? Yes; Fluid consistency: Thin  Diet effective now  EDUCATION NEEDS:   Not appropriate for education at this time  Skin:  Skin Assessment: Skin Integrity Issues: Skin Integrity Issues:: Incisions Incisions: closed rt hip  Last BM:  Unknown  Height:   Ht Readings from Last 1 Encounters:  07/03/22 '5\' 3"'$  (1.6 m)    Weight:   Wt Readings from Last 1 Encounters:  07/03/22 53.1 kg    Ideal Body Weight:  52.3 kg  BMI:  Body mass index is 20.74 kg/m.  Estimated Nutritional Needs:   Kcal:   1600-1800  Protein:  80-95 grams  Fluid:  > 1.6 L    Loistine Chance, RD, LDN, [place] Registered Dietitian II Certified Diabetes Care and Education Specialist Please refer to [facility] for RD and/or RD on-call/weekend/after hours pager

## 2022-07-04 NOTE — Discharge Instructions (Signed)
No tight elastic waist bands over the bandage. Not wearing underwear is preferred, or pull waist band above the bandage.  May shower with bandage in place.  If bandage becomes saturated, OK to removal and place band-aid.  You may be up walking around as tolerated but should take periodic breaks to elevate your legs.    Pain medication can cause constipation.  You should increase your fluid intake, increase your intake of high fiber foods and/or take Metamucil as needed for constipation.  You may shower.  You do NOT need to cover the dressing or incision site with plastic wrap.  The dressing or incision can get wet, but do not submerge under water.  After your staples have been removed, you should wait 48 hours before submerging incision under water.  Continue your physical therapy exercises at least twice daily.  It is a good idea to use an ice pack for 30 minutes after doing your exercises to reduce swelling.  Do not be surprised if you have increased pain at night.  This usually means you have been a little too active during the day and need to reduce your activities.  If you develop lower extremity swelling that does not improve after a night of elevation, please call the office.  This could be an early sign of a blood clot.  Call with questions, fever>101.5 degrees, shortness of breath or drainage from the wound  [phone]  WBAT RLE hydrodone for pain (rx in chart) Aspirin 81 mg once daily X 30 days Discharge per medicine Keep dressing (aquacel) on until follow up Follow up in office [facility] [place]  Call to confirm appt [phone] with Carlynn Spry PA-C July 16, 2022

## 2022-07-04 NOTE — Assessment & Plan Note (Signed)
Secondary to unwitnessed fall at her facility, s/p ORIF. -Continue with pain management -PT/OT are recommending SNF

## 2022-07-04 NOTE — Evaluation (Signed)
Occupational Therapy Evaluation Patient Details Name: Terry Watson MRN: [id number] DOB: 1946-06-28 Today's Date: 07/04/2022   History of Present Illness Terry Watson is a 76 y.o. Caucasian female with medical history significant for type 2 diabetes mellitus, dementia, dyslipidemia and osteoporosis, who presented to the ER with acute onset of unwitnessed fall at her SNF with subsequent right hip pain when she was given an attempt to stand by the staff. Pt s/p R hip IM nailing 07/03/22.   Clinical Impression   Patient presenting with decreased Ind in self care, balance, functional mobility/transfers, endurance, and safety awareness. Pt's family present to provide PLOF. Pt lives in an ALF and ambulates without assistance or AD at baseline. She needs assistance from staff for self care tasks secondary to cognition. Pt on 2Ls via  this session but does not utilize O2 at baseline. Patient very lethargic during session and needing +2 assistance for bed mobility and sit <>Stand. Pt immediately closing eyes once returned to bed. Lethargy may be secondary to pain medications. Patient will benefit from acute OT to increase overall independence in the areas of ADLs, functional mobility, and safety awareness in order to safely discharge to next venue of care.      Recommendations for follow up therapy are one component of a multi-disciplinary discharge planning process, led by the attending physician.  Recommendations may be updated based on patient status, additional functional criteria and insurance authorization.   Follow Up Recommendations  Skilled nursing-short term rehab (<3 hours/day)     Assistance Recommended at Discharge Frequent or constant Supervision/Assistance  Patient can return home with the following Two people to help with walking and/or transfers;Two people to help with bathing/dressing/bathroom    Functional Status Assessment  Patient has had a recent decline in their functional  status and demonstrates the ability to make significant improvements in function in a reasonable and predictable amount of time.  Equipment Recommendations  Other (comment) (defer to next venue of care)       Precautions / Restrictions Precautions Precautions: Fall Restrictions Weight Bearing Restrictions: Yes RLE Weight Bearing: Weight bearing as tolerated      Mobility Bed Mobility Overal bed mobility: Needs Assistance Bed Mobility: Supine to Sit, Sit to Supine     Supine to sit: Max assist, +2 for physical assistance Sit to supine: Total assist, +2 for physical assistance        Transfers Overall transfer level: Needs assistance Equipment used: Rolling walker (2 wheels) Transfers: Sit to/from Stand Sit to Stand: Max assist, +2 physical assistance                  Balance Overall balance assessment: Needs assistance Sitting-balance support: Bilateral upper extremity supported, Feet supported Sitting balance-Leahy Scale: Fair     Standing balance support: Bilateral upper extremity supported, During functional activity, Reliant on assistive device for balance Standing balance-Leahy Scale: Zero                             ADL either performed or assessed with clinical judgement   ADL Overall ADL's : Needs assistance/impaired     Grooming: Wash/dry hands;Wash/dry face;Sitting;Set up;Supervision/safety                                 General ADL Comments: total A for LB self care secondary to cognition     Vision Patient Visual  Report: No change from baseline              Pertinent Vitals/Pain Pain Assessment Pain Assessment: Faces Faces Pain Scale: Hurts little more Pain Location: R hip Pain Descriptors / Indicators: Discomfort Pain Intervention(s): Limited activity within patient's tolerance, Monitored during session, Repositioned, Premedicated before session     Extremity/Trunk Assessment Upper Extremity  Assessment Upper Extremity Assessment: Generalized weakness   Lower Extremity Assessment Lower Extremity Assessment: Generalized weakness   Cervical / Trunk Assessment Cervical / Trunk Assessment: Normal   Communication Communication Communication: Other (comment) (baseline dementia)   Cognition Arousal/Alertness: Awake/alert Behavior During Therapy: Flat affect Overall Cognitive Status: History of cognitive impairments - at baseline                                 General Comments: baseline dementia                Home Living Family/patient expects to be discharged to:: Skilled nursing facility                             Home Equipment: Standard Walker          Prior Functioning/Environment Prior Level of Function : Needs assist             Mobility Comments: Per son in room, pt ambulating very short household dsitances with staff ADLs Comments: Requires staff assistance.        OT Problem List: Decreased strength;Decreased activity tolerance;Decreased safety awareness;Impaired balance (sitting and/or standing);Decreased knowledge of use of DME or AE;Decreased cognition      OT Treatment/Interventions: Self-care/ADL training;Therapeutic exercise;Therapeutic activities;Energy conservation;DME and/or AE instruction;Patient/family education;Balance training    OT Goals(Current goals can be found in the care plan section) Acute Rehab OT Goals Patient Stated Goal: to go to rehab and get stronger OT Goal Formulation: With patient/family Time For Goal Achievement: 07/18/22 Potential to Achieve Goals: Good ADL Goals Pt Will Perform Grooming: sitting;with supervision;with set-up Pt Will Transfer to Toilet: with min assist;stand pivot transfer;bedside commode Pt Will Perform Toileting - Clothing Manipulation and hygiene: with min assist;sit to/from stand  OT Frequency: Min 2X/week    Co-evaluation PT/OT/SLP Co-Evaluation/Treatment:  Yes Reason for Co-Treatment: Complexity of the patient's impairments (multi-system involvement);For patient/therapist safety;To address functional/ADL transfers PT goals addressed during session: Mobility/safety with mobility OT goals addressed during session: ADL's and self-care      AM-PAC OT "6 Clicks" Daily Activity     Outcome Measure Help from another person eating meals?: A Little Help from another person taking care of personal grooming?: A Little Help from another person toileting, which includes using toliet, bedpan, or urinal?: Total Help from another person bathing (including washing, rinsing, drying)?: Total Help from another person to put on and taking off regular upper body clothing?: A Lot Help from another person to put on and taking off regular lower body clothing?: Total 6 Click Score: 11   End of Session Equipment Utilized During Treatment: Rolling walker (2 wheels) Nurse Communication: Mobility status  Activity Tolerance: Patient tolerated treatment well Patient left: in bed;with call bell/phone within reach;with bed alarm set  OT Visit Diagnosis: Unsteadiness on feet (R26.81);Muscle weakness (generalized) (M62.81);Repeated falls (R29.6)                Time: 6644-0347 OT Time Calculation (min): 21 min Charges:  OT General Charges $OT Visit:  1 Visit OT Evaluation $OT Eval Moderate Complexity: [address], [place], OTR/L , CBIS ascom [phone]  07/04/22, 3:51 PM

## 2022-07-05 DIAGNOSIS — S72001A Fracture of unspecified part of neck of right femur, initial encounter for closed fracture: Secondary | ICD-10-CM | POA: Diagnosis not present

## 2022-07-05 LAB — HEMOGLOBIN A1C
Hgb A1c MFr Bld: 8.8 % — ABNORMAL HIGH (ref 4.8–5.6)
Mean Plasma Glucose: 206 mg/dL

## 2022-07-05 LAB — GLUCOSE, CAPILLARY
Glucose-Capillary: 185 mg/dL — ABNORMAL HIGH (ref 70–99)
Glucose-Capillary: 229 mg/dL — ABNORMAL HIGH (ref 70–99)
Glucose-Capillary: 349 mg/dL — ABNORMAL HIGH (ref 70–99)
Glucose-Capillary: 247 mg/dL — ABNORMAL HIGH (ref 70–99)

## 2022-07-05 NOTE — Progress Notes (Signed)
Progress Note   Patient: Terry Watson NWG:[id number] DOB: 1945/09/18 DOA: 07/03/2022     2 DOS: the patient was seen and examined on 07/05/2022   Brief hospital course: Taken from H&P.  Terry Watson is a 76 y.o. Caucasian female with medical history significant for type 2 diabetes mellitus, dementia, dyslipidemia and osteoporosis, who presented to the ER with acute onset of unwitnessed fall at her SNF with subsequent right hip pain when she was given an attempt to stand by the staff.  She was noted to have shortened right leg with external rotation.   ED course.  Vital stable. Labs revealed blood glucose of 294.  High sensitive troponin was 605.  CBC showed thrombocytopenia of 123 with otherwise unremarkable levels.  Blood group was A+ with NTE antibodies.  UA was positive for UTI. EKG as reviewed by me : Revealed normal sinus rhythm with a rate of 84 with Q waves septally.   Imaging: Portable chest ray showed no acute cardiopulmonary disease.  It did show aortic atherosclerosis.  Right hip x-ray showed comminuted intertrochanteric proximal right femoral fracture with the main distal fragment impacted and anteriorly displaced by about 300 of the shaft width.  It showed irregular sclerotic lesion in the proximal right femoral shaft probably a chondroid lesion or old bone infarct.  It also shows sclerotic metastasis were not strictly excluded.  Pelvic x-ray also showed left hip AVN and secondary DJD.   The patient was given fentanyl 50 mcg and Zofran 4 mg IV as well as 1 g of IV Rocephin and hydration with IV normal saline at 75 mill per hour. Dr. Harlow Mares from orthopedic surgery was consulted.  12/28: Blood pressure mildly elevated at 150/61.  Pending urine cultures and orthopedic consult. Going to OR later today.  Giving 1 dose of Ativan due to excessive anxiety.  12/29: Vital stable.  S/p ORIF yesterday.  Tolerated the procedure well  CBG elevated above 200, checking A1c and switching her  to SSI with meals, add 3 units with meal and Semglee 5 units twice daily.  Mild pseudo hyponatremia secondary to hyperglycemia.  Hemoglobin decreased to 11.2 after the surgery.  Starting her on iron supplement. She desaturated requiring up to 2 L of oxygen while working with PT, no baseline oxygen use. PT and OT are recommending SNF  12/30: Hemodynamically stable.  Patient again became hypoxic to high 80s with ambulation, mild need to go to SNF with some oxygen.  Urine cultures with E. coli-pending susceptibility.  CBG mildly elevated.   Assessment and Plan: * Closed right hip fracture ([place]) Secondary to unwitnessed fall at her facility, s/p ORIF. -Continue with pain management -PT/OT are recommending SNF  Type 2 diabetes mellitus with hyperglycemia, without long-term current use of insulin (HCC) CBG elevated today, she was on metformin at home which is being currently held - Recheck A1c -Add low-dose Semglee -Add 3 units with meal if he eats more than 50% -SSI  Acute lower UTI Urine cultures with E. coli-pending susceptibility - Continue with Rocephin for now  Depression - We will continue Zoloft and trazodone.  Dyslipidemia - Statin therapy will be resumed.  Protein-calorie malnutrition, severe Estimated body mass index is 20.74 kg/m as calculated from the following:   Height as of this encounter: '5\' 3"'$  (1.6 m).   Weight as of this encounter: 53.1 kg.   -Dietitian consult    Subjective: Patient was little somnolent as she received pain medications after working with PT.  No  new complaints.  Physical Exam: Vitals:   07/04/22 0903 07/04/22 1600 07/04/22 2345 07/05/22 0821  BP: (!) 122/45 115/60 (!) 120/58 (!) 106/49  Pulse: 85 75 78 72  Resp: '16 18 18 18  '$ Temp: 98.8 F (37.1 C) 98.4 F (36.9 C) 98.6 F (37 C) 98.1 F (36.7 C)  TempSrc:  Axillary    SpO2: 91%  (!) 88% 92%  Weight:      Height:       General.  Frail and malnourished elderly lady, in no acute  distress. Pulmonary.  Lungs clear bilaterally, normal respiratory effort. CV.  Regular rate and rhythm, no JVD, rub or murmur. Abdomen.  Soft, nontender, nondistended, BS positive. CNS.  Somnolent.  No focal neurologic deficit. Extremities.  No edema, no cyanosis, pulses intact and symmetrical. Psychiatry.  Judgment and insight appears impaired  Data Reviewed: Prior data reviewed  Family Communication: Discussed with son at bedside  Disposition: Status is: Inpatient Remains inpatient appropriate because: Severity of illness  Planned Discharge Destination: Skilled nursing facility  Time spent: 41 minutes  This record has been created using Systems analyst. Errors have been sought and corrected,but may not always be located. Such creation errors do not reflect on the standard of care.   Author: Lorella Nimrod, MD 07/05/2022 3:08 PM  For on call review [email or web address].

## 2022-07-05 NOTE — Progress Notes (Signed)
Subjective: 2 Days Post-Op Procedure(s) (LRB): INTRAMEDULLARY (IM) NAIL INTERTROCHANTERIC (Right) Patient is awake and alert.  Family is at the bedside.  Dressings are dry on the right.  Hemoglobin stable.  Patient reports pain as mild.  Objective:   VITALS:   Vitals:   07/04/22 2345 07/05/22 0821  BP: (!) 120/58 (!) 106/49  Pulse: 78 72  Resp: 18 18  Temp: 98.6 F (37 C) 98.1 F (36.7 C)  SpO2: (!) 88% 92%    Neurologically intact Incision: dressing C/D/I  LABS Recent Labs    07/02/22 2328 07/04/22 0747  HGB 12.1 11.2*  HCT 37.3 34.3*  WBC 8.9 8.2  PLT 123* 129*    Recent Labs    07/02/22 2328 07/04/22 0747  NA 135 134*  K 4.1 4.1  BUN 13 9  CREATININE 0.64 0.50  GLUCOSE 294* 239*    No results for input(s): "LABPT", "INR" in the last 72 hours.   Assessment/Plan: 2 Days Post-Op Procedure(s) (LRB): INTRAMEDULLARY (IM) NAIL INTERTROCHANTERIC (Right)   Up with therapy Discharge to SNF when medically stable

## 2022-07-05 NOTE — TOC Progression Note (Signed)
Transition of Care [facility]) - Progression Note    Patient Details  Name: Terry Watson MRN: [id number] Date of Birth: 08-Oct-1945  Transition of Care [facility]) CM/SW [place], LCSW Phone Number: 07/05/2022, 9:01 AM  Clinical Narrative:    Awaiting bed offers. Asked Magda Paganini at [facility] to review referral as this is family's preference.     Barriers to Discharge: Continued Medical Work up  Expected Discharge Plan and Services       Living arrangements for the past 2 months: [place]                                       Social Determinants of Health (SDOH) Interventions SDOH Screenings   Food Insecurity: No Food Insecurity (07/03/2022)  Housing: Low Risk  (07/03/2022)  Transportation Needs: No Transportation Needs (01/21/2022)  Utilities: Not At Risk (07/03/2022)  Alcohol Screen: Low Risk  (01/21/2022)  Depression (PHQ2-9): Medium Risk (02/05/2022)  Financial Resource Strain: Low Risk  (01/21/2022)  Physical Activity: Insufficiently Active (01/21/2022)  Social Connections: Moderately Isolated (01/21/2022)  Stress: No Stress Concern Present (01/21/2022)  Tobacco Use: Low Risk  (07/04/2022)    Readmission Risk Interventions     No data to display

## 2022-07-05 NOTE — Progress Notes (Signed)
Physical Therapy Treatment Patient Details Name: Terry Watson MRN: [id number] DOB: Feb 05, 1946 Today's Date: 07/05/2022   History of Present Illness Terry Watson is a 76 y.o. Caucasian female with medical history significant for type 2 diabetes mellitus, dementia, dyslipidemia and osteoporosis, who presented to the ER with acute onset of unwitnessed fall at her SNF with subsequent right hip pain when she was given an attempt to stand by the staff. Pt s/p R hip IM nailing 07/03/22.    PT Comments    Pt more awake today but still minimally interactive needing a lot of cuing and typically phyiscal assist to initiate movements.  That being said she was able to show some effort with R LE exercises, assisting transition to sitting and clearly trying to help with getting to and maintaining standing though R LE WBing was a very big limiter and she could not attain/maintain midline standing even with heavy assist and cuing.  Pt able to do more than prior sessions but still very limited, will maintain on PT caseload attempting to maintain QD frequency - per pt tolerance.  Recommendations for follow up therapy are one component of a multi-disciplinary discharge planning process, led by the attending physician.  Recommendations may be updated based on patient status, additional functional criteria and insurance authorization.  Follow Up Recommendations  Skilled nursing-short term rehab (<3 hours/day) Can patient physically be transported by private vehicle: No   Assistance Recommended at Discharge Frequent or constant Supervision/Assistance  Patient can return home with the following A lot of help with bathing/dressing/bathroom;Two people to help with walking and/or transfers;Assist for transportation;Help with stairs or ramp for entrance;A lot of help with walking and/or transfers   Equipment Recommendations  Other (comment) (TBD)    Recommendations for Other Services       Precautions /  Restrictions Precautions Precautions: Fall Restrictions RLE Weight Bearing: Weight bearing as tolerated     Mobility  Bed Mobility Overal bed mobility: Needs Assistance Bed Mobility: Supine to Sit, Sit to Supine     Supine to sit: Mod assist Sit to supine: Max assist   General bed mobility comments: Pt struggled to initiate movement with verbal cuing, needed heavy assist to start and maintain movement but with HHA and plenty of cuing she did show some effort getting up despite clearly being in pain.  Heavy assist with trunk and LEs to get back to supine    Transfers Overall transfer level: Needs assistance Equipment used: Rolling walker (2 wheels) Transfers: Sit to/from Stand Sit to Stand: Total assist           General transfer comment: Heavy cuing for set up and to initiate movement, pt leaning away from the R, hesitant to put much weight through it.  Difficulty appropriately using RW to distribute weight or maintain upright; constant assist to keep from calling back and to the L.    Ambulation/Gait               General Gait Details: unable/unsafe to attempt today   Stairs             Wheelchair Mobility    Modified Rankin (Stroke Patients Only)       Balance Overall balance assessment: Needs assistance Sitting-balance support: Bilateral upper extremity supported, Feet supported Sitting balance-Leahy Scale: Fair Sitting balance - Comments: Initial posterior leaning and left lateral lean. With time in sitting able to sit upright unsupported.     Standing balance-Leahy Scale: Zero Standing balance comment: leaning  L, minimal R WBing tolerance and minimal awareness for appropriate RW use                            Cognition Arousal/Alertness: Awake/alert Behavior During Therapy: Flat affect Overall Cognitive Status: History of cognitive impairments - at baseline                                 General Comments: Eyes  open, minimally interactive, per family member likely close to baseline        Exercises General Exercises - Lower Extremity Ankle Circles/Pumps: AAROM, 5 reps Quad Sets: Strengthening, Right, 10 reps Heel Slides: AAROM, 5 reps, Right Hip ABduction/ADduction: AAROM, 5 reps, Right    General Comments General comments (skin integrity, edema, etc.): Pt with flat affect throughout, but with heavy encouargement/cuing did show effort with exercises and mobility      Pertinent Vitals/Pain Pain Assessment Pain Assessment: Faces Faces Pain Scale: Hurts little more Pain Location: R hip    Home Living                          Prior Function            PT Goals (current goals can now be found in the care plan section) Progress towards PT goals: Progressing toward goals    Frequency    7X/week      PT Plan Frequency needs to be updated    Co-evaluation              AM-PAC PT "6 Clicks" Mobility   Outcome Measure  Help needed turning from your back to your side while in a flat bed without using bedrails?: Total Help needed moving from lying on your back to sitting on the side of a flat bed without using bedrails?: Total Help needed moving to and from a bed to a chair (including a wheelchair)?: Total Help needed standing up from a chair using your arms (e.g., wheelchair or bedside chair)?: Total Help needed to walk in hospital room?: Total Help needed climbing 3-5 steps with a railing? : Total 6 Click Score: 6    End of Session   Activity Tolerance: Patient limited by pain (cognition limiting activity tolerance) Patient left: with bed alarm set;with call bell/phone within reach;with family/visitor present Nurse Communication: Mobility status PT Visit Diagnosis: Unsteadiness on feet (R26.81);Other abnormalities of gait and mobility (R26.89);Difficulty in walking, not elsewhere classified (R26.2);Muscle weakness (generalized) (M62.81)     Time:  6659-9357 PT Time Calculation (min) (ACUTE ONLY): 28 min  Charges:  $Therapeutic Exercise: 8-22 mins $Therapeutic Activity: 8-22 mins                     Kreg Shropshire, DPT 07/05/2022, 1:42 PM

## 2022-07-06 DIAGNOSIS — R338 Other retention of urine: Secondary | ICD-10-CM | POA: Insufficient documentation

## 2022-07-06 DIAGNOSIS — S72001A Fracture of unspecified part of neck of right femur, initial encounter for closed fracture: Secondary | ICD-10-CM | POA: Diagnosis not present

## 2022-07-06 LAB — TYPE AND SCREEN
ABO/RH(D): A POS
Antibody Screen: POSITIVE
PT AG Type: POSITIVE
Unit division: 0
Unit division: 0

## 2022-07-06 LAB — URINE CULTURE: Culture: >=100000 — AB

## 2022-07-06 LAB — GLUCOSE, CAPILLARY
Glucose-Capillary: 225 mg/dL — ABNORMAL HIGH (ref 70–99)
Glucose-Capillary: 255 mg/dL — ABNORMAL HIGH (ref 70–99)
Glucose-Capillary: 252 mg/dL — ABNORMAL HIGH (ref 70–99)
Glucose-Capillary: 191 mg/dL — ABNORMAL HIGH (ref 70–99)

## 2022-07-06 LAB — BPAM RBC
Blood Product Expiration Date: 202401182359
Blood Product Expiration Date: 202401242359
Unit Type and Rh: 5100
Unit Type and Rh: 5100

## 2022-07-06 MED ORDER — INSULIN ASPART 100 UNIT/ML IJ SOLN
4.0000 [IU] | Freq: Three times a day (TID) | INTRAMUSCULAR | Status: DC
  Administered 2022-07-06 – 2022-07-08 (×7): 4 [IU] via SUBCUTANEOUS
  Filled 2022-07-06 (×7): qty 1

## 2022-07-06 MED ORDER — CEPHALEXIN 500 MG PO CAPS
500.0000 mg | ORAL_CAPSULE | Freq: Three times a day (TID) | ORAL | Status: DC
  Administered 2022-07-07 – 2022-07-08 (×5): 500 mg via ORAL
  Filled 2022-07-06 (×5): qty 1

## 2022-07-06 MED ORDER — INSULIN GLARGINE-YFGN 100 UNIT/ML ~~LOC~~ SOLN
8.0000 [IU] | Freq: Two times a day (BID) | SUBCUTANEOUS | Status: DC
  Administered 2022-07-06 – 2022-07-08 (×5): 8 [IU] via SUBCUTANEOUS
  Filled 2022-07-06 (×5): qty 0.08

## 2022-07-06 NOTE — Progress Notes (Signed)
Progress Note   Patient: Terry Watson VOZ:[id number] DOB: 05-06-1946 DOA: 07/03/2022     3 DOS: the patient was seen and examined on 07/06/2022   Brief hospital course: Taken from H&P.  Terry Watson is a 76 y.o. Caucasian female with medical history significant for type 2 diabetes mellitus, dementia, dyslipidemia and osteoporosis, who presented to the ER with acute onset of unwitnessed fall at her SNF with subsequent right hip pain when she was given an attempt to stand by the staff.  She was noted to have shortened right leg with external rotation.   ED course.  Vital stable. Labs revealed blood glucose of 294.  High sensitive troponin was 605.  CBC showed thrombocytopenia of 123 with otherwise unremarkable levels.  Blood group was A+ with NTE antibodies.  UA was positive for UTI. EKG as reviewed by me : Revealed normal sinus rhythm with a rate of 84 with Q waves septally.   Imaging: Portable chest ray showed no acute cardiopulmonary disease.  It did show aortic atherosclerosis.  Right hip x-ray showed comminuted intertrochanteric proximal right femoral fracture with the main distal fragment impacted and anteriorly displaced by about 300 of the shaft width.  It showed irregular sclerotic lesion in the proximal right femoral shaft probably a chondroid lesion or old bone infarct.  It also shows sclerotic metastasis were not strictly excluded.  Pelvic x-ray also showed left hip AVN and secondary DJD.   The patient was given fentanyl 50 mcg and Zofran 4 mg IV as well as 1 g of IV Rocephin and hydration with IV normal saline at 75 mill per hour. Dr. Harlow Mares from orthopedic surgery was consulted.  12/28: Blood pressure mildly elevated at 150/61.  Pending urine cultures and orthopedic consult. Going to OR later today.  Giving 1 dose of Ativan due to excessive anxiety.  12/29: Vital stable.  S/p ORIF yesterday.  Tolerated the procedure well  CBG elevated above 200, checking A1c and switching her  to SSI with meals, add 3 units with meal and Semglee 5 units twice daily.  Mild pseudo hyponatremia secondary to hyperglycemia.  Hemoglobin decreased to 11.2 after the surgery.  Starting her on iron supplement. She desaturated requiring up to 2 L of oxygen while working with PT, no baseline oxygen use. PT and OT are recommending SNF  12/30: Hemodynamically stable.  Patient again became hypoxic to high 80s with ambulation, mild need to go to SNF with some oxygen.  Urine cultures with E. coli-pending susceptibility.  CBG mildly elevated.  12/31: Hemodynamically stable.  CBG remained elevated, increasing Semglee to 8 units twice daily and mealtime coverage to 4 units.  Urine culture with E. coli with good sensitivity, only resistant to ampicillin, sulbactam and ciprofloxacin.  Switching antibiotics to Keflex to complete a 5-day course.   Assessment and Plan: * Closed right hip fracture ([place]) Secondary to unwitnessed fall at her facility, s/p ORIF. -Continue with pain management -PT/OT are recommending SNF  Type 2 diabetes mellitus with hyperglycemia, without long-term current use of insulin (HCC) CBG elevated today, she was on metformin at home which is being currently held - A1c elevated at 8.8 -Increase Semglee to 8 -Increase mealtime coverage to 4 units -SSI  Acute lower UTI Urine cultures with E. coli-good sensitivity, only resistance to ampicillin, sulbactam and Cipro - Switch antibiotics to Keflex to complete a 5-day course.  Depression - We will continue Zoloft and trazodone.  Dyslipidemia - Statin therapy will be resumed.  Protein-calorie malnutrition,  severe Estimated body mass index is 20.74 kg/m as calculated from the following:   Height as of this encounter: '5\' 3"'$  (1.6 m).   Weight as of this encounter: 53.1 kg.   -Dietitian consult    Subjective: Patient denies any significant pain.  Sitting in chair comfortably and able to participate with PT.  Physical  Exam: Vitals:   07/05/22 0821 07/06/22 0152 07/06/22 0909 07/06/22 1540  BP: (!) 106/49 (!) 151/74 127/61 (!) 129/58  Pulse: 72 93 85 86  Resp: '18 18 17 17  '$ Temp: 98.1 F (36.7 C) (!) 97.5 F (36.4 C) 99.3 F (37.4 C) 98.2 F (36.8 C)  TempSrc:      SpO2: 92% 94% 97% 93%  Weight:      Height:       General.  Frail elderly lady, in no acute distress. Pulmonary.  Lungs clear bilaterally, normal respiratory effort. CV.  Regular rate and rhythm, no JVD, rub or murmur. Abdomen.  Soft, nontender, nondistended, BS positive. CNS.  Alert and oriented .  No focal neurologic deficit. Extremities.  No edema, no cyanosis, pulses intact and symmetrical. Psychiatry.  Judgment and insight appears impaired  Data Reviewed: Prior data reviewed  Family Communication: Discussed with son and DIL at bedside  Disposition: Status is: Inpatient Remains inpatient appropriate because: Severity of illness  Planned Discharge Destination: Skilled nursing facility  Time spent: 40 minutes  This record has been created using Systems analyst. Errors have been sought and corrected,but may not always be located. Such creation errors do not reflect on the standard of care.   Author: Lorella Nimrod, MD 07/06/2022 5:11 PM  For on call review [email or web address].

## 2022-07-06 NOTE — Assessment & Plan Note (Deleted)
Patient with urinary retention and requiring few times of in and out catheter. Per patient she was experiencing difficulty urination for a while and becoming incontinent at time.  May be overflow incontinence.  She has not seen a urologist. -Place Foley catheter -Patient will need outpatient urology evaluation

## 2022-07-06 NOTE — Progress Notes (Signed)
Physical Therapy Treatment Patient Details Name: Terry Watson MRN: [id number] DOB: [birth year] Today's Date: 07/06/2022   History of Present Illness Terry Watson is a 76 y.o. Caucasian female with medical history significant for type 2 diabetes mellitus, dementia, dyslipidemia and osteoporosis, who presented to the ER with acute onset of unwitnessed fall at her SNF with subsequent right hip pain when she was given an attempt to stand by the staff. Pt s/p R hip IM nailing 07/03/22.    PT Comments    Pt was able to participate much better this date with more volitional/AROM effort with exercises, better ability to tolerate weight on the R and was able to ambulate >10 ft.  Pt continues to have baseline confusion but follow cues for exercises much better today and though she still needed a lot of assist to get to sitting EOB she was even able to stand w/o direct assist on one effort from only minimally elevated bed height.  Pt still with some hesitancy with WBing on the R but overall showed ability to take weight and ambulate with UE/walker assist around the foot of the bed.  Pt did exceedingly better than yesterday and is now showing much improved ability to meaningfully participate with PT.  Pt making good gains, continue with POC.  Recommendations for follow up therapy are one component of a multi-disciplinary discharge planning process, led by the attending physician.  Recommendations may be updated based on patient status, additional functional criteria and insurance authorization.  Follow Up Recommendations  Skilled nursing-short term rehab (<3 hours/day) Can patient physically be transported by private vehicle: No   Assistance Recommended at Discharge Frequent or constant Supervision/Assistance  Patient can return home with the following A lot of help with bathing/dressing/bathroom;Two people to help with walking and/or transfers;Assist for transportation;Help with stairs or ramp for  entrance;A lot of help with walking and/or transfers   Equipment Recommendations   (TBD)    Recommendations for Other Services       Precautions / Restrictions Precautions Precautions: Fall Restrictions Weight Bearing Restrictions: Yes RLE Weight Bearing: Weight bearing as tolerated     Mobility  Bed Mobility Overal bed mobility: Needs Assistance Bed Mobility: Supine to Sit     Supine to sit: Mod assist     General bed mobility comments: Pt showed some effort with getting hips/LEs toward EOB but ultimately needed considerable assist with trunk and LEs to get to sitting    Transfers Overall transfer level: Needs assistance Equipment used: Rolling walker (2 wheels) Transfers: Sit to/from Stand Sit to Stand: Min assist, Min guard           General transfer comment: 2 standing bouts, much improved from previous sessions.  Pt continues to need explicit cues for hand placement/set up and plenty of encouragement but on first attempt she was able to rise with only min assist and on second attempt (from ~2" elevated bed) she was able to rise w/o direct assist.  Pt still hesitant with WBing on the R, but did not have the severe aversion that she has previously.    Ambulation/Gait Ambulation/Gait assistance: Min assist, Mod assist Gait Distance (Feet): 12 Feet Assistive device: Rolling walker (2 wheels)         General Gait Details: Pt showed surprising ability to take weight through R LE during ambulation.  She needed constant and explicit cuing but with heavy UE use on walker was able to walk around the foot of the bed with effortful  work.   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[place] (Stroke Patients Only)       Balance Overall balance assessment: Needs assistance Sitting-balance support: Bilateral upper extremity supported, Feet supported Sitting balance-Leahy Scale: Fair Sitting balance - Comments: Able to maintain sitting balance w/o  direct assist once helped to getting square at EOB                                    Cognition Arousal/Alertness: Awake/alert Behavior During Therapy: Flat affect Overall Cognitive Status: History of cognitive impairments - at baseline                                 General Comments: Pt still with significant baseline confusion but better able to follow instructions/cuing today and showed much improved volitional movement        Exercises General Exercises - Lower Extremity Quad Sets: Strengthening, 10 reps Short Arc Quad: AAROM, AROM, 10 reps Heel Slides: AAROM, AROM (with lightly reissted leg ext) Hip ABduction/ADduction: Strengthening, 10 reps    General Comments General comments (skin integrity, edema, etc.): Pt did much better today with more AROM effort during exercises, able to rise to standing and maintain balance, ability to functionally take weight through the R LE, and >10 ft of ambulation.      Pertinent Vitals/Pain Pain Assessment Pain Assessment: Faces Faces Pain Scale: Hurts little more Pain Location: R hip - pt indicating minimal rest pain, does display guarding and hesitancy with any movement.    Home Living                          Prior Function            PT Goals (current goals can now be found in the care plan section) Progress towards PT goals: Progressing toward goals    Frequency    7X/week      PT Plan Frequency needs to be updated    Co-evaluation              AM-PAC PT "6 Clicks" Mobility   Outcome Measure  Help needed turning from your back to your side while in a flat bed without using bedrails?: Total Help needed moving from lying on your back to sitting on the side of a flat bed without using bedrails?: Total Help needed moving to and from a bed to a chair (including a wheelchair)?: Total Help needed standing up from a chair using your arms (e.g., wheelchair or bedside chair)?:  Total Help needed to walk in hospital room?: Total Help needed climbing 3-5 steps with a railing? : Total 6 Click Score: 6    End of Session Equipment Utilized During Treatment: Oxygen Activity Tolerance: Patient limited by pain Patient left: with bed alarm set;with call bell/phone within reach;with family/visitor present Nurse Communication: Mobility status PT Visit Diagnosis: Unsteadiness on feet (R26.81);Other abnormalities of gait and mobility (R26.89);Difficulty in walking, not elsewhere classified (R26.2);Muscle weakness (generalized) (M62.81)     Time: 4008-6761 PT Time Calculation (min) (ACUTE ONLY): 33 min  Charges:  $Gait Training: 8-22 mins $Therapeutic Exercise: 8-22 mins                     Klaire Court R  Montine Circle, DPT 07/06/2022, 1:44 PM

## 2022-07-06 NOTE — TOC Progression Note (Signed)
Transition of Care [facility]) - Progression Note    Patient Details  Name: Terry Watson MRN: [id number] Date of Birth: 1946-04-06  Transition of Care [facility]) CM/SW [place], LCSW Phone Number: 07/06/2022, 11:12 AM  Clinical Narrative:    Patient has a bed offer at [facility]. CSW reached out to [place] in Admissions to inquire if they can take patient on Tuesday (they are not taking admissions on Monday due to holiday), awaiting response.      Barriers to Discharge: Continued Medical Work up  Expected Discharge Plan and Services       Living arrangements for the past 2 months: [place]                                       Social Determinants of Health (SDOH) Interventions SDOH Screenings   Food Insecurity: No Food Insecurity (07/03/2022)  Housing: Low Risk  (07/03/2022)  Transportation Needs: No Transportation Needs (01/21/2022)  Utilities: Not At Risk (07/03/2022)  Alcohol Screen: Low Risk  (01/21/2022)  Depression (PHQ2-9): Medium Risk (02/05/2022)  Financial Resource Strain: Low Risk  (01/21/2022)  Physical Activity: Insufficiently Active (01/21/2022)  Social Connections: Moderately Isolated (01/21/2022)  Stress: No Stress Concern Present (01/21/2022)  Tobacco Use: Low Risk  (07/04/2022)    Readmission Risk Interventions     No data to display

## 2022-07-06 NOTE — Progress Notes (Signed)
Occupational Therapy Treatment Patient Details Name: Terry Watson MRN: [id number] DOB: 04-Jun-1946 Today's Date: 07/06/2022   History of present illness Terry Watson is a 76 y.o. Caucasian female with medical history significant for type 2 diabetes mellitus, dementia, dyslipidemia and osteoporosis, who presented to the ER with acute onset of unwitnessed fall at her SNF with subsequent right hip pain when she was given an attempt to stand by the staff. Pt s/p R hip IM nailing 07/03/22.   OT comments  Chart reviewed, pt greeted in bed agreeable to brief OT tx session with encouragement. Pt reporting pain ,unable to provide number however yelling out in pain with bed mobility. Pt is making slow progress towards goals, tx session ended early on this date with pt reporting "I just want to sleep" and declining further participation in multiple ADL tasks provided. Slow progress appears to be noted in cognition, functional status compared to OT evaluation. Discharge recommendation remains appropriate .   Recommendations for follow up therapy are one component of a multi-disciplinary discharge planning process, led by the attending physician.  Recommendations may be updated based on patient status, additional functional criteria and insurance authorization.    Follow Up Recommendations  Skilled nursing-short term rehab (<3 hours/day)     Assistance Recommended at Discharge Frequent or constant Supervision/Assistance  Patient can return home with the following  Two people to help with walking and/or transfers;Two people to help with bathing/dressing/bathroom   Equipment Recommendations  Other (comment) (per next venue of care)    Recommendations for Other Services      Precautions / Restrictions Precautions Precautions: Fall Restrictions Weight Bearing Restrictions: Yes RLE Weight Bearing: Weight bearing as tolerated       Mobility Bed Mobility Overal bed mobility: Needs  Assistance Bed Mobility: Supine to Sit, Sit to Supine     Supine to sit: Mod assist, Max assist Sit to supine: Mod assist, Max assist   General bed mobility comments: pt resisitve to movement, reports she would like to sleep    Transfers                         Balance                                           ADL either performed or assessed with clinical judgement   ADL Overall ADL's : Needs assistance/impaired     Grooming: Wash/dry face;Wash/dry hands;Sitting;Supervision/safety               Lower Body Dressing: Maximal assistance       Toileting- Clothing Manipulation and Hygiene: Maximal assistance              Extremity/Trunk Assessment              Vision       Perception     Praxis      Cognition Arousal/Alertness: Awake/alert Behavior During Therapy: Flat affect Overall Cognitive Status: No family/caregiver present to determine baseline cognitive functioning Area of Impairment: Orientation, Attention, Memory, Following commands, Safety/judgement, Awareness                 Orientation Level: Disoriented to, Place, Time, Situation Current Attention Level: Focused Memory: Decreased short-term memory Following Commands: Follows one step commands with increased time, Follows one step commands inconsistently Safety/Judgement: Decreased awareness of deficits, Decreased  awareness of safety Awareness: Intellectual            Exercises      Shoulder Instructions       General Comments Pt did much better today with more AROM effort during exercises, able to rise to standing and maintain balance, ability to functionally take weight through the R LE, and >10 ft of ambulation.    Pertinent Vitals/ Pain       Pain Assessment Pain Assessment: PAINAD Breathing: normal Negative Vocalization: occasional moan/groan, low speech, negative/disapproving quality Facial Expression: sad, frightened, frown Body  Language: tense, distressed pacing, fidgeting Consolability: distracted or reassured by voice/touch PAINAD Score: 4 Pain Location: R hip with attempted mobility Pain Descriptors / Indicators: Discomfort Pain Intervention(s): Limited activity within patient's tolerance, Monitored during session, Repositioned  Home Living                                          Prior Functioning/Environment              Frequency  Min 2X/week        Progress Toward Goals  OT Goals(current goals can now be found in the care plan section)  Progress towards OT goals: Progressing toward goals     Plan Discharge plan remains appropriate    Co-evaluation                 AM-PAC OT "6 Clicks" Daily Activity     Outcome Measure   Help from another person eating meals?: A Little Help from another person taking care of personal grooming?: A Little Help from another person toileting, which includes using toliet, bedpan, or urinal?: Total Help from another person bathing (including washing, rinsing, drying)?: Total Help from another person to put on and taking off regular upper body clothing?: A Lot Help from another person to put on and taking off regular lower body clothing?: Total 6 Click Score: 11    End of Session    OT Visit Diagnosis: Unsteadiness on feet (R26.81);Muscle weakness (generalized) (M62.81);Repeated falls (R29.6)   Activity Tolerance Patient limited by pain   Patient Left in bed;with call bell/phone within reach;with bed alarm set   Nurse Communication Mobility status        Time: 1400-1408 OT Time Calculation (min): 8 min  Charges: OT General Charges $OT Visit: 1 Visit OT Treatments $Self Care/Home Management : 8-22 mins  Shanon Payor, OTD OTR/L  07/06/22, 2:15 PM

## 2022-07-07 DIAGNOSIS — S72001A Fracture of unspecified part of neck of right femur, initial encounter for closed fracture: Secondary | ICD-10-CM | POA: Diagnosis not present

## 2022-07-07 LAB — GLUCOSE, CAPILLARY
Glucose-Capillary: 181 mg/dL — ABNORMAL HIGH (ref 70–99)
Glucose-Capillary: 361 mg/dL — ABNORMAL HIGH (ref 70–99)
Glucose-Capillary: 292 mg/dL — ABNORMAL HIGH (ref 70–99)
Glucose-Capillary: 303 mg/dL — ABNORMAL HIGH (ref 70–99)
Glucose-Capillary: 278 mg/dL — ABNORMAL HIGH (ref 70–99)

## 2022-07-07 MED ORDER — INSULIN ASPART 100 UNIT/ML IJ SOLN
5.0000 [IU] | Freq: Once | INTRAMUSCULAR | Status: AC
  Administered 2022-07-07: 5 [IU] via SUBCUTANEOUS
  Filled 2022-07-07: qty 1

## 2022-07-07 MED ORDER — ACETAMINOPHEN 325 MG PO TABS: 650.0000 mg | ORAL_TABLET | Freq: Four times a day (QID) | ORAL | Status: DC | PRN

## 2022-07-07 NOTE — Progress Notes (Signed)
Physical Therapy Treatment Patient Details Name: Terry Watson MRN: [id number] DOB: May 04, 1946 Today's Date: 07/07/2022   History of Present Illness Terry Watson is a 77 y.o. Caucasian female with medical history significant for type 2 diabetes mellitus, dementia, dyslipidemia and osteoporosis, who presented to the ER with acute onset of unwitnessed fall at her SNF with subsequent right hip pain when she was given an attempt to stand by the staff. Pt s/p R hip IM nailing 07/03/22.    PT Comments    Pt continues to make good improvements.  She was able to ambulate ~60 ft with consistent cuing and despite her confusion was able to get into a relatively consistent cadence with light assist to maintain walker momentum and plenty of encouragement.  Pt able to do some of the exercises with purposeful AROM, a few she needed AAROM to maintain reps.  Overall pt has made significant improvements in the last few days. Continues with POC and STR, plan to increase frequency back to BID as she has showed much improved participation.     Recommendations for follow up therapy are one component of a multi-disciplinary discharge planning process, led by the attending physician.  Recommendations may be updated based on patient status, additional functional criteria and insurance authorization.  Follow Up Recommendations  Skilled nursing-short term rehab (<3 hours/day) Can patient physically be transported by private vehicle: No   Assistance Recommended at Discharge Frequent or constant Supervision/Assistance  Patient can return home with the following A lot of help with bathing/dressing/bathroom;Two people to help with walking and/or transfers;Assist for transportation;Help with stairs or ramp for entrance;A lot of help with walking and/or transfers   Equipment Recommendations  Other (comment) (TBD)    Recommendations for Other Services       Precautions / Restrictions Precautions Precautions:  Fall Restrictions RLE Weight Bearing: Weight bearing as tolerated     Mobility  Bed Mobility               General bed mobility comments: in recliner on arrival    Transfers Overall transfer level: Needs assistance Equipment used: Rolling walker (2 wheels) Transfers: Sit to/from Stand Sit to Stand: Min guard           General transfer comment: Pt was able to rise to standing w/o direct assist; however needed heavy cuing with hand placement and set up.    Ambulation/Gait Ambulation/Gait assistance: Min guard, Min assist Gait Distance (Feet): 60 Feet Assistive device: Rolling walker (2 wheels)         General Gait Details: Pt was able to ambulate w/o excessive hesitation/limp on the R.  She was slow and deliberate but with light direct assist to consistently advance walker she was able to improve cadence and speed.  Pt's O2 did drop minimally to the effort, but was in the low 90s after the effort.   Stairs             Wheelchair Mobility    Modified Rankin (Stroke Patients Only)       Balance Overall balance assessment: Needs assistance Sitting-balance support: Bilateral upper extremity supported, Feet supported Sitting balance-Leahy Scale: Fair       Standing balance-Leahy Scale: Fair Standing balance comment: Pt much better able to maintain standing with walker this date.  No LOBs but need for consistent cuing for appropriate AD use.  Cognition Arousal/Alertness: Awake/alert Behavior During Therapy: Flat affect Overall Cognitive Status: History of cognitive impairments - at baseline                                 General Comments: family reports that she seems much closer to her baseline (still significantly confused) today        Exercises General Exercises - Lower Extremity Quad Sets: Strengthening, 10 reps Long Arc Quad: AROM, AAROM, 10 reps Heel Slides: AAROM, AROM, 10 reps Hip  ABduction/ADduction: Strengthening, AROM, 10 reps Hip Flexion/Marching: AAROM, 10 reps    General Comments        Pertinent Vitals/Pain Pain Assessment Pain Assessment: Faces Faces Pain Scale: Hurts a little bit Pain Location: pain indicates little pain at rest, some minimal increase with exercises,    Home Living                          Prior Function            PT Goals (current goals can now be found in the care plan section) Progress towards PT goals: Progressing toward goals    Frequency    BID      PT Plan Frequency needs to be updated    Co-evaluation              AM-PAC PT "6 Clicks" Mobility   Outcome Measure  Help needed turning from your back to your side while in a flat bed without using bedrails?: A Lot Help needed moving from lying on your back to sitting on the side of a flat bed without using bedrails?: A Lot Help needed moving to and from a bed to a chair (including a wheelchair)?: A Little Help needed standing up from a chair using your arms (e.g., wheelchair or bedside chair)?: A Little Help needed to walk in hospital room?: A Little Help needed climbing 3-5 steps with a railing? : A Lot 6 Click Score: 15    End of Session Equipment Utilized During Treatment: Gait belt Activity Tolerance: Patient tolerated treatment well Patient left: with family/visitor present;with chair alarm set;with call bell/phone within reach Nurse Communication: Mobility status PT Visit Diagnosis: Unsteadiness on feet (R26.81);Other abnormalities of gait and mobility (R26.89);Difficulty in walking, not elsewhere classified (R26.2);Muscle weakness (generalized) (M62.81)     Time: 2330-0762 PT Time Calculation (min) (ACUTE ONLY): 31 min  Charges:  $Gait Training: 8-22 mins $Therapeutic Exercise: 8-22 mins                     Kreg Shropshire, DPT 07/07/2022, 9:19 AM

## 2022-07-07 NOTE — Plan of Care (Signed)
  Problem: Education: Goal: Ability to describe self-care measures that may prevent or decrease complications (Diabetes Survival Skills Education) will improve Outcome: Progressing   Problem: Skin Integrity: Goal: Risk for impaired skin integrity will decrease Outcome: Progressing   Problem: Tissue Perfusion: Goal: Adequacy of tissue perfusion will improve Outcome: Progressing   Problem: Pain Managment: Goal: General experience of comfort will improve Outcome: Progressing   Problem: Safety: Goal: Ability to remain free from injury will improve Outcome: Progressing   Problem: Skin Integrity: Goal: Risk for impaired skin integrity will decrease Outcome: Progressing

## 2022-07-07 NOTE — Progress Notes (Signed)
Progress Note   Patient: Terry Watson ULA:[id number] DOB: 1946/01/06 DOA: 07/03/2022     4 DOS: the patient was seen and examined on 07/07/2022   Brief hospital course: Taken from H&P.  Terry Watson is a 77 y.o. Caucasian female with medical history significant for type 2 diabetes mellitus, dementia, dyslipidemia and osteoporosis, who presented to the ER with acute onset of unwitnessed fall at her SNF with subsequent right hip pain when she was given an attempt to stand by the staff.  She was noted to have shortened right leg with external rotation.   ED course.  Vital stable. Labs revealed blood glucose of 294.  High sensitive troponin was 605.  CBC showed thrombocytopenia of 123 with otherwise unremarkable levels.  Blood group was A+ with NTE antibodies.  UA was positive for UTI. EKG as reviewed by me : Revealed normal sinus rhythm with a rate of 84 with Q waves septally.   Imaging: Portable chest ray showed no acute cardiopulmonary disease.  It did show aortic atherosclerosis.  Right hip x-ray showed comminuted intertrochanteric proximal right femoral fracture with the main distal fragment impacted and anteriorly displaced by about 300 of the shaft width.  It showed irregular sclerotic lesion in the proximal right femoral shaft probably a chondroid lesion or old bone infarct.  It also shows sclerotic metastasis were not strictly excluded.  Pelvic x-ray also showed left hip AVN and secondary DJD.   The patient was given fentanyl 50 mcg and Zofran 4 mg IV as well as 1 g of IV Rocephin and hydration with IV normal saline at 75 mill per hour. Dr. Harlow Mares from orthopedic surgery was consulted.  12/28: Blood pressure mildly elevated at 150/61.  Pending urine cultures and orthopedic consult. Going to OR later today.  Giving 1 dose of Ativan due to excessive anxiety.  12/29: Vital stable.  S/p ORIF yesterday.  Tolerated the procedure well  CBG elevated above 200, checking A1c and switching her to  SSI with meals, add 3 units with meal and Semglee 5 units twice daily.  Mild pseudo hyponatremia secondary to hyperglycemia.  Hemoglobin decreased to 11.2 after the surgery.  Starting her on iron supplement. She desaturated requiring up to 2 L of oxygen while working with PT, no baseline oxygen use. PT and OT are recommending SNF  12/30: Hemodynamically stable.  Patient again became hypoxic to high 80s with ambulation, mild need to go to SNF with some oxygen.  Urine cultures with E. coli-pending susceptibility.  CBG mildly elevated.  12/31: Hemodynamically stable.  CBG remained elevated, increasing Semglee to 8 units twice daily and mealtime coverage to 4 units.  Urine culture with E. coli with good sensitivity, only resistant to ampicillin, sulbactam and ciprofloxacin.  Switching antibiotics to Keflex to complete a 5-day course.  07/07/22: Remained stable.  Awaiting SNF placement.   Assessment and Plan: * Closed right hip fracture ([place]) Secondary to unwitnessed fall at her facility, s/p ORIF. -Continue with pain management -PT/OT are recommending SNF  Type 2 diabetes mellitus with hyperglycemia, without long-term current use of insulin (HCC) CBG elevated today, she was on metformin at home which is being currently held - A1c elevated at 8.8 -Increase Semglee to 8 -Increase mealtime coverage to 4 units -SSI  Acute lower UTI Urine cultures with E. coli-good sensitivity, only resistance to ampicillin, sulbactam and Cipro - Switch antibiotics to Keflex to complete a 5-day course.  Depression - We will continue Zoloft and trazodone.  Dyslipidemia -  Statin therapy will be resumed.  Protein-calorie malnutrition, severe Estimated body mass index is 20.74 kg/m as calculated from the following:   Height as of this encounter: '5\' 3"'$  (1.6 m).   Weight as of this encounter: 53.1 kg.   -Dietitian consult    Subjective: Patient was sitting in chair comfortably when seen today.  Stating  that she is getting tired and want to go back to bed.  Denies any pain.  Physical Exam: Vitals:   07/06/22 0909 07/06/22 1540 07/06/22 2346 07/07/22 0748  BP: 127/61 (!) 129/58 (!) 115/49 117/61  Pulse: 85 86 62 68  Resp: '17 17 20 17  '$ Temp: 99.3 F (37.4 C) 98.2 F (36.8 C) 98 F (36.7 C) 98.1 F (36.7 C)  TempSrc:    Oral  SpO2: 97% 93% 97% 95%  Weight:      Height:       General.  Frail and malnourished elderly lady, in no acute distress. Pulmonary.  Lungs clear bilaterally, normal respiratory effort. CV.  Regular rate and rhythm, no JVD, rub or murmur. Abdomen.  Soft, nontender, nondistended, BS positive. CNS.  Alert and oriented .  No focal neurologic deficit. Extremities.  No edema, no cyanosis, pulses intact and symmetrical. Psychiatry.  Judgment and insight appears impaired  Data Reviewed: Prior data reviewed  Family Communication:   Disposition: Status is: Inpatient Remains inpatient appropriate because: Severity of illness  Planned Discharge Destination: Skilled nursing facility  Time spent: 40 minutes  This record has been created using Systems analyst. Errors have been sought and corrected,but may not always be located. Such creation errors do not reflect on the standard of care.   Author: Lorella Nimrod, MD 07/07/2022 3:18 PM  For on call review [email or web address].

## 2022-07-08 DIAGNOSIS — W19XXXA Unspecified fall, initial encounter: Secondary | ICD-10-CM

## 2022-07-08 DIAGNOSIS — E1165 Type 2 diabetes mellitus with hyperglycemia: Secondary | ICD-10-CM | POA: Diagnosis not present

## 2022-07-08 DIAGNOSIS — S72001A Fracture of unspecified part of neck of right femur, initial encounter for closed fracture: Secondary | ICD-10-CM | POA: Diagnosis not present

## 2022-07-08 DIAGNOSIS — N390 Urinary tract infection, site not specified: Secondary | ICD-10-CM | POA: Diagnosis not present

## 2022-07-08 DIAGNOSIS — E43 Unspecified severe protein-calorie malnutrition: Secondary | ICD-10-CM

## 2022-07-08 LAB — GLUCOSE, CAPILLARY
Glucose-Capillary: 259 mg/dL — ABNORMAL HIGH (ref 70–99)
Glucose-Capillary: 409 mg/dL — ABNORMAL HIGH (ref 70–99)

## 2022-07-08 MED ORDER — FE FUM-VIT C-VIT B12-FA 460-60-0.01-1 MG PO CAPS: 1.0000 | ORAL_CAPSULE | Freq: Every day | ORAL | 0 refills | Status: AC

## 2022-07-08 MED ORDER — ENSURE ENLIVE PO LIQD: 237.0000 mL | Freq: Two times a day (BID) | ORAL | 12 refills | Status: AC

## 2022-07-08 MED ORDER — MAGNESIUM HYDROXIDE 400 MG/5ML PO SUSP: 30.0000 mL | Freq: Every day | ORAL | 0 refills | Status: AC | PRN

## 2022-07-08 MED ORDER — ACETAMINOPHEN 325 MG PO TABS: 650.0000 mg | ORAL_TABLET | Freq: Four times a day (QID) | ORAL | Status: AC | PRN

## 2022-07-08 MED ORDER — CEPHALEXIN 500 MG PO CAPS: 500.0000 mg | ORAL_CAPSULE | Freq: Three times a day (TID) | ORAL | 0 refills | Status: AC

## 2022-07-08 MED ORDER — INSULIN GLARGINE-YFGN 100 UNIT/ML ~~LOC~~ SOLN
10.0000 [IU] | Freq: Two times a day (BID) | SUBCUTANEOUS | Status: DC
  Filled 2022-07-08: qty 0.1

## 2022-07-08 MED ORDER — INSULIN GLARGINE-YFGN 100 UNIT/ML ~~LOC~~ SOLN: 10.0000 [IU] | Freq: Two times a day (BID) | SUBCUTANEOUS | 11 refills | Status: AC

## 2022-07-08 NOTE — TOC Progression Note (Addendum)
Transition of Care [facility]) - Progression Note    Patient Details  Name: Terry Watson MRN: [id number] Date of Birth: October 20, 1945  [facility]) CM/SW [place], RN Phone Number: 07/08/2022, 10:59 AM  Clinical Narrative:    The patient is to go to [facility] today going to room [place] to notify, Unable to reach, no VM to leave a message, called [facility] and let them know that she will dc to [facility] to transport  EMS called and arranged for transport     Barriers to Discharge: Continued Medical Work up  [employer] and [place] arrangements for the past 2 months: [place] Expected Discharge Date: 07/08/22                                     Social Determinants of Health (SDOH) Interventions SDOH Screenings   Food Insecurity: No Food Insecurity (07/03/2022)  Housing: Low Risk  (07/03/2022)  Transportation Needs: No Transportation Needs (01/21/2022)  Utilities: Not At Risk (07/03/2022)  Alcohol Screen: Low Risk  (01/21/2022)  Depression (PHQ2-9): Medium Risk (02/05/2022)  Financial Resource Strain: Low Risk  (01/21/2022)  Physical Activity: Insufficiently Active (01/21/2022)  Social Connections: Moderately Isolated (01/21/2022)  Stress: No Stress Concern Present (01/21/2022)  Tobacco Use: Low Risk  (07/04/2022)    Readmission Risk Interventions     No data to display

## 2022-07-08 NOTE — Discharge Summary (Signed)
Physician Discharge Summary   Patient: Terry Watson MRN: [id number] DOB: 17-Oct-1945  Admit date:     07/03/2022  Discharge date: 07/08/22  Discharge Physician: Lorella Nimrod   PCP: Virginia Crews, MD   Recommendations at discharge:  Please obtain CBC and BMP in 1 week Monitor blood glucose level regularly, she might need some more added insulin. Ensure completion of antibiotics, she only has 1 more day left. Follow-up with orthopedic surgery Follow-up with primary care provider  Discharge Diagnoses: Principal Problem:   Closed right hip fracture ([place]) Active Problems:   Type 2 diabetes mellitus with hyperglycemia, without long-term current use of insulin (HCC)   Acute lower UTI   Depression   Dyslipidemia   Protein-calorie malnutrition, severe   Hospital Course: Taken from H&P.  Terry Watson is a 77 y.o. Caucasian female with medical history significant for type 2 diabetes mellitus, dementia, dyslipidemia and osteoporosis, who presented to the ER with acute onset of unwitnessed fall at her SNF with subsequent right hip pain when she was given an attempt to stand by the staff.  She was noted to have shortened right leg with external rotation.   ED course.  Vital stable. Labs revealed blood glucose of 294.  High sensitive troponin was 605.  CBC showed thrombocytopenia of 123 with otherwise unremarkable levels.  Blood group was A+ with NTE antibodies.  UA was positive for UTI. EKG as reviewed by me : Revealed normal sinus rhythm with a rate of 84 with Q waves septally.   Imaging: Portable chest ray showed no acute cardiopulmonary disease.  It did show aortic atherosclerosis.  Right hip x-ray showed comminuted intertrochanteric proximal right femoral fracture with the main distal fragment impacted and anteriorly displaced by about 300 of the shaft width.  It showed irregular sclerotic lesion in the proximal right femoral shaft probably a chondroid lesion or old bone infarct.   It also shows sclerotic metastasis were not strictly excluded.  Pelvic x-ray also showed left hip AVN and secondary DJD.   The patient was given fentanyl 50 mcg and Zofran 4 mg IV as well as 1 g of IV Rocephin and hydration with IV normal saline at 75 mill per hour. Dr. Harlow Mares from orthopedic surgery was consulted.  12/28: Blood pressure mildly elevated at 150/61.  Pending urine cultures and orthopedic consult. Going to OR later today.  Giving 1 dose of Ativan due to excessive anxiety.  12/29: Vital stable.  S/p ORIF yesterday.  Tolerated the procedure well  CBG elevated above 200, checking A1c and switching her to SSI with meals, add 3 units with meal and Semglee 5 units twice daily.  Mild pseudo hyponatremia secondary to hyperglycemia.  Hemoglobin decreased to 11.2 after the surgery.  Starting her on iron supplement. She desaturated requiring up to 2 L of oxygen while working with PT, no baseline oxygen use. PT and OT are recommending SNF  12/30: Hemodynamically stable.  Patient again became hypoxic to high 80s with ambulation, mild need to go to SNF with some oxygen.  Urine cultures with E. coli-pending susceptibility.  CBG mildly elevated.  12/31: Hemodynamically stable.  CBG remained elevated, increasing Semglee to 8 units twice daily and mealtime coverage to 4 units.  Urine culture with E. coli with good sensitivity, only resistant to ampicillin, sulbactam and ciprofloxacin.  Switching antibiotics to Keflex to complete a 5-day course.  07/07/22: Remained stable.  Awaiting SNF placement.  1/2: Hemodynamically stable.  CBG remained little elevated, increasing the  dose of Semglee to 10 units twice daily.  We obtained insurance authorization for rehab. Patient is making improvement while working with PT as inpatient.  She is being discharged on current medications.  We added Semglee 10 units twice daily along with her home metoprolol.  Skilled nursing facility should be able to monitor her  blood glucose level and make changes accordingly.  Patient will continue on current medications and need to have a close follow-up with her providers for further recommendations.  Assessment and Plan: * Closed right hip fracture ([place]) Secondary to unwitnessed fall at her facility, s/p ORIF. -Continue with pain management -PT/OT are recommending SNF  Type 2 diabetes mellitus with hyperglycemia, without long-term current use of insulin (HCC) CBG elevated today, she was on metformin at home which is being currently held - A1c elevated at 8.8 -Increase Semglee to 8 -Increase mealtime coverage to 4 units -SSI  Acute lower UTI Urine cultures with E. coli-good sensitivity, only resistance to ampicillin, sulbactam and Cipro - Switch antibiotics to Keflex to complete a 5-day course.  Depression - We will continue Zoloft and trazodone.  Dyslipidemia - Statin therapy will be resumed.  Protein-calorie malnutrition, severe Estimated body mass index is 20.74 kg/m as calculated from the following:   Height as of this encounter: '5\' 3"'$  (1.6 m).   Weight as of this encounter: 53.1 kg.   -Dietitian consult        Pain control - [place] Controlled Substance Reporting System database was reviewed. and patient was instructed, not to drive, operate heavy machinery, perform activities at heights, swimming or participation in water activities or provide baby-sitting services while on Pain, Sleep and Anxiety Medications; until their outpatient Physician has advised to do so again. Also recommended to not to take more than prescribed Pain, Sleep and Anxiety Medications.  Consultants: Orthopedic surgery Procedures performed: ORIF Disposition: Skilled nursing facility Diet recommendation:  Discharge Diet Orders (From admission, onward)     Start     Ordered   07/08/22 0000  Diet - low sodium heart healthy        07/08/22 1051           Cardiac and Carb modified diet DISCHARGE  MEDICATION: Allergies as of 07/08/2022   No Known Allergies      Medication List     TAKE these medications    Accu-Chek Guide test strip Generic drug: glucose blood CHECK GLUCOSE ONCE DAILY .   acetaminophen 325 MG tablet Commonly known as: TYLENOL Take 2 tablets (650 mg total) by mouth every 6 (six) hours as needed for mild pain, moderate pain, fever or headache.   aspirin 81 MG chewable tablet Chew 1 tablet (81 mg total) by mouth daily.   atorvastatin 40 MG tablet Commonly known as: LIPITOR Take 1 tablet (40 mg total) by mouth daily.   Calcium 600+D 600-5 MG-MCG Tabs Generic drug: Calcium Carbonate-Vitamin D Take 1 tablet by mouth daily.   cephALEXin 500 MG capsule Commonly known as: KEFLEX Take 1 capsule (500 mg total) by mouth every 8 (eight) hours for 3 doses. What changed: when to take this   Fe Fum-Vit C-Vit B12-FA Caps capsule Commonly known as: TRIGELS-F FORTE Take 1 capsule by mouth daily after breakfast. Start taking on: July 09, 2022   feeding supplement Liqd Take 237 mLs by mouth 2 (two) times daily between meals.   HYDROcodone-acetaminophen 5-325 MG tablet Commonly known as: NORCO/VICODIN Take 1-2 tablets by mouth every 6 (six) hours as needed  for moderate pain.   insulin glargine-yfgn 100 UNIT/ML injection Commonly known as: SEMGLEE Inject 0.1 mLs (10 Units total) into the skin 2 (two) times daily.   latanoprost 0.005 % ophthalmic solution Commonly known as: XALATAN Place 1 drop into both eyes at bedtime.   magnesium hydroxide 400 MG/5ML suspension Commonly known as: MILK OF MAGNESIA Take 30 mLs by mouth daily as needed for mild constipation.   metFORMIN 500 MG tablet Commonly known as: GLUCOPHAGE Take 1 tablet (500 mg total) by mouth 2 (two) times daily with a meal.   multivitamin tablet Take 1 tablet by mouth daily.   niacin 500 MG CR capsule Take 1 capsule (500 mg total) by mouth at bedtime.   sertraline 50 MG tablet Commonly  known as: ZOLOFT Take 1 tablet (50 mg total) by mouth daily.   traZODone 50 MG tablet Commonly known as: DESYREL Take 50 mg by mouth at bedtime as needed for sleep.        Contact information for follow-up providers     Virginia Crews, MD. Schedule an appointment as soon as possible for a visit in 1 week(s).   Specialty: Family Medicine Contact information: [address] [place] [address] [phone]         Earnestine Leys, MD. Schedule an appointment as soon as possible for a visit in 1 week(s).   Specialty: Orthopedic Surgery Contact information: [place] [address] [phone]              Contact information for after-discharge care     [facility] .   Service: Skilled Nursing Contact information: [place] [phone]                    Discharge Exam: Danley Danker Weights   07/03/22 0836  Weight: 53.1 kg   General.  Frail and malnourished elderly lady, in no acute distress. Pulmonary.  Lungs clear bilaterally, normal respiratory effort. CV.  Regular rate and rhythm, no JVD, rub or murmur. Abdomen.  Soft, nontender, nondistended, BS positive. CNS.  Alert and oriented .  No focal neurologic deficit. Extremities.  No edema, no cyanosis, pulses intact and symmetrical. Psychiatry.  Appears to have some cognitive impairment  Condition at discharge: stable  The results of significant diagnostics from this hospitalization (including imaging, microbiology, ancillary and laboratory) are listed below for reference.   Imaging Studies: DG HIP UNILAT WITH PELVIS 2-3 VIEWS RIGHT  Result Date: 07/03/2022 CLINICAL DATA:  Intramedullary nail. EXAM: DG HIP (WITH OR WITHOUT PELVIS) 2-3V RIGHT COMPARISON:  07/02/2022 FINDINGS: Three images are submitted, showing  intramedullary nail and lag screw transfixing intertrochanteric fracture of the RIGHT hip. IMPRESSION: Intramedullary nail and lag screw transfixing intertrochanteric fracture of the RIGHT hip. ORIF of the RIGHT hip. Electronically Signed   By: Nolon Nations M.D.   On: 07/03/2022 16:27   DG C-Arm 1-60 Min-No Report  Result Date: 07/03/2022 Fluoroscopy was utilized by the requesting physician.  No radiographic interpretation.   DG HIP UNILAT WITH PELVIS 2-3 VIEWS RIGHT  Result Date: 07/02/2022 CLINICAL DATA:  10176. Fall injury. Suspected right hip fracture. History of left hip surgery. EXAM: DG HIP (WITH OR WITHOUT PELVIS) 2-3V RIGHT; PORTABLE CHEST - 1 VIEW COMPARISON:  None Available. FINDINGS: Chest AP portable: The cardiomediastinal silhouette and vasculature are normal apart from calcifications in  the aortic arch. The lungs are clear. The sulci are sharp. There is osteopenia and slight thoracic dextroscoliosis. There is artifact from overlying clothing. The thoracic cage grossly intact. AP pelvis and AP and cross-table lateral right hip views: Generalized osteopenia. Comminuted intertrochanteric proximal right femoral fracture is noted, with the lesser trochanter separately fractured and medially displaced and with the main distal fragment impacted and mildly cephalad displaced, also anteriorly displaced by about 1/3 of a shaft width. There is an irregular sclerotic lesion in the proximal right femoral shaft just below the level of the fracture, which could be a chondroid lesion or old bone infarct. Sclerotic metastasis not strictly excluded. There is normal spacing at the right hip, SI joints, symphysis pubis. There is moderate degenerative arthrosis at the left hip with mild femoral head flattening consistent with AVN. A left femoral intramedullary rod is partially visible. The visualized portion without evidence of loosening. There are pelvic phleboliths. No other significant soft tissue  findings. Mild-to-moderate fecal stasis. IMPRESSION: 1. No evidence of acute chest disease.  Aortic atherosclerosis. 2. Comminuted intertrochanteric proximal right femoral fracture, with the main distal fragment impacted and anteriorly displaced by about 1/3 of a shaft width. 3. Irregular sclerotic lesion in the proximal right femoral shaft, probably either a chondroid lesion or old bone infarct. Sclerotic metastasis not strictly excluded. 4. Left hip AVN and secondary DJD. Electronically Signed   By: Telford Nab M.D.   On: 07/02/2022 23:33   DG Chest Port 1 View  Result Date: 07/02/2022 CLINICAL DATA:  10176. Fall injury. Suspected right hip fracture. History of left hip surgery. EXAM: DG HIP (WITH OR WITHOUT PELVIS) 2-3V RIGHT; PORTABLE CHEST - 1 VIEW COMPARISON:  None Available. FINDINGS: Chest AP portable: The cardiomediastinal silhouette and vasculature are normal apart from calcifications in the aortic arch. The lungs are clear. The sulci are sharp. There is osteopenia and slight thoracic dextroscoliosis. There is artifact from overlying clothing. The thoracic cage grossly intact. AP pelvis and AP and cross-table lateral right hip views: Generalized osteopenia. Comminuted intertrochanteric proximal right femoral fracture is noted, with the lesser trochanter separately fractured and medially displaced and with the main distal fragment impacted and mildly cephalad displaced, also anteriorly displaced by about 1/3 of a shaft width. There is an irregular sclerotic lesion in the proximal right femoral shaft just below the level of the fracture, which could be a chondroid lesion or old bone infarct. Sclerotic metastasis not strictly excluded. There is normal spacing at the right hip, SI joints, symphysis pubis. There is moderate degenerative arthrosis at the left hip with mild femoral head flattening consistent with AVN. A left femoral intramedullary rod is partially visible. The visualized portion without  evidence of loosening. There are pelvic phleboliths. No other significant soft tissue findings. Mild-to-moderate fecal stasis. IMPRESSION: 1. No evidence of acute chest disease.  Aortic atherosclerosis. 2. Comminuted intertrochanteric proximal right femoral fracture, with the main distal fragment impacted and anteriorly displaced by about 1/3 of a shaft width. 3. Irregular sclerotic lesion in the proximal right femoral shaft, probably either a chondroid lesion or old bone infarct. Sclerotic metastasis not strictly excluded. 4. Left hip AVN and secondary DJD. Electronically Signed   By: Telford Nab M.D.   On: 07/02/2022 23:33   CT Head Wo Contrast  Result Date: 07/02/2022 CLINICAL DATA:  Head and neck trauma. Patient arrives from assisted living facility post unwitnessed fall. History of dementia. EXAM: CT HEAD WITHOUT CONTRAST CT CERVICAL SPINE WITHOUT CONTRAST TECHNIQUE: Multidetector  CT imaging of the head and cervical spine was performed following the standard protocol without intravenous contrast. Multiplanar CT image reconstructions of the cervical spine were also generated. RADIATION DOSE REDUCTION: This exam was performed according to the departmental dose-optimization program which includes automated exposure control, adjustment of the mA and/or kV according to patient size and/or use of iterative reconstruction technique. COMPARISON:  MR head dated September 23, 2019 FINDINGS: CT HEAD FINDINGS Brain: No evidence of acute infarction, hemorrhage, hydrocephalus, extra-axial collection or mass lesion/mass effect. Generalized cerebral atrophy. Patchy area of low-attenuation of the periventricular white matter presumed advanced chronic microvascular ischemic changes. 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 Vascular: No hyperdense vessel or unexpected calcification. Skull: Normal. Negative for fracture or focal lesion. Sinuses/Orbits: No acute finding.  Right scleral band. Other:  None. CT CERVICAL SPINE FINDINGS Alignment: Normal. Skull base and vertebrae: No acute fracture. No primary bone lesion or focal pathologic process. Soft tissues and spinal canal: No prevertebral fluid or swelling. No visible canal hematoma. Disc levels: C2-C3:  Mild bilateral facet joint arthropathy. C3-C4: Disc height loss and uncovertebral joint arthropathy. Moderate bilateral facet joint arthropathy. No significant neural foraminal stenosis. C4-C5: Moderate bilateral facet joint arthropathy. No significant spinal canal or neural foraminal stenosis. C5-C6: Disc height loss and uncovertebral joint arthropathy with moderate left neural foraminal stenosis. Moderate bilateral facet joint arthropathy. C6-C7: Disc height loss and uncovertebral joint arthropathy. Mild bilateral neural foraminal stenosis. C7-T1:  No significant finding.  Mild right facet joint arthropathy. Upper chest: Negative. Other: None IMPRESSION: CT HEAD: 1. No acute intracranial abnormality. 2. Generalized cerebral atrophy and advanced chronic microvascular ischemic changes of the white matter. 3. 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 CT CERVICAL SPINE: 1. No acute fracture or subluxation. 2. Moderate multilevel degenerative disc and facet joint disease. Electronically Signed   By: Keane Police D.O.   On: 07/02/2022 23:09   CT Cervical Spine Wo Contrast  Result Date: 07/02/2022 CLINICAL DATA:  Head and neck trauma. Patient arrives from assisted living facility post unwitnessed fall. History of dementia. EXAM: CT HEAD WITHOUT CONTRAST CT CERVICAL SPINE WITHOUT CONTRAST TECHNIQUE: Multidetector CT imaging of the head and cervical spine was performed following the standard protocol without intravenous contrast. Multiplanar CT image reconstructions of the cervical spine were also generated. RADIATION DOSE REDUCTION: This exam was performed according to the departmental dose-optimization program which includes  automated exposure control, adjustment of the mA and/or kV according to patient size and/or use of iterative reconstruction technique. COMPARISON:  MR head dated September 23, 2019 FINDINGS: CT HEAD FINDINGS Brain: No evidence of acute infarction, hemorrhage, hydrocephalus, extra-axial collection or mass lesion/mass effect. Generalized cerebral atrophy. Patchy area of low-attenuation of the periventricular white matter presumed advanced chronic microvascular ischemic changes. 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 Vascular: No hyperdense vessel or unexpected calcification. Skull: Normal. Negative for fracture or focal lesion. Sinuses/Orbits: No acute finding.  Right scleral band. Other: None. CT CERVICAL SPINE FINDINGS Alignment: Normal. Skull base and vertebrae: No acute fracture. No primary bone lesion or focal pathologic process. Soft tissues and spinal canal: No prevertebral fluid or swelling. No visible canal hematoma. Disc levels: C2-C3:  Mild bilateral facet joint arthropathy. C3-C4: Disc height loss and uncovertebral joint arthropathy. Moderate bilateral facet joint arthropathy. No significant neural foraminal stenosis. C4-C5: Moderate bilateral facet joint arthropathy. No significant spinal canal or neural foraminal stenosis. C5-C6: Disc height loss and uncovertebral joint arthropathy with moderate left neural foraminal stenosis. Moderate  bilateral facet joint arthropathy. C6-C7: Disc height loss and uncovertebral joint arthropathy. Mild bilateral neural foraminal stenosis. C7-T1:  No significant finding.  Mild right facet joint arthropathy. Upper chest: Negative. Other: None IMPRESSION: CT HEAD: 1. No acute intracranial abnormality. 2. Generalized cerebral atrophy and advanced chronic microvascular ischemic changes of the white matter. 3. 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 CT CERVICAL SPINE: 1. No acute fracture or subluxation. 2. Moderate  multilevel degenerative disc and facet joint disease. Electronically Signed   By: Keane Police D.O.   On: 07/02/2022 23:09    Microbiology: Results for orders placed or performed during the hospital encounter of 07/03/22  Urine Culture     Status: Abnormal   Collection Time: 07/03/22 12:57 AM   Specimen: Urine, Clean Catch  Result Value Ref Range Status   Specimen Description   Final    URINE, CLEAN CATCH Performed at [facility], [address] [place], [place] [address]    Special Requests   Final    NONE Performed at [facility], [address] [place], [place] [address]    Culture >=100,000 COLONIES/mL ESCHERICHIA COLI (A)  Final   Report Status 07/06/2022 FINAL  Final   Organism ID, Bacteria ESCHERICHIA COLI (A)  Final      Susceptibility   Escherichia coli - MIC*    AMPICILLIN >=32 RESISTANT Resistant     CEFAZOLIN <=4 SENSITIVE Sensitive     CEFEPIME <=0.12 SENSITIVE Sensitive     CEFTRIAXONE <=0.25 SENSITIVE Sensitive     CIPROFLOXACIN >=4 RESISTANT Resistant     GENTAMICIN <=1 SENSITIVE Sensitive     IMIPENEM <=0.25 SENSITIVE Sensitive     NITROFURANTOIN <=16 SENSITIVE Sensitive     TRIMETH/SULFA <=20 SENSITIVE Sensitive     AMPICILLIN/SULBACTAM 16 INTERMEDIATE Intermediate     PIP/TAZO <=4 SENSITIVE Sensitive     * >=100,000 COLONIES/mL ESCHERICHIA COLI    Labs: CBC: Recent Labs  Lab 07/02/22 2328 07/04/22 0747  WBC 8.9 8.2  HGB 12.1 11.2*  HCT 37.3 34.3*  MCV 94.2 92.5  PLT 123* 712*   Basic Metabolic Panel: Recent Labs  Lab 07/02/22 2328 07/04/22 0747  NA 135 134*  K 4.1 4.1  CL 101 103  CO2 26 23  GLUCOSE 294* 239*  BUN 13 9  CREATININE 0.64 0.50  CALCIUM 8.5* 8.1*   Liver Function Tests: No results for input(s): "AST", "ALT", "ALKPHOS", "BILITOT", "PROT", "ALBUMIN" in the last 168 hours. CBG: Recent Labs  Lab 07/07/22 1022 07/07/22 1202 07/07/22 1648 07/07/22 2111 07/08/22 0802  GLUCAP 361* 292* 303* 278*  259*    Discharge time spent: greater than 30 minutes.  This record has been created using Systems analyst. Errors have been sought and corrected,but may not always be located. Such creation errors do not reflect on the standard of care.   Signed: Lorella Nimrod, MD [facility] 07/08/2022

## 2022-07-08 NOTE — TOC Progression Note (Signed)
Transition of Care [facility]) - Progression Note    Patient Details  Name: Terry Watson MRN: [id number] Date of Birth: 07-01-1946  [facility] of [facility]) CM/SW [place], RN Phone Number: 07/08/2022, 8:48 AM  Clinical Narrative:    Reached out to check with [facility] if the patient can go today, waiting on a response     Barriers to Discharge: Continued Medical Work up  Expected Discharge Plan and Services       Living arrangements for the past 2 months: [place]                                       Social Determinants of Health (SDOH) Interventions SDOH Screenings   Food Insecurity: No Food Insecurity (07/03/2022)  Housing: Low Risk  (07/03/2022)  Transportation Needs: No Transportation Needs (01/21/2022)  Utilities: Not At Risk (07/03/2022)  Alcohol Screen: Low Risk  (01/21/2022)  Depression (PHQ2-9): Medium Risk (02/05/2022)  Financial Resource Strain: Low Risk  (01/21/2022)  Physical Activity: Insufficiently Active (01/21/2022)  Social Connections: Moderately Isolated (01/21/2022)  Stress: No Stress Concern Present (01/21/2022)  Tobacco Use: Low Risk  (07/04/2022)    Readmission Risk Interventions     No data to display

## 2022-07-08 NOTE — Progress Notes (Signed)
Physical Therapy Treatment Patient Details Name: Terry Watson MRN: [id number] DOB: [birth year] Today's Date: 07/08/2022   History of Present Illness Terry Watson is a 77 y.o. Caucasian female with medical history significant for type 2 diabetes mellitus, dementia, dyslipidemia and osteoporosis, who presented to the ER with acute onset of unwitnessed fall at her SNF with subsequent right hip pain when she was given an attempt to stand by the staff. Pt s/p R hip IM nailing 07/03/22.    PT Comments    Pt continues to be pleasantly confused, but willing to participate with consistent cuing and encouragement.  She was able to ambulate ~15 ft and do some AROM exercises but did indicate more pain today than yesterday.    Recommendations for follow up therapy are one component of a multi-disciplinary discharge planning process, led by the attending physician.  Recommendations may be updated based on patient status, additional functional criteria and insurance authorization.  Follow Up Recommendations  Skilled nursing-short term rehab (<3 hours/day) Can patient physically be transported by private vehicle: No   Assistance Recommended at Discharge Frequent or constant Supervision/Assistance  Patient can return home with the following A lot of help with bathing/dressing/bathroom;Two people to help with walking and/or transfers;Assist for transportation;Help with stairs or ramp for entrance;A lot of help with walking and/or transfers   Equipment Recommendations   (TBD at next venue of care)    Recommendations for Other Services       Precautions / Restrictions Precautions Precautions: Fall Restrictions RLE Weight Bearing: Weight bearing as tolerated     Mobility  Bed Mobility               General bed mobility comments: in recliner pre/post session    Transfers Overall transfer level: Needs assistance Equipment used: Rolling walker (2 wheels) Transfers: Sit to/from Stand Sit  to Stand: Min assist           General transfer comment: Pt with more hesitancy today with attempting to shift weight forward to stand - continues to need a lot of cuing for hand placement and to initiate rising (and same with back to sitting)    Ambulation/Gait Ambulation/Gait assistance: Min assist Gait Distance (Feet): 15 Feet Assistive device: Rolling walker (2 wheels)         General Gait Details: Pt a little more pain hesitant today with increased UE support, decreased speed and mild limp on the R - though no LOBs and assist for walker management and mostly for encouragement for continued effort.   Stairs             Wheelchair Mobility    Modified Rankin (Stroke Patients Only)       Balance Overall balance assessment: Needs assistance Sitting-balance support: Bilateral upper extremity supported, Feet supported Sitting balance-Leahy Scale: Fair     Standing balance support: Bilateral upper extremity supported, Reliant on assistive device for balance Standing balance-Leahy Scale: Fair                              Cognition Arousal/Alertness: Awake/alert Behavior During Therapy: Restless Overall Cognitive Status: History of cognitive impairments - at baseline                                 General Comments: Pt pleasantly confused, more able to interact today        Exercises  General Exercises - Lower Extremity Ankle Circles/Pumps: AROM, 10 reps Quad Sets: Strengthening, 10 reps Long Arc Quad: AROM, 10 reps Hip ABduction/ADduction: Strengthening, AROM, 10 reps    General Comments        Pertinent Vitals/Pain Pain Assessment Faces Pain Scale: Hurts even more Pain Location: pain indicates more pain today than she has for the last few days - repeated reminders that she broke her hip    Home Living                          Prior Function            PT Goals (current goals can now be found in the care  plan section) Progress towards PT goals: Progressing toward goals    Frequency    BID      PT Plan Frequency needs to be updated    Co-evaluation              AM-PAC PT "6 Clicks" Mobility   Outcome Measure  Help needed turning from your back to your side while in a flat bed without using bedrails?: A Little Help needed moving from lying on your back to sitting on the side of a flat bed without using bedrails?: A Little Help needed moving to and from a bed to a chair (including a wheelchair)?: A Lot Help needed standing up from a chair using your arms (e.g., wheelchair or bedside chair)?: A Lot Help needed to walk in hospital room?: A Lot Help needed climbing 3-5 steps with a railing? : A Lot 6 Click Score: 14    End of Session Equipment Utilized During Treatment: Gait belt Activity Tolerance: Patient tolerated treatment well Patient left: with family/visitor present;with chair alarm set;with call bell/phone within reach Nurse Communication: Mobility status PT Visit Diagnosis: Unsteadiness on feet (R26.81);Other abnormalities of gait and mobility (R26.89);Difficulty in walking, not elsewhere classified (R26.2);Muscle weakness (generalized) (M62.81)     Time: 1025-1040 PT Time Calculation (min) (ACUTE ONLY): 15 min  Charges:  $Therapeutic Exercise: 8-22 mins                     Kreg Shropshire, DPT 07/08/2022, 12:01 PM

## 2022-07-08 NOTE — Inpatient Diabetes Management (Signed)
Inpatient Diabetes Program Recommendations  AACE/ADA: New Consensus Statement on Inpatient Glycemic Control   Target Ranges:  Prepandial:   less than 140 mg/dL      Peak postprandial:   less than 180 mg/dL (1-2 hours)      Critically ill patients:  140 - 180 mg/dL    Latest Reference Range & Units 07/07/22 07:51 07/07/22 10:22 07/07/22 12:02 07/07/22 16:48 07/07/22 21:11 07/08/22 08:02  Glucose-Capillary 70 - 99 mg/dL 181 (H) 361 (H) 292 (H) 303 (H) 278 (H) 259 (H)   Review of Glycemic Control  Diabetes history: DM2 Outpatient Diabetes medications: Metformin 500 mg BID Current orders for Inpatient glycemic control: Semglee 10 units BID, Novolog 4 units TID with meals, Novolog 0-9 units TID with meals  Inpatient Diabetes Program Recommendations:    Insulin: Noted Semglee increased from 8 to 10 units BID today. Please consider increasing meal coverage to Novolog 6 units TID with meals.  Thanks, Barnie Alderman, RN, MSN, [place] Diabetes Coordinator Inpatient Diabetes Program [phone] (Team Pager from 8am to [place])

## 2022-07-08 NOTE — Care Management Important Message (Signed)
Important Message  Patient Details  Name: Terry Watson MRN: [id number] Date of Birth: Nov 23, 1945   Medicare Important Message Given:  Yes     [name] 07/08/2022, 11:25 AM

## 2022-07-08 NOTE — Progress Notes (Signed)
Pt has DC order, report called in and given to [facility]. All belongings were packed. Awaiting for EMS transport.

## 2022-07-09 ENCOUNTER — Emergency Department: Payer: Medicare Other

## 2022-07-09 ENCOUNTER — Other Ambulatory Visit: Payer: Self-pay

## 2022-07-09 ENCOUNTER — Inpatient Hospital Stay
Admission: EM | Admit: 2022-07-09 | Discharge: 2022-08-07 | DRG: 951 | Disposition: E | Payer: Medicare Other | Source: Skilled Nursing Facility | Attending: Hospitalist | Admitting: Hospitalist

## 2022-07-09 DIAGNOSIS — R29810 Facial weakness: Secondary | ICD-10-CM | POA: Diagnosis present

## 2022-07-09 DIAGNOSIS — I616 Nontraumatic intracerebral hemorrhage, multiple localized: Secondary | ICD-10-CM | POA: Diagnosis not present

## 2022-07-09 DIAGNOSIS — M810 Age-related osteoporosis without current pathological fracture: Secondary | ICD-10-CM | POA: Diagnosis present

## 2022-07-09 DIAGNOSIS — Z515 Encounter for palliative care: Secondary | ICD-10-CM | POA: Diagnosis not present

## 2022-07-09 DIAGNOSIS — Z6820 Body mass index (BMI) 20.0-20.9, adult: Secondary | ICD-10-CM

## 2022-07-09 DIAGNOSIS — R4182 Altered mental status, unspecified: Secondary | ICD-10-CM

## 2022-07-09 DIAGNOSIS — R64 Cachexia: Secondary | ICD-10-CM | POA: Diagnosis present

## 2022-07-09 DIAGNOSIS — F32A Depression, unspecified: Secondary | ICD-10-CM | POA: Diagnosis present

## 2022-07-09 DIAGNOSIS — Z961 Presence of intraocular lens: Secondary | ICD-10-CM | POA: Diagnosis present

## 2022-07-09 DIAGNOSIS — I10 Essential (primary) hypertension: Secondary | ICD-10-CM | POA: Diagnosis present

## 2022-07-09 DIAGNOSIS — G8104 Flaccid hemiplegia affecting left nondominant side: Secondary | ICD-10-CM | POA: Diagnosis present

## 2022-07-09 DIAGNOSIS — Z8781 Personal history of (healed) traumatic fracture: Secondary | ICD-10-CM

## 2022-07-09 DIAGNOSIS — R413 Other amnesia: Secondary | ICD-10-CM | POA: Diagnosis present

## 2022-07-09 DIAGNOSIS — Z79891 Long term (current) use of opiate analgesic: Secondary | ICD-10-CM

## 2022-07-09 DIAGNOSIS — S72001A Fracture of unspecified part of neck of right femur, initial encounter for closed fracture: Secondary | ICD-10-CM | POA: Diagnosis present

## 2022-07-09 DIAGNOSIS — R5383 Other fatigue: Secondary | ICD-10-CM | POA: Diagnosis present

## 2022-07-09 DIAGNOSIS — R29722 NIHSS score 22: Secondary | ICD-10-CM | POA: Diagnosis present

## 2022-07-09 DIAGNOSIS — I619 Nontraumatic intracerebral hemorrhage, unspecified: Secondary | ICD-10-CM

## 2022-07-09 DIAGNOSIS — R471 Dysarthria and anarthria: Secondary | ICD-10-CM | POA: Diagnosis present

## 2022-07-09 DIAGNOSIS — S40012A Contusion of left shoulder, initial encounter: Secondary | ICD-10-CM | POA: Diagnosis present

## 2022-07-09 DIAGNOSIS — W06XXXA Fall from bed, initial encounter: Secondary | ICD-10-CM | POA: Diagnosis present

## 2022-07-09 DIAGNOSIS — R296 Repeated falls: Secondary | ICD-10-CM | POA: Diagnosis present

## 2022-07-09 DIAGNOSIS — Z7984 Long term (current) use of oral hypoglycemic drugs: Secondary | ICD-10-CM

## 2022-07-09 DIAGNOSIS — Z8673 Personal history of transient ischemic attack (TIA), and cerebral infarction without residual deficits: Secondary | ICD-10-CM

## 2022-07-09 DIAGNOSIS — E785 Hyperlipidemia, unspecified: Secondary | ICD-10-CM | POA: Diagnosis present

## 2022-07-09 DIAGNOSIS — R9089 Other abnormal findings on diagnostic imaging of central nervous system: Secondary | ICD-10-CM | POA: Insufficient documentation

## 2022-07-09 DIAGNOSIS — M720 Palmar fascial fibromatosis [Dupuytren]: Secondary | ICD-10-CM | POA: Diagnosis present

## 2022-07-09 DIAGNOSIS — R4701 Aphasia: Secondary | ICD-10-CM | POA: Diagnosis present

## 2022-07-09 DIAGNOSIS — I611 Nontraumatic intracerebral hemorrhage in hemisphere, cortical: Secondary | ICD-10-CM | POA: Diagnosis present

## 2022-07-09 DIAGNOSIS — Z66 Do not resuscitate: Secondary | ICD-10-CM | POA: Diagnosis present

## 2022-07-09 DIAGNOSIS — R414 Neurologic neglect syndrome: Secondary | ICD-10-CM | POA: Diagnosis present

## 2022-07-09 DIAGNOSIS — E119 Type 2 diabetes mellitus without complications: Secondary | ICD-10-CM | POA: Diagnosis present

## 2022-07-09 DIAGNOSIS — Z7982 Long term (current) use of aspirin: Secondary | ICD-10-CM

## 2022-07-09 DIAGNOSIS — Z90710 Acquired absence of both cervix and uterus: Secondary | ICD-10-CM

## 2022-07-09 DIAGNOSIS — F0390 Unspecified dementia without behavioral disturbance: Secondary | ICD-10-CM | POA: Insufficient documentation

## 2022-07-09 DIAGNOSIS — Z79899 Other long term (current) drug therapy: Secondary | ICD-10-CM

## 2022-07-09 DIAGNOSIS — G936 Cerebral edema: Secondary | ICD-10-CM | POA: Diagnosis present

## 2022-07-09 DIAGNOSIS — Z794 Long term (current) use of insulin: Secondary | ICD-10-CM

## 2022-07-09 LAB — COMPREHENSIVE METABOLIC PANEL
ALT: 34 U/L (ref 0–44)
AST: 35 U/L (ref 15–41)
Albumin: 2.4 g/dL — ABNORMAL LOW (ref 3.5–5.0)
Alkaline Phosphatase: 55 U/L (ref 38–126)
Anion gap: 8 (ref 5–15)
BUN: 16 mg/dL (ref 8–23)
CO2: 25 mmol/L (ref 22–32)
Calcium: 8.5 mg/dL — ABNORMAL LOW (ref 8.9–10.3)
Chloride: 102 mmol/L (ref 98–111)
Creatinine, Ser: 0.59 mg/dL (ref 0.44–1.00)
GFR, Estimated: >60 mL/min (ref >60)
Glucose, Bld: 317 mg/dL — ABNORMAL HIGH (ref 70–99)
Potassium: 4.4 mmol/L (ref 3.5–5.1)
Sodium: 135 mmol/L (ref 135–145)
Total Bilirubin: 1.2 mg/dL (ref 0.3–1.2)
Total Protein: 5.9 g/dL — ABNORMAL LOW (ref 6.5–8.1)

## 2022-07-09 LAB — DIFFERENTIAL
Abs Immature Granulocytes: 0.49 10*3/uL — ABNORMAL HIGH (ref 0.00–0.07)
Basophils Absolute: 0 10*3/uL (ref 0.0–0.1)
Basophils Relative: 0 %
Eosinophils Absolute: 0 10*3/uL (ref 0.0–0.5)
Eosinophils Relative: 0 %
Immature Granulocytes: 4 %
Lymphocytes Relative: 8 %
Lymphs Abs: 1 10*3/uL (ref 0.7–4.0)
Monocytes Absolute: 0.8 10*3/uL (ref 0.1–1.0)
Monocytes Relative: 7 %
Neutro Abs: 9.6 10*3/uL — ABNORMAL HIGH (ref 1.7–7.7)
Neutrophils Relative %: 81 %

## 2022-07-09 LAB — ETHANOL: Alcohol, Ethyl (B): <10 mg/dL (ref <10)

## 2022-07-09 LAB — CBC
HCT: 29.5 % — ABNORMAL LOW (ref 36.0–46.0)
Hemoglobin: 9.5 g/dL — ABNORMAL LOW (ref 12.0–15.0)
MCH: 30 pg (ref 26.0–34.0)
MCHC: 32.2 g/dL (ref 30.0–36.0)
MCV: 93.1 fL (ref 80.0–100.0)
Platelets: 270 10*3/uL (ref 150–400)
RBC: 3.17 MIL/uL — ABNORMAL LOW (ref 3.87–5.11)
RDW: 13 % (ref 11.5–15.5)
WBC: 11.9 10*3/uL — ABNORMAL HIGH (ref 4.0–10.5)
nRBC: 0.3 % — ABNORMAL HIGH (ref 0.0–0.2)

## 2022-07-09 LAB — APTT: aPTT: 31 seconds (ref 24–36)

## 2022-07-09 LAB — SODIUM: Sodium: 133 mmol/L — ABNORMAL LOW (ref 135–145)

## 2022-07-09 LAB — PROTIME-INR
INR: 1.2 (ref 0.8–1.2)
Prothrombin Time: 15.4 seconds — ABNORMAL HIGH (ref 11.4–15.2)

## 2022-07-09 MED ORDER — MORPHINE BOLUS VIA INFUSION
5.0000 mg | INTRAVENOUS | Status: DC | PRN
  Administered 2022-07-10 – 2022-07-13 (×3): 5 mg via INTRAVENOUS

## 2022-07-09 MED ORDER — GLYCOPYRROLATE 0.2 MG/ML IJ SOLN
0.2000 mg | INTRAMUSCULAR | Status: DC | PRN
  Administered 2022-07-10 – 2022-07-11 (×2): 0.2 mg via INTRAVENOUS
  Filled 2022-07-09 (×2): qty 1

## 2022-07-09 MED ORDER — MORPHINE 100MG IN NS 100ML (1MG/ML) PREMIX INFUSION
0.0000 mg/h | INTRAVENOUS | Status: DC
  Administered 2022-07-09 – 2022-07-13 (×6): 5 mg/h via INTRAVENOUS
  Administered 2022-07-14: 6 mg/h via INTRAVENOUS
  Filled 2022-07-09 (×7): qty 100

## 2022-07-09 MED ORDER — ACETAMINOPHEN 650 MG RE SUPP
650.0000 mg | Freq: Four times a day (QID) | RECTAL | Status: DC | PRN
  Administered 2022-07-13: 650 mg via RECTAL
  Filled 2022-07-09: qty 1

## 2022-07-09 MED ORDER — STROKE: EARLY STAGES OF RECOVERY BOOK: Freq: Once | Status: DC

## 2022-07-09 MED ORDER — SODIUM CHLORIDE 3 % IV SOLN
INTRAVENOUS | Status: DC
  Filled 2022-07-09 (×2): qty 500

## 2022-07-09 MED ORDER — SODIUM CHLORIDE 0.9% FLUSH
3.0000 mL | Freq: Once | INTRAVENOUS | Status: AC
  Administered 2022-07-09: 3 mL via INTRAVENOUS

## 2022-07-09 MED ORDER — CLEVIDIPINE BUTYRATE 0.5 MG/ML IV EMUL: 0.0000 mg/h | INTRAVENOUS | Status: DC

## 2022-07-09 MED ORDER — POLYVINYL ALCOHOL 1.4 % OP SOLN: 1.0000 [drp] | Freq: Four times a day (QID) | OPHTHALMIC | Status: DC | PRN

## 2022-07-09 MED ORDER — ONDANSETRON HCL 4 MG PO TABS: 4.0000 mg | ORAL_TABLET | Freq: Four times a day (QID) | ORAL | Status: DC | PRN

## 2022-07-09 MED ORDER — ONDANSETRON HCL 4 MG/2ML IJ SOLN: 4.0000 mg | Freq: Four times a day (QID) | INTRAMUSCULAR | Status: DC | PRN

## 2022-07-09 MED ORDER — ACETAMINOPHEN 325 MG PO TABS: 650.0000 mg | ORAL_TABLET | Freq: Four times a day (QID) | ORAL | Status: DC | PRN

## 2022-07-09 MED ORDER — GLYCOPYRROLATE 1 MG PO TABS: 1.0000 mg | ORAL_TABLET | ORAL | Status: DC | PRN

## 2022-07-09 MED ORDER — SODIUM CHLORIDE 0.9 % IV SOLN: INTRAVENOUS | Status: DC

## 2022-07-09 MED ORDER — GLYCOPYRROLATE 0.2 MG/ML IJ SOLN
0.2000 mg | INTRAMUSCULAR | Status: DC | PRN
  Administered 2022-07-10 – 2022-07-11 (×5): 0.2 mg via SUBCUTANEOUS
  Filled 2022-07-09 (×5): qty 1

## 2022-07-09 MED ORDER — IOHEXOL 350 MG/ML SOLN
75.0000 mL | Freq: Once | INTRAVENOUS | Status: AC | PRN
  Administered 2022-07-09: 75 mL via INTRAVENOUS

## 2022-07-09 NOTE — ED Provider Notes (Signed)
[facility] Provider Note   Event Date/Time   First MD Initiated Contact with Patient 07/15/2022 1354     (approximate) History  Altered Mental Status  HPI Terry Watson is a 77 y.o. female with a past medical history of type 2 diabetes, depression, and hyperlipidemia who presents via EMS from [facility] with complaints of altered mental status.  According to staff, patient was alert, oriented, and walking with a walker after recent femur fracture at [place] until approximately 8:30 PM last night.  Patient was seen by her family this morning who found her to be more altered and with left-sided weakness as well as right-sided gaze and left-sided neglect.  Patient was also noted to have bruising to the left shoulder and left side of the neck.  Patient is unable to follow commands or provide any further history/review of systems    Physical Exam  Triage Vital Signs: ED Triage Vitals  Enc Vitals Group     BP 07/08/2022 1318 (!) 154/70     Pulse Rate 07/10/2022 1321 79     Resp 08/06/2022 1318 (!) 22     Temp 07/27/2022 1355 98.2 F (36.8 C)     Temp Source 07/30/2022 1355 Oral     SpO2 07/13/2022 1321 93 %     Weight --      Height --      Head Circumference --      Peak Flow --      Pain Score --      Pain Loc --      Pain Edu? --      Excl. in [place]? --    Most recent vital signs: Vitals:   07/11/2022 1500 07/30/2022 1515  BP: (!) 127/58 (!) 135/57  Pulse: 74 77  Resp: 18 17  Temp:    SpO2: 96% 94%   General: Awake, leaning to the right on stretcher CV:  Good peripheral perfusion.  Resp:  Normal effort.  Abd:  No distention.  Other:  Elderly cachectic Caucasian female leaning to the right on stretcher in no acute distress.  Flaccid paralysis to the left upper and lower extremity, left sided neglect ED Results / Procedures / Treatments  Labs (all labs ordered are listed, but only abnormal results are displayed) Labs Reviewed  PROTIME-INR - Abnormal;  Notable for the following components:      Result Value   Prothrombin Time 15.4 (*)    All other components within normal limits  CBC - Abnormal; Notable for the following components:   WBC 11.9 (*)    RBC 3.17 (*)    Hemoglobin 9.5 (*)    HCT 29.5 (*)    nRBC 0.3 (*)    All other components within normal limits  DIFFERENTIAL - Abnormal; Notable for the following components:   Neutro Abs 9.6 (*)    Abs Immature Granulocytes 0.49 (*)    All other components within normal limits  COMPREHENSIVE METABOLIC PANEL - Abnormal; Notable for the following components:   Glucose, Bld 317 (*)    Calcium 8.5 (*)    Total Protein 5.9 (*)    Albumin 2.4 (*)    All other components within normal limits  SODIUM - Abnormal; Notable for the following components:   Sodium 133 (*)    All other components within normal limits  APTT  ETHANOL  CBG MONITORING, ED   RADIOLOGY ED MD interpretation: CT of the head without contrast interpreted independently by  me shows a new large intraparenchymal hemorrhage centered in the right parietal lobe measuring up to 4.5 cm with extensive surrounding vasogenic edema -Agree with radiology assessment Official radiology report(s): CT ANGIO HEAD CODE STROKE  Result Date: 07/10/2022 CLINICAL DATA:  Intracranial hemorrhage. Evaluate for vascular malformation. EXAM: CT ANGIOGRAPHY HEAD TECHNIQUE: Multidetector CT imaging of the head was performed using the standard protocol during bolus administration of intravenous contrast. Multiplanar CT image reconstructions and MIPs were obtained to evaluate the vascular anatomy. RADIATION DOSE REDUCTION: This exam was performed according to the departmental dose-optimization program which includes automated exposure control, adjustment of the mA and/or kV according to patient size and/or use of iterative reconstruction technique. CONTRAST:  22m OMNIPAQUE IOHEXOL 350 MG/ML SOLN COMPARISON:  Same-day noncontrast CT head. FINDINGS: Anterior  circulation: There is calcified plaque in the carotid siphons resulting in mild-to-moderate stenosis on the left and no greater than mild stenosis on the right. The bilateral MCAs are patent with atherosclerotic irregularity and narrowing of the distal branches but no proximal high-grade stenosis or occlusion. The bilateral ACAs are patent, without proximal stenosis or occlusion. The anterior communicating artery is normal. There is no evidence of active extravasation into the right parietal intraparenchymal hematoma or the smaller occipital lobe hematoma. The hematomas appear grossly similar in size compared to the initial noncontrast head CT with unchanged 4 mm leftward midline shift. There is no culprit aneurysm or AVM identified. Posterior circulation: The bilateral V4 segments are patent. The basilar artery is patent but diminutive in caliber. The major cerebellar arteries appear patent. The bilateral PCAs are patent, primarily fed by prominent posterior communicating arteries bilaterally (fetal origins). There is no proximal stenosis or occlusion. There is no aneurysm or AVM. Venous sinuses: Not well evaluated due to bolus timing. Anatomic variants: As above. Review of the MIP images confirms the above findings. IMPRESSION: 1. No evidence of active extravasation into the right parietal or occipital lobe hematomas. 2. No culprit aneurysm or AVM identified. 3. Mild plaque in the carotid siphons resulting in mild-to-moderate stenosis on the left, and mild atherosclerotic irregularity and narrowing of the MCA branches. Electronically Signed   By: PValetta MoleM.D.   On: 07/15/2022 14:14   CT HEAD CODE STROKE WO CONTRAST  Result Date: 07/16/2022 CLINICAL DATA:  Code stroke. EXAM: CT HEAD WITHOUT CONTRAST TECHNIQUE: Contiguous axial images were obtained from the base of the skull through the vertex without intravenous contrast. RADIATION DOSE REDUCTION: This exam was performed according to the departmental  dose-optimization program which includes automated exposure control, adjustment of the mA and/or kV according to patient size and/or use of iterative reconstruction technique. COMPARISON:  CT Brain 07/02/22 FINDINGS: Brain: Compared to prior exam there is a new large parenchymal hemorrhage measuring up to 2.9 x 3.7 x 4.5 cm centered in the right parietal lobe with extensive surrounding vasogenic edema. There is an additional separate focus of hemorrhage in the right occipital lobe measuring 2.4 x 1.9 x 1.2 cm. There is also trace subarachnoid hemorrhage surrounding these regions of hemorrhage. Approximate 4 mm leftward midline shift. There is interval increase in size of the right temporal horn, which is worrisome for right ventricular entrapment. Redemonstrated are additional hypodense regions in the left parietal lobe and right cerebellum, favored to represent chronic infarcts. There is no extra-axial fluid collection. No intraventricular extension of blood products. Vascular: No hyperdense vessel or unexpected calcification. Skull: Normal. Negative for fracture or focal lesion. Sinuses/Orbits: Bilateral lens replacement. Right-sided scleral band. Mucosal thickening  right sphenoid sinus Other: None. IMPRESSION: 1. New large parenchymal hemorrhage centered in the right parietal lobe measuring up to 4.5 cm with extensive surrounding vasogenic edema. Additional separate focus of hemorrhage in the right occipital lobe measuring up to 2.4 cm. There is also surrounding small volume subarachnoid blood products. 2. Approximately 4 mm leftward midline shift with interval increase in size of the right temporal horn, worrisome for right ventricular entrapment. 3. Redemonstrated hypodense regions in the left parietal lobe and right cerebellum, favored to represent chronic infarcts. Findings were discussed with Dr. Quinn Axe and Dr. Cheri Fowler on 07/19/2022 at 1:27 PM. Electronically Signed   By: Marin Roberts M.D.   On: 07/20/2022 13:40    PROCEDURES: Critical Care performed: Yes, see critical care procedure note(s) .1-3 Lead EKG Interpretation  Performed by: Naaman Plummer, MD Authorized by: Naaman Plummer, MD     Interpretation: normal     ECG rate:  71   ECG rate assessment: normal     Rhythm: sinus rhythm     Ectopy: none     Conduction: normal   CRITICAL CARE Performed by: Naaman Plummer  Total critical care time: 57 minutes  Critical care time was exclusive of separately billable procedures and treating other patients.  Critical care was necessary to treat or prevent imminent or life-threatening deterioration.  Critical care was time spent personally by me on the following activities: development of treatment plan with patient and/or surrogate as well as nursing, discussions with consultants, evaluation of patient's response to treatment, examination of patient, obtaining history from patient or surrogate, ordering and performing treatments and interventions, ordering and review of laboratory studies, ordering and review of radiographic studies, pulse oximetry and re-evaluation of patient's condition.  MEDICATIONS ORDERED IN ED: Medications  acetaminophen (TYLENOL) tablet 650 mg (has no administration in time range)    Or  acetaminophen (TYLENOL) suppository 650 mg (has no administration in time range)  glycopyrrolate (ROBINUL) tablet 1 mg (has no administration in time range)    Or  glycopyrrolate (ROBINUL) injection 0.2 mg (has no administration in time range)    Or  glycopyrrolate (ROBINUL) injection 0.2 mg (has no administration in time range)  polyvinyl alcohol (LIQUIFILM TEARS) 1.4 % ophthalmic solution 1 drop (has no administration in time range)  morphine bolus via infusion 5 mg (has no administration in time range)  morphine '100mg'$  in NS 130m ('1mg'$ /mL) infusion - premix (5 mg/hr Intravenous New Bag/Given 07/23/2022 1532)  sodium chloride flush (NS) 0.9 % injection 3 mL (3 mLs Intravenous Given 07/18/2022  1347)  iohexol (OMNIPAQUE) 350 MG/ML injection 75 mL (75 mLs Intravenous Contrast Given 07/19/2022 1353)   IMPRESSION / MDM / ASSESSMENT AND PLAN / ED COURSE  I reviewed the triage vital signs and the nursing notes.                              Patient's presentation is most consistent with acute presentation with potential threat to life or bodily function. Stroke alert PMH risk factors: Hyperlipidemia, recent trauma Neurologic Deficits: Left upper and lower extremity paralysis, left-sided neglect Last known Well Time: 2030 07/08/2022 NIH Stroke Score: 22 Given History and Exam I have lower suspicion for infectious etiology, neurologic changes secondary to toxicologic ingestion, seizure, complex migraine. Presentation concerning for possible stroke requiring workup.  Workup: Labs: POC glucose, CBC, BMP, LFTs, Troponin, PT/INR, PTT, Type and Screen Other Diagnostics: ECG, CXR, non-contrast head CT followed  by CTA brain and neck (to r/o large vessel occlusion amenable to thrombectomy) Interventions: Patient low on NIH scale and out of the window for tpa.  Consult: Neurology. Discussed with Dr. Quinn Axe regarding patients neurological symptoms and last well-known time and eligibility for TPA criteria.   Discussed with family regarding their wishes regarding patient's significant intracranial hemorrhage and family has decided to place patient on comfort care at this time. Disposition: Comfort care   FINAL CLINICAL IMPRESSION(S) / ED DIAGNOSES   Final diagnoses:  Intraparenchymal hemorrhage of brain (HCC)  Altered mental status, unspecified altered mental status type   Rx / DC Orders   ED Discharge Orders     None      Note:  This document was prepared using Dragon voice recognition software and may include unintentional dictation errors.   Naaman Plummer, MD 08/04/2022 [phone]

## 2022-07-09 NOTE — Code Documentation (Signed)
Stroke Response Nurse Documentation Code Documentation  Terry Watson is a 77 y.o. female arriving to [facility] via [facility] on 07/08/2022 with past medical hx of DM, HLD, detached retina, dupuytren contracture, recently discharged from hospital for right hip fracture. On No antithrombotic. Code stroke was activated by ED.   Patient from [facility]  where she was LKW at 2030 07/13/2022 and now left side weakness, right gaze. Patient had a fall last night, found today by family with left sided weakness and right gaze.     Stroke team met EDP and care team in CT after Code Stroke activationl. Labs drawn and patient cleared for CT by Dr. Cheri Fowler. Patient to CT with team. NIHSS 25, see documentation for details and code stroke times. Patient with decreased LOC, disoriented, not following commands, right gaze preference , left hemianopia, left facial droop, left arm weakness, bilateral leg weakness, left decreased sensation, Receptive aphasia , and Visual  neglect on exam. The following imaging was completed:  CT Head. Patient is not a candidate for IV Thrombolytic due to hemorrhagic head bleed seen on imaging per MD. Patient is not a candidate for IR due to LVO not suspected, hemorrhagic bleed on imaging per MD.   Care Plan: Q1H NIHSS + vital signs, SBP goal 130-150 per order.   Bedside handoff with ED RN Caryl Pina.    Charise Carwin  Stroke Response RN
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
## 2022-07-09 NOTE — ED Notes (Signed)
CODE  STROKE  CALLED  TO  [facility]  
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
## 2022-07-09 NOTE — Progress Notes (Signed)
Patient presented with large intracranial hemorrhage. I had a long discussion regarding our findings and goals of care with her sons Shanon Brow and Octavia Bruckner. They do not wish to pursue any further aggressive medical measures and would like to change code status to DNR and initiate comfort care measures only. Appropriate orders placed. Full consult note to follow.  Su Monks, MD [facility] [phone]  If 7pm- 7am, please page neurology on call as listed in [place].

## 2022-07-09 NOTE — Progress Notes (Signed)
[place] 1.2.2023 2030 per tsrn notes 2904 off call, ICH  Taken from [place] log notes

## 2022-07-09 NOTE — Progress Notes (Signed)
  Chaplain On-Call responded to Code Stroke notification at 1312 hours for patient in ED room 2.  The patient is receiving care from the Stroke Team at bedside.  Chaplain is available for support if requested.  Chaplain Pollyann Samples M.Div., [facility]

## 2022-07-09 NOTE — ED Notes (Signed)
Tele-Neuro Cart activated at

## 2022-07-10 ENCOUNTER — Telehealth: Payer: Self-pay

## 2022-07-10 DIAGNOSIS — R29810 Facial weakness: Secondary | ICD-10-CM | POA: Diagnosis present

## 2022-07-10 DIAGNOSIS — Z515 Encounter for palliative care: Secondary | ICD-10-CM | POA: Diagnosis present

## 2022-07-10 DIAGNOSIS — S72001A Fracture of unspecified part of neck of right femur, initial encounter for closed fracture: Secondary | ICD-10-CM | POA: Diagnosis present

## 2022-07-10 DIAGNOSIS — R414 Neurologic neglect syndrome: Secondary | ICD-10-CM | POA: Diagnosis present

## 2022-07-10 DIAGNOSIS — I616 Nontraumatic intracerebral hemorrhage, multiple localized: Secondary | ICD-10-CM | POA: Diagnosis not present

## 2022-07-10 DIAGNOSIS — E119 Type 2 diabetes mellitus without complications: Secondary | ICD-10-CM | POA: Diagnosis present

## 2022-07-10 DIAGNOSIS — E785 Hyperlipidemia, unspecified: Secondary | ICD-10-CM | POA: Diagnosis present

## 2022-07-10 DIAGNOSIS — R29722 NIHSS score 22: Secondary | ICD-10-CM | POA: Diagnosis present

## 2022-07-10 DIAGNOSIS — G936 Cerebral edema: Secondary | ICD-10-CM | POA: Diagnosis present

## 2022-07-10 DIAGNOSIS — M810 Age-related osteoporosis without current pathological fracture: Secondary | ICD-10-CM | POA: Diagnosis present

## 2022-07-10 DIAGNOSIS — R5383 Other fatigue: Secondary | ICD-10-CM | POA: Diagnosis present

## 2022-07-10 DIAGNOSIS — Z961 Presence of intraocular lens: Secondary | ICD-10-CM | POA: Diagnosis present

## 2022-07-10 DIAGNOSIS — Z90710 Acquired absence of both cervix and uterus: Secondary | ICD-10-CM | POA: Diagnosis not present

## 2022-07-10 DIAGNOSIS — W06XXXA Fall from bed, initial encounter: Secondary | ICD-10-CM | POA: Diagnosis present

## 2022-07-10 DIAGNOSIS — F0390 Unspecified dementia without behavioral disturbance: Secondary | ICD-10-CM | POA: Diagnosis present

## 2022-07-10 DIAGNOSIS — R296 Repeated falls: Secondary | ICD-10-CM | POA: Diagnosis present

## 2022-07-10 DIAGNOSIS — R4701 Aphasia: Secondary | ICD-10-CM | POA: Diagnosis present

## 2022-07-10 DIAGNOSIS — F32A Depression, unspecified: Secondary | ICD-10-CM | POA: Diagnosis present

## 2022-07-10 DIAGNOSIS — S40012A Contusion of left shoulder, initial encounter: Secondary | ICD-10-CM | POA: Diagnosis present

## 2022-07-10 DIAGNOSIS — I611 Nontraumatic intracerebral hemorrhage in hemisphere, cortical: Secondary | ICD-10-CM | POA: Diagnosis present

## 2022-07-10 DIAGNOSIS — M720 Palmar fascial fibromatosis [Dupuytren]: Secondary | ICD-10-CM | POA: Diagnosis present

## 2022-07-10 DIAGNOSIS — Z66 Do not resuscitate: Secondary | ICD-10-CM | POA: Diagnosis present

## 2022-07-10 DIAGNOSIS — I619 Nontraumatic intracerebral hemorrhage, unspecified: Principal | ICD-10-CM | POA: Diagnosis present

## 2022-07-10 DIAGNOSIS — I10 Essential (primary) hypertension: Secondary | ICD-10-CM | POA: Diagnosis present

## 2022-07-10 DIAGNOSIS — R64 Cachexia: Secondary | ICD-10-CM | POA: Diagnosis present

## 2022-07-10 DIAGNOSIS — G8104 Flaccid hemiplegia affecting left nondominant side: Secondary | ICD-10-CM | POA: Diagnosis present

## 2022-07-10 DIAGNOSIS — R471 Dysarthria and anarthria: Secondary | ICD-10-CM | POA: Diagnosis present

## 2022-07-10 NOTE — Telephone Encounter (Signed)
Copied from [place] [phone]. Topic: General - Other >> Jul 10, 2022  3:11 PM Oley Balm A wrote: Reason for CRM: Terry Watson is calling to inform Dr. Brita Romp that pt is at [facility] in room 122, she had a stroke with brain bleed and is not expecting to make it.  Please advise Dr. Brita Romp

## 2022-07-10 NOTE — Progress Notes (Signed)
Patient with an increase in labored breathing, as well as some occasional muscle twitches and flickers. Given bolus from bag per orders.
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
## 2022-07-11 DIAGNOSIS — R9089 Other abnormal findings on diagnostic imaging of central nervous system: Secondary | ICD-10-CM | POA: Insufficient documentation

## 2022-07-11 DIAGNOSIS — I616 Nontraumatic intracerebral hemorrhage, multiple localized: Secondary | ICD-10-CM | POA: Diagnosis not present

## 2022-07-11 MED ORDER — GLYCOPYRROLATE 0.2 MG/ML IJ SOLN
0.2000 mg | INTRAMUSCULAR | Status: DC | PRN
  Administered 2022-07-11: 0.2 mg via SUBCUTANEOUS
  Filled 2022-07-11: qty 1

## 2022-07-11 MED ORDER — GLYCOPYRROLATE 1 MG PO TABS: 1.0000 mg | ORAL_TABLET | ORAL | Status: DC | PRN

## 2022-07-11 MED ORDER — SCOPOLAMINE 1 MG/3DAYS TD PT72
1.0000 | MEDICATED_PATCH | TRANSDERMAL | Status: DC
  Administered 2022-07-11 – 2022-07-14 (×2): 1.5 mg via TRANSDERMAL
  Filled 2022-07-11 (×2): qty 1

## 2022-07-11 MED ORDER — GLYCOPYRROLATE 0.2 MG/ML IJ SOLN
0.4000 mg | INTRAMUSCULAR | Status: DC | PRN
  Administered 2022-07-11 (×2): 0.4 mg via INTRAVENOUS
  Filled 2022-07-11 (×2): qty 2
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
## 2022-07-11 NOTE — Telephone Encounter (Signed)
Please thank the family for letting me know. I have been following along with the notes from the hospital. I am sorry for what they are experiencing and have kept the family in my thoughts during this time.

## 2022-07-11 NOTE — Telephone Encounter (Signed)
Spoke with Terry Watson and gave message.  She said she appreciate everything and wanted to cancel any upcoming appts.   Appt have been cancelled.

## 2022-07-11 NOTE — Progress Notes (Signed)
Nutrition Brief Note  Chart reviewed. Pt now transitioning to comfort care.  No further nutrition interventions planned at this time.  Please re-consult as needed.   Grete Bosko W, RD, LDN, CDCES Registered Dietitian II Certified Diabetes Care and Education Specialist Please refer to [facility] for RD and/or RD on-call/weekend/after hours pager   

## 2022-07-11 NOTE — Progress Notes (Signed)
Manufacturing engineer [facility]) Hospital Liaison Note  Received request from MD/Dr. Billie Ruddy requesting MSW contact family to provide education for Hospice Home transfer. MSW contacted son/David and he declines hospice services at this time reporting that family has opted for a hospital death. TOC/Gilda aware of above.  Please do not hesitate to call with any hospice related questions.    Thank you for the opportunity to participate in this patient's care.  Daphene Calamity, MSW [facility] Liaison  [phone]

## 2022-07-12 DIAGNOSIS — I616 Nontraumatic intracerebral hemorrhage, multiple localized: Secondary | ICD-10-CM | POA: Diagnosis not present
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
## 2022-07-13 DIAGNOSIS — I616 Nontraumatic intracerebral hemorrhage, multiple localized: Secondary | ICD-10-CM | POA: Diagnosis not present
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
## 2022-07-14 DIAGNOSIS — I616 Nontraumatic intracerebral hemorrhage, multiple localized: Secondary | ICD-10-CM | POA: Diagnosis not present

## 2022-07-17 DIAGNOSIS — Z515 Encounter for palliative care: Secondary | ICD-10-CM

## 2022-07-17 DIAGNOSIS — I619 Nontraumatic intracerebral hemorrhage, unspecified: Secondary | ICD-10-CM | POA: Insufficient documentation

## 2022-07-17 DIAGNOSIS — F0390 Unspecified dementia without behavioral disturbance: Secondary | ICD-10-CM | POA: Insufficient documentation

## 2022-08-07 NOTE — Progress Notes (Signed)
[id number]: Contacted by pt's daughter in law asking about status of death certificate and release to funeral home and if there would be an autopsy. Had concerns ME may not have some information about the patient situation leading up to her death. Called Brion Aliment, she stated she needed to speak with Dr. Billie Ruddy and would contact her this morning. Called DIL back and updated. Also explained pt would not have autopsy but would be an external examination. Told them I would call them as soon as I knew anything about the situation. Updated [facility] for reason of delay in releasing to their care.

## 2022-08-07 NOTE — Care Management Important Message (Signed)
Important Message  Patient Details  Name: Terry Watson MRN: [id number] Date of Birth: 13-Feb-1946   Medicare Important Message Given:  Other (see comment)  Patent is on comfort care measures and out of respect for the patient and family no Important Message from [facility] given.    [name] July 21, 2022, 9:38 AM
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
## 2022-08-07 NOTE — Discharge Summary (Addendum)
Death Summary  Terry Watson ATF:[id number] DOB: Aug 28, 1945 DOA: July 28, 2022  PCP: Virginia Crews, MD  Admit date: 2022/07/28 Date of Death: 2022-08-02 Time of Death: Oct 05, 1528  Notification: Virginia Crews, MD notified of death of 08/02/2022   History of present illness:  Terry Watson is a 77 y.o. female with a history of  Terry Watson presented with complaint of  Terry Watson did not improve after ***(very brief description of intervention) (type brief HPI or brief interim summary & hospital course depending on length of stay; Was pt made DNR/comfort care?)  Discharge Diagnoses:  Principal Problem:   ICH (intracerebral hemorrhage) ([place]) Active Problems:   Closed right hip fracture (HCC)   T2DM (type 2 diabetes mellitus) ([place])   Hypertension   Hyperlipidemia   History of stroke   Memory loss   Hemorrhagic stroke ([place])   Intraparenchymal hemorrhage of brain (HCC)   Midline shift of brain    The results of significant diagnostics from this hospitalization (including imaging, microbiology, ancillary and laboratory) are listed below for reference.    Significant Diagnostic Studies: CT ANGIO HEAD CODE STROKE  Result Date: 07/28/2022 CLINICAL DATA:  Intracranial hemorrhage. Evaluate for vascular malformation. EXAM: CT ANGIOGRAPHY HEAD TECHNIQUE: Multidetector CT imaging of the head was performed using the standard protocol during bolus administration of intravenous contrast. Multiplanar CT image reconstructions and MIPs were obtained to evaluate the vascular anatomy. RADIATION DOSE REDUCTION: This exam was performed according to the departmental dose-optimization program which includes automated exposure control, adjustment of the mA and/or kV according to patient size and/or use of iterative reconstruction technique. CONTRAST:  23m OMNIPAQUE IOHEXOL 350 MG/ML SOLN COMPARISON:  Same-day noncontrast CT head. FINDINGS: Anterior circulation: There is calcified plaque in the  carotid siphons resulting in mild-to-moderate stenosis on the left and no greater than mild stenosis on the right. The bilateral MCAs are patent with atherosclerotic irregularity and narrowing of the distal branches but no proximal high-grade stenosis or occlusion. The bilateral ACAs are patent, without proximal stenosis or occlusion. The anterior communicating artery is normal. There is no evidence of active extravasation into the right parietal intraparenchymal hematoma or the smaller occipital lobe hematoma. The hematomas appear grossly similar in size compared to the initial noncontrast head CT with unchanged 4 mm leftward midline shift. There is no culprit aneurysm or AVM identified. Posterior circulation: The bilateral V4 segments are patent. The basilar artery is patent but diminutive in caliber. The major cerebellar arteries appear patent. The bilateral PCAs are patent, primarily fed by prominent posterior communicating arteries bilaterally (fetal origins). There is no proximal stenosis or occlusion. There is no aneurysm or AVM. Venous sinuses: Not well evaluated due to bolus timing. Anatomic variants: As above. Review of the MIP images confirms the above findings. IMPRESSION: 1. No evidence of active extravasation into the right parietal or occipital lobe hematomas. 2. No culprit aneurysm or AVM identified. 3. Mild plaque in the carotid siphons resulting in mild-to-moderate stenosis on the left, and mild atherosclerotic irregularity and narrowing of the MCA branches. Electronically Signed   By: PValetta MoleM.D.   On: 001/22/2414:14   CT HEAD CODE STROKE WO CONTRAST  Result Date: 101-22-2024CLINICAL DATA:  Code stroke. EXAM: CT HEAD WITHOUT CONTRAST TECHNIQUE: Contiguous axial images were obtained from the base of the skull through the vertex without intravenous contrast. RADIATION DOSE REDUCTION: This exam was performed according to the departmental dose-optimization program which includes automated  exposure control, adjustment of the  mA and/or kV according to patient size and/or use of iterative reconstruction technique. COMPARISON:  CT Brain 07/02/22 FINDINGS: Brain: Compared to prior exam there is a new large parenchymal hemorrhage measuring up to 2.9 x 3.7 x 4.5 cm centered in the right parietal lobe with extensive surrounding vasogenic edema. There is an additional separate focus of hemorrhage in the right occipital lobe measuring 2.4 x 1.9 x 1.2 cm. There is also trace subarachnoid hemorrhage surrounding these regions of hemorrhage. Approximate 4 mm leftward midline shift. There is interval increase in size of the right temporal horn, which is worrisome for right ventricular entrapment. Redemonstrated are additional hypodense regions in the left parietal lobe and right cerebellum, favored to represent chronic infarcts. There is no extra-axial fluid collection. No intraventricular extension of blood products. Vascular: No hyperdense vessel or unexpected calcification. Skull: Normal. Negative for fracture or focal lesion. Sinuses/Orbits: Bilateral lens replacement. Right-sided scleral band. Mucosal thickening right sphenoid sinus Other: None. IMPRESSION: 1. New large parenchymal hemorrhage centered in the right parietal lobe measuring up to 4.5 cm with extensive surrounding vasogenic edema. Additional separate focus of hemorrhage in the right occipital lobe measuring up to 2.4 cm. There is also surrounding small volume subarachnoid blood products. 2. Approximately 4 mm leftward midline shift with interval increase in size of the right temporal horn, worrisome for right ventricular entrapment. 3. Redemonstrated hypodense regions in the left parietal lobe and right cerebellum, favored to represent chronic infarcts. Findings were discussed with Dr. Quinn Axe and Dr. Cheri Fowler on 07/23/2022 at 1:27 PM. Electronically Signed   By: Marin Roberts M.D.   On: 07/13/2022 13:40   DG HIP UNILAT WITH PELVIS 2-3 VIEWS  RIGHT  Result Date: 07/03/2022 CLINICAL DATA:  Intramedullary nail. EXAM: DG HIP (WITH OR WITHOUT PELVIS) 2-3V RIGHT COMPARISON:  07/02/2022 FINDINGS: Three images are submitted, showing intramedullary nail and lag screw transfixing intertrochanteric fracture of the RIGHT hip. IMPRESSION: Intramedullary nail and lag screw transfixing intertrochanteric fracture of the RIGHT hip. ORIF of the RIGHT hip. Electronically Signed   By: Nolon Nations M.D.   On: 07/03/2022 16:27   DG C-Arm 1-60 Min-No Report  Result Date: 07/03/2022 Fluoroscopy was utilized by the requesting physician.  No radiographic interpretation.   DG HIP UNILAT WITH PELVIS 2-3 VIEWS RIGHT  Result Date: 07/02/2022 CLINICAL DATA:  10176. Fall injury. Suspected right hip fracture. History of left hip surgery. EXAM: DG HIP (WITH OR WITHOUT PELVIS) 2-3V RIGHT; PORTABLE CHEST - 1 VIEW COMPARISON:  None Available. FINDINGS: Chest AP portable: The cardiomediastinal silhouette and vasculature are normal apart from calcifications in the aortic arch. The lungs are clear. The sulci are sharp. There is osteopenia and slight thoracic dextroscoliosis. There is artifact from overlying clothing. The thoracic cage grossly intact. AP pelvis and AP and cross-table lateral right hip views: Generalized osteopenia. Comminuted intertrochanteric proximal right femoral fracture is noted, with the lesser trochanter separately fractured and medially displaced and with the main distal fragment impacted and mildly cephalad displaced, also anteriorly displaced by about 1/3 of a shaft width. There is an irregular sclerotic lesion in the proximal right femoral shaft just below the level of the fracture, which could be a chondroid lesion or old bone infarct. Sclerotic metastasis not strictly excluded. There is normal spacing at the right hip, SI joints, symphysis pubis. There is moderate degenerative arthrosis at the left hip with mild femoral head flattening consistent  with AVN. A left femoral intramedullary rod is partially visible. The visualized portion without evidence  of loosening. There are pelvic phleboliths. No other significant soft tissue findings. Mild-to-moderate fecal stasis. IMPRESSION: 1. No evidence of acute chest disease.  Aortic atherosclerosis. 2. Comminuted intertrochanteric proximal right femoral fracture, with the main distal fragment impacted and anteriorly displaced by about 1/3 of a shaft width. 3. Irregular sclerotic lesion in the proximal right femoral shaft, probably either a chondroid lesion or old bone infarct. Sclerotic metastasis not strictly excluded. 4. Left hip AVN and secondary DJD. Electronically Signed   By: Telford Nab M.D.   On: 07/02/2022 23:33   DG Chest Port 1 View  Result Date: 07/02/2022 CLINICAL DATA:  10176. Fall injury. Suspected right hip fracture. History of left hip surgery. EXAM: DG HIP (WITH OR WITHOUT PELVIS) 2-3V RIGHT; PORTABLE CHEST - 1 VIEW COMPARISON:  None Available. FINDINGS: Chest AP portable: The cardiomediastinal silhouette and vasculature are normal apart from calcifications in the aortic arch. The lungs are clear. The sulci are sharp. There is osteopenia and slight thoracic dextroscoliosis. There is artifact from overlying clothing. The thoracic cage grossly intact. AP pelvis and AP and cross-table lateral right hip views: Generalized osteopenia. Comminuted intertrochanteric proximal right femoral fracture is noted, with the lesser trochanter separately fractured and medially displaced and with the main distal fragment impacted and mildly cephalad displaced, also anteriorly displaced by about 1/3 of a shaft width. There is an irregular sclerotic lesion in the proximal right femoral shaft just below the level of the fracture, which could be a chondroid lesion or old bone infarct. Sclerotic metastasis not strictly excluded. There is normal spacing at the right hip, SI joints, symphysis pubis. There is moderate  degenerative arthrosis at the left hip with mild femoral head flattening consistent with AVN. A left femoral intramedullary rod is partially visible. The visualized portion without evidence of loosening. There are pelvic phleboliths. No other significant soft tissue findings. Mild-to-moderate fecal stasis. IMPRESSION: 1. No evidence of acute chest disease.  Aortic atherosclerosis. 2. Comminuted intertrochanteric proximal right femoral fracture, with the main distal fragment impacted and anteriorly displaced by about 1/3 of a shaft width. 3. Irregular sclerotic lesion in the proximal right femoral shaft, probably either a chondroid lesion or old bone infarct. Sclerotic metastasis not strictly excluded. 4. Left hip AVN and secondary DJD. Electronically Signed   By: Telford Nab M.D.   On: 07/02/2022 23:33   CT Head Wo Contrast  Result Date: 07/02/2022 CLINICAL DATA:  Head and neck trauma. Patient arrives from assisted living facility post unwitnessed fall. History of dementia. EXAM: CT HEAD WITHOUT CONTRAST CT CERVICAL SPINE WITHOUT CONTRAST TECHNIQUE: Multidetector CT imaging of the head and cervical spine was performed following the standard protocol without intravenous contrast. Multiplanar CT image reconstructions of the cervical spine were also generated. RADIATION DOSE REDUCTION: This exam was performed according to the departmental dose-optimization program which includes automated exposure control, adjustment of the mA and/or kV according to patient size and/or use of iterative reconstruction technique. COMPARISON:  MR head dated September 23, 2019 FINDINGS: CT HEAD FINDINGS Brain: No evidence of acute infarction, hemorrhage, hydrocephalus, extra-axial collection or mass lesion/mass effect. Generalized cerebral atrophy. Patchy area of low-attenuation of the periventricular white matter presumed advanced chronic microvascular ischemic changes. Old right parieto-occipital and right cerebellar infarct,  unchanged. Vascular: No hyperdense vessel or unexpected calcification. Skull: Normal. Negative for fracture or focal lesion. Sinuses/Orbits: No acute finding.  Right scleral band. Other: None. CT CERVICAL SPINE FINDINGS Alignment: Normal. Skull base and vertebrae: No acute fracture. No primary bone  lesion or focal pathologic process. Soft tissues and spinal canal: No prevertebral fluid or swelling. No visible canal hematoma. Disc levels: C2-C3:  Mild bilateral facet joint arthropathy. C3-C4: Disc height loss and uncovertebral joint arthropathy. Moderate bilateral facet joint arthropathy. No significant neural foraminal stenosis. C4-C5: Moderate bilateral facet joint arthropathy. No significant spinal canal or neural foraminal stenosis. C5-C6: Disc height loss and uncovertebral joint arthropathy with moderate left neural foraminal stenosis. Moderate bilateral facet joint arthropathy. C6-C7: Disc height loss and uncovertebral joint arthropathy. Mild bilateral neural foraminal stenosis. C7-T1:  No significant finding.  Mild right facet joint arthropathy. Upper chest: Negative. Other: None IMPRESSION: CT HEAD: 1. No acute intracranial abnormality. 2. Generalized cerebral atrophy and advanced chronic microvascular ischemic changes of the white matter. 3. 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 CT CERVICAL SPINE: 1. No acute fracture or subluxation. 2. Moderate multilevel degenerative disc and facet joint disease. Electronically Signed   By: Keane Police D.O.   On: 07/02/2022 23:09   CT Cervical Spine Wo Contrast  Result Date: 07/02/2022 CLINICAL DATA:  Head and neck trauma. Patient arrives from assisted living facility post unwitnessed fall. History of dementia. EXAM: CT HEAD WITHOUT CONTRAST CT CERVICAL SPINE WITHOUT CONTRAST TECHNIQUE: Multidetector CT imaging of the head and cervical spine was performed following the standard protocol without intravenous contrast. Multiplanar CT image  reconstructions of the cervical spine were also generated. RADIATION DOSE REDUCTION: This exam was performed according to the departmental dose-optimization program which includes automated exposure control, adjustment of the mA and/or kV according to patient size and/or use of iterative reconstruction technique. COMPARISON:  MR head dated September 23, 2019 FINDINGS: CT HEAD FINDINGS Brain: No evidence of acute infarction, hemorrhage, hydrocephalus, extra-axial collection or mass lesion/mass effect. Generalized cerebral atrophy. Patchy area of low-attenuation of the periventricular white matter presumed advanced chronic microvascular ischemic changes. 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 Vascular: No hyperdense vessel or unexpected calcification. Skull: Normal. Negative for fracture or focal lesion. Sinuses/Orbits: No acute finding.  Right scleral band. Other: None. CT CERVICAL SPINE FINDINGS Alignment: Normal. Skull base and vertebrae: No acute fracture. No primary bone lesion or focal pathologic process. Soft tissues and spinal canal: No prevertebral fluid or swelling. No visible canal hematoma. Disc levels: C2-C3:  Mild bilateral facet joint arthropathy. C3-C4: Disc height loss and uncovertebral joint arthropathy. Moderate bilateral facet joint arthropathy. No significant neural foraminal stenosis. C4-C5: Moderate bilateral facet joint arthropathy. No significant spinal canal or neural foraminal stenosis. C5-C6: Disc height loss and uncovertebral joint arthropathy with moderate left neural foraminal stenosis. Moderate bilateral facet joint arthropathy. C6-C7: Disc height loss and uncovertebral joint arthropathy. Mild bilateral neural foraminal stenosis. C7-T1:  No significant finding.  Mild right facet joint arthropathy. Upper chest: Negative. Other: None IMPRESSION: CT HEAD: 1. No acute intracranial abnormality. 2. Generalized cerebral atrophy and advanced chronic microvascular  ischemic changes of the white matter. 3. 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 CT CERVICAL SPINE: 1. No acute fracture or subluxation. 2. Moderate multilevel degenerative disc and facet joint disease. Electronically Signed   By: Keane Police D.O.   On: 07/02/2022 23:09    Microbiology: No results found for this or any previous visit (from the past 240 hour(s)).   Labs: Basic Metabolic Panel: Recent Labs  Lab 07/12/2022 1309  NA 133*  135  K 4.4  CL 102  CO2 25  GLUCOSE 317*  BUN 16  CREATININE 0.59  CALCIUM 8.5*   Liver  Function Tests: Recent Labs  Lab 07/08/2022 1309  AST 35  ALT 34  ALKPHOS 55  BILITOT 1.2  PROT 5.9*  ALBUMIN 2.4*   No results for input(s): "LIPASE", "AMYLASE" in the last 168 hours. No results for input(s): "AMMONIA" in the last 168 hours. CBC: Recent Labs  Lab 07/13/2022 1309  WBC 11.9*  NEUTROABS 9.6*  HGB 9.5*  HCT 29.5*  MCV 93.1  PLT 270   Cardiac Enzymes: No results for input(s): "CKTOTAL", "CKMB", "CKMBINDEX", "TROPONINI" in the last 168 hours. D-Dimer No results for input(s): "DDIMER" in the last 72 hours. BNP: Invalid input(s): "POCBNP" CBG: Recent Labs  Lab 07/07/22 2111 07/08/22 0802 07/08/22 1132  GLUCAP 278* 259* 409*   Anemia work up No results for input(s): "VITAMINB12", "FOLATE", "FERRITIN", "TIBC", "IRON", "RETICCTPCT" in the last 72 hours. Urinalysis    Component Value Date/Time   COLORURINE YELLOW (A) 07/03/2022 0057   APPEARANCEUR HAZY (A) 07/03/2022 0057   APPEARANCEUR CANCELED 05/23/2022 1127   LABSPEC 1.016 07/03/2022 0057   PHURINE 6.0 07/03/2022 0057   GLUCOSEU >=500 (A) 07/03/2022 0057   HGBUR NEGATIVE 07/03/2022 0057   BILIRUBINUR NEGATIVE 07/03/2022 0057   BILIRUBINUR CANCELED 05/23/2022 1127   KETONESUR 20 (A) 07/03/2022 0057   PROTEINUR 30 (A) 07/03/2022 0057   UROBILINOGEN 0.2 02/21/2022 1528   NITRITE POSITIVE (A) 07/03/2022 0057   LEUKOCYTESUR TRACE (A) 07/03/2022  0057   Sepsis Labs Recent Labs  Lab 07/31/2022 1309  WBC 11.9*       SIGNED:  Enzo Bi, MD  [facility] Jul 27, 2022, 4:58 PM Pager   If 7PM-7AM, please contact night-coverage [email or web address] Password [email or web address]

## 2022-08-07 DEATH — deceased
